# Patient Record
Sex: Male | Born: 1935 | Race: White | Hispanic: No | Marital: Married | Smoking: Former smoker
Health system: Southern US, Community
[De-identification: ages and names within clinical notes are randomized; demographics above are authoritative.]

## PROBLEM LIST (undated history)

## (undated) DIAGNOSIS — E785 Hyperlipidemia, unspecified: Secondary | ICD-10-CM

## (undated) DIAGNOSIS — Z87442 Personal history of urinary calculi: Secondary | ICD-10-CM

## (undated) DIAGNOSIS — I509 Heart failure, unspecified: Secondary | ICD-10-CM

## (undated) DIAGNOSIS — I2510 Atherosclerotic heart disease of native coronary artery without angina pectoris: Secondary | ICD-10-CM

## (undated) HISTORY — PX: HEMORRHOID SURGERY: SHX153

## (undated) HISTORY — DX: Hyperlipidemia, unspecified: E78.5

## (undated) HISTORY — PX: KIDNEY STONE SURGERY: SHX686

## (undated) HISTORY — PX: CARDIAC CATHETERIZATION: SHX172

## (undated) HISTORY — DX: Atherosclerotic heart disease of native coronary artery without angina pectoris: I25.10

---

## 2001-04-30 ENCOUNTER — Ambulatory Visit (HOSPITAL_COMMUNITY): Admission: RE | Admit: 2001-04-30 | Discharge: 2001-05-01 | Payer: Self-pay | Admitting: Cardiology

## 2001-04-30 ENCOUNTER — Encounter: Payer: Self-pay | Admitting: Cardiology

## 2002-04-14 ENCOUNTER — Emergency Department (HOSPITAL_COMMUNITY): Admission: EM | Admit: 2002-04-14 | Discharge: 2002-04-14 | Payer: Self-pay | Admitting: Emergency Medicine

## 2002-04-14 ENCOUNTER — Encounter: Payer: Self-pay | Admitting: Emergency Medicine

## 2002-04-14 ENCOUNTER — Encounter: Payer: Self-pay | Admitting: Orthopedic Surgery

## 2004-12-01 ENCOUNTER — Ambulatory Visit: Payer: Self-pay | Admitting: Family Medicine

## 2005-06-07 ENCOUNTER — Ambulatory Visit: Payer: Self-pay | Admitting: Cardiology

## 2005-06-12 ENCOUNTER — Ambulatory Visit: Payer: Self-pay

## 2005-11-12 ENCOUNTER — Ambulatory Visit: Payer: Self-pay | Admitting: Family Medicine

## 2006-06-10 ENCOUNTER — Ambulatory Visit: Payer: Self-pay | Admitting: Cardiology

## 2007-06-17 ENCOUNTER — Ambulatory Visit: Payer: Self-pay | Admitting: Cardiology

## 2008-06-17 ENCOUNTER — Ambulatory Visit: Payer: Self-pay | Admitting: Cardiology

## 2008-10-06 ENCOUNTER — Ambulatory Visit: Payer: Self-pay | Admitting: Cardiology

## 2009-02-22 DIAGNOSIS — I2510 Atherosclerotic heart disease of native coronary artery without angina pectoris: Secondary | ICD-10-CM | POA: Insufficient documentation

## 2009-02-22 DIAGNOSIS — E785 Hyperlipidemia, unspecified: Secondary | ICD-10-CM | POA: Insufficient documentation

## 2009-02-23 ENCOUNTER — Encounter: Payer: Self-pay | Admitting: Cardiology

## 2009-02-23 ENCOUNTER — Ambulatory Visit: Payer: Self-pay | Admitting: Cardiology

## 2009-02-23 DIAGNOSIS — I440 Atrioventricular block, first degree: Secondary | ICD-10-CM | POA: Insufficient documentation

## 2009-09-02 ENCOUNTER — Ambulatory Visit: Payer: Self-pay | Admitting: Cardiology

## 2010-03-16 ENCOUNTER — Ambulatory Visit: Payer: Self-pay | Admitting: Cardiology

## 2010-05-07 ENCOUNTER — Inpatient Hospital Stay (HOSPITAL_COMMUNITY): Admission: RE | Admit: 2010-05-07 | Discharge: 2010-05-13 | Payer: Self-pay | Admitting: Interventional Cardiology

## 2010-05-07 ENCOUNTER — Ambulatory Visit: Payer: Self-pay | Admitting: Internal Medicine

## 2010-05-08 ENCOUNTER — Ambulatory Visit: Payer: Self-pay | Admitting: Internal Medicine

## 2010-05-08 ENCOUNTER — Encounter: Payer: Self-pay | Admitting: Cardiology

## 2010-05-24 ENCOUNTER — Ambulatory Visit: Payer: Self-pay | Admitting: Cardiology

## 2010-05-29 ENCOUNTER — Ambulatory Visit: Payer: Self-pay | Admitting: Cardiology

## 2010-05-29 DIAGNOSIS — I2129 ST elevation (STEMI) myocardial infarction involving other sites: Secondary | ICD-10-CM | POA: Insufficient documentation

## 2010-05-30 LAB — CONVERTED CEMR LAB
BUN: 30 mg/dL — ABNORMAL HIGH (ref 6–23)
CO2: 29 meq/L (ref 19–32)
Calcium: 9.1 mg/dL (ref 8.4–10.5)
Chloride: 108 meq/L (ref 96–112)
Creatinine, Ser: 1.4 mg/dL (ref 0.4–1.5)
GFR calc non Af Amer: 51.03 mL/min (ref >60)
Glucose, Bld: 96 mg/dL (ref 70–99)
Potassium: 5 meq/L (ref 3.5–5.1)
Sodium: 139 meq/L (ref 135–145)

## 2010-06-07 ENCOUNTER — Ambulatory Visit: Payer: Self-pay | Admitting: Cardiology

## 2010-06-07 LAB — CONVERTED CEMR LAB
BUN: 27 mg/dL — ABNORMAL HIGH (ref 6–23)
CO2: 27 meq/L (ref 19–32)
Calcium: 9.2 mg/dL (ref 8.4–10.5)
Chloride: 99 meq/L (ref 96–112)
Creatinine, Ser: 1.4 mg/dL (ref 0.4–1.5)
GFR calc non Af Amer: 51.45 mL/min (ref >60)
Glucose, Bld: 93 mg/dL (ref 70–99)
Potassium: 5 meq/L (ref 3.5–5.1)
Sodium: 135 meq/L (ref 135–145)

## 2010-06-12 ENCOUNTER — Telehealth (INDEPENDENT_AMBULATORY_CARE_PROVIDER_SITE_OTHER): Payer: Self-pay | Admitting: *Deleted

## 2010-06-13 ENCOUNTER — Telehealth: Payer: Self-pay | Admitting: Cardiology

## 2010-06-13 LAB — CONVERTED CEMR LAB
BUN: 18 mg/dL (ref 6–23)
Chloride: 102 meq/L (ref 96–112)
Glucose, Bld: 132 mg/dL — ABNORMAL HIGH (ref 70–99)
Potassium: 3.6 meq/L (ref 3.5–5.1)

## 2010-06-23 ENCOUNTER — Telehealth: Payer: Self-pay | Admitting: Cardiology

## 2010-06-29 ENCOUNTER — Ambulatory Visit: Payer: Self-pay | Admitting: Cardiology

## 2010-07-03 LAB — CONVERTED CEMR LAB
BUN: 17 mg/dL (ref 6–23)
CO2: 29 meq/L (ref 19–32)
Calcium: 9.1 mg/dL (ref 8.4–10.5)
Creatinine, Ser: 1.2 mg/dL (ref 0.4–1.5)
Glucose, Bld: 86 mg/dL (ref 70–99)

## 2010-07-14 ENCOUNTER — Telehealth: Payer: Self-pay | Admitting: Cardiology

## 2010-08-03 ENCOUNTER — Ambulatory Visit: Payer: Self-pay | Admitting: Cardiology

## 2010-08-08 LAB — CONVERTED CEMR LAB
BUN: 18 mg/dL (ref 6–23)
CO2: 28 meq/L (ref 19–32)
Calcium: 9.3 mg/dL (ref 8.4–10.5)
Chloride: 106 meq/L (ref 96–112)
Creatinine, Ser: 1.3 mg/dL (ref 0.4–1.5)
GFR calc non Af Amer: 57.6 mL/min (ref >60)
Glucose, Bld: 81 mg/dL (ref 70–99)
Potassium: 3.7 meq/L (ref 3.5–5.1)
Sodium: 141 meq/L (ref 135–145)

## 2010-08-29 ENCOUNTER — Encounter: Payer: Self-pay | Admitting: Cardiology

## 2010-09-05 ENCOUNTER — Encounter: Payer: Self-pay | Admitting: Cardiology

## 2010-10-05 ENCOUNTER — Ambulatory Visit: Payer: Self-pay | Admitting: Cardiology

## 2010-12-05 NOTE — Progress Notes (Signed)
Summary: Medications  Phone Note Call from Patient Call back at [phone]   Caller: Granddaughter/Stephaine Summary of Call: Request call Initial call taken by: Judie Grieve,  June 13, 2010 3:14 PM  Follow-up for Phone Call        Left message to call back. Julieta Gutting, RN, BSN  June 13, 2010 3:36 PM  Lauren Have him stop his very low dose of metoprolol.  Thanks. TS  I spoke with Judeth Cornfield and made her aware that Dr Riley Kill would like the pt to stop Metoprolol ER and Crestor.  The pt was taking Crestor 10mg  at discharge and Dr Riley Kill would like the pt to start Simvastatin 20mg  daily (due to cost).  The pt's current medications should be Simvastatin 20mg  qhs, ASA 81mg  daily, Plavix 75mg  daily, Potassium daily and Furosemide 20mg  one-half tablet daily.  I spoke with Stou Scientist, research (physical sciences)) at [facility] and called in all new presciptions for the pt's medications.  Order for Crestor and Metoprolol ER discontinued.  Follow-up by: Julieta Gutting, RN, BSN,  June 13, 2010 4:14 PM    New/Updated Medications: POTASSIUM CHLORIDE CR 10 MEQ CR-CAPS (POTASSIUM CHLORIDE) Take one tablet by mouth daily SIMVASTATIN 20 MG TABS (SIMVASTATIN) Take one tablet by mouth daily at bedtime

## 2010-12-05 NOTE — Assessment & Plan Note (Signed)
Summary: f75m   Visit Type:  Follow-up Primary Provider:  Nyland  CC:  No complaints.  History of Present Illness: Patient is not as energetic as he wants to be.  He says he has gotten lazy.  If he gets out and does to much he gets worn down.  He tries not to be strenuous with what he does.  Labs reviewed from Dr. Rinaldo Cloud office in detail.  Denies current symptoms other than fatigue.  Problems Prior to Update: 1)  Acut Mi Oth Lat Wall Subsqt Epis Care  (ICD-410.52) 2)  Atrioventricular Block, Mobitz Type I  (ICD-426.13) 3)  Cad  (ICD-414.00) 4)  Hyperlipidemia  (ICD-272.4)  Current Medications (verified): 1)  Aspirin 81 Mg Tbec (Aspirin) .... Take One Tablet By Mouth Daily 2)  Plavix 75 Mg Tabs (Clopidogrel Bisulfate) .Marland Kitchen.. 1 Tab Once Daily 3)  Furosemide 20 Mg Tabs (Furosemide) .... Take One-Half Tablet Once Daily 4)  Potassium Chloride Cr 10 Meq Cr-Caps (Potassium Chloride) .... Take One Tablet By Mouth Daily 5)  Simvastatin 20 Mg Tabs (Simvastatin) .... Take One Tablet By Mouth Daily At Bedtime  Allergies (verified): No Known Drug Allergies  Past History:  Past Medical History: Last updated: 03-09-09 :  CAD (ICD-414.00)-Non obstructive HYPERLIPIDEMIA (ICD-272.4)  Past Surgical History: Last updated: 2009-03-09 PAST SURGERY:  Included a procedure to remove a kidney stone and a hemorrhoid procedure in the 1970s.  He had a past coronary artery angiography by Dr. Riley Kill.  Family History: Last updated: Mar 09, 2009 Mother died at age 30. Father died at age 37. He had an MI in his 72's. He had one brother with cancer. He had a sister with cancer. A brother who is 95 and had a pacemaker put in at 70.  Social History: Last updated: 2009/03/09 He is married. He has one son. He does not smoke Alcohol Use - no  Vital Signs:  Patient profile:   77 year old male Height:      66 inches Weight:      174.25 pounds BMI:     28.23 Pulse rate:   60 / minute Pulse rhythm:    irregular Resp:     18 per minute BP sitting:   125 / 69  (right arm) Cuff size:   large  Vitals Entered By: Vikki Ports (October 05, 2010 3:09 PM)  Physical Exam  General:  Well developed, well nourished, in no acute distress. Head:  normocephalic and atraumatic Eyes:  PERRLA/EOM intact; conjunctiva and lids normal. Neck:  Mild elevation in JVP. Lungs:  Clear bilaterally to auscultation and percussion. Heart:  PMI non displaced.  Normal S1 and S2.  Pos S4. Pulses:  pulses normal in all 4 extremities Extremities:  No clubbing or cyanosis.   Impression & Recommendations:  Problem # 1:  CAD (ICD-414.00) stable at present.  Would not change meds at this point.   His updated medication list for this problem includes:    Aspirin 81 Mg Tbec (Aspirin) .Marland Kitchen... Take one tablet by mouth daily    Plavix 75 Mg Tabs (Clopidogrel bisulfate) .Marland Kitchen... 1 tab once daily  Orders: EKG w/ Interpretation (93000)  Problem # 2:  HYPERLIPIDEMIA (ICD-272.4) tolerating.  Near target with LDL of 79.  Continue same. His updated medication list for this problem includes:    Simvastatin 20 Mg Tabs (Simvastatin) .Marland Kitchen... Take one tablet by mouth daily at bedtime  Patient Instructions: 1)  Your physician recommends that you schedule a follow-up appointment in: 3 months 2)  Your physician recommends that you continue on your current medications as directed. Please refer to the Current Medication list given to you today.     

## 2010-12-05 NOTE — Assessment & Plan Note (Signed)
Summary: EPH   Visit Type:  EPH Primary Provider:  Nyland  CC:  no cardiac complaints today.  History of Present Illness: Not having any chest pain.  Presented with an MI on July 4.  Feels a little sick to the stomach from time to time, and he has had a cough, and feels somewhat week.  It is mainly clearing his throat.  Does not want to go to the rehab program.  Cath films reviewed with patient and wife in detail.  No current pain.   Current Medications (verified): 1)  Aspirin Ec 325 Mg Tbec (Aspirin) .... Take One Tablet By Mouth Daily 2)  Plavix 75 Mg Tabs (Clopidogrel Bisulfate) .Marland Kitchen.. 1 Tab Once Daily 3)  Furosemide 20 Mg Tabs (Furosemide) .Marland Kitchen.. 1 Tab Once Daily 4)  Lisinopril 5 Mg Tabs (Lisinopril) .Marland Kitchen.. 1 Tab Once Daily 5)  Potassium Chloride Crys Cr 20 Meq Cr-Tabs (Potassium Chloride Crys Cr) .Marland Kitchen.. 1 Tab Once Daily 6)  Nitrostat 0.4 Mg Subl (Nitroglycerin) .Marland Kitchen.. 1 Tablet Under Tongue At Onset of Chest Pain; You May Repeat Every 5 Minutes For Up To 3 Doses. 7)  Crestor 10 Mg Tabs (Rosuvastatin Calcium) .Marland Kitchen.. 1 Tab At Bedtime 8)  Toprol Xl 25 Mg Xr24h-Tab (Metoprolol Succinate) .Marland Kitchen.. 1 Tab Once Daily  Allergies (verified): No Known Drug Allergies  Past History:  Past Medical History: Last updated: 02/22/2009 :  CAD (ICD-414.00)-Non obstructive HYPERLIPIDEMIA (ICD-272.4)  Vital Signs:  Patient profile:   77 year old male Height:      66 inches Weight:      169 pounds BMI:     27.38 Pulse rate:   46 / minute Pulse rhythm:   irregular BP sitting:   100 / 62  (left arm) Cuff size:   regular  Vitals Entered By: Danielle Rankin, CMA (May 29, 2010 10:21 AM)  Physical Exam  General:  Well developed, well nourished, in no acute distress. Head:  normocephalic and atraumatic Eyes:  PERRLA/EOM intact; conjunctiva and lids normal. Lungs:  Clear bilaterally to auscultation and percussion. Heart:  PMI non displaced.  Normal S1 and S2.  Pos S4.   Abdomen:  Bowel sounds positive; abdomen  soft and non-tender without masses, organomegaly, or hernias noted. No hepatosplenomegaly. Pulses:  pulses normal in all 4 extremities Extremities:  No clubbing or cyanosis. Neurologic:  Alert and oriented x 3.   Cardiac Cath  Procedure date:  05/08/2010  Findings:      IMPRESSION: 1. Acute anterolateral ST-segment elevation myocardial infarction     secondary to occluded ostial second diagonal and successful     percutaneous coronary intervention with bare-metal stent placed to     the ostium of the second diagonal. 2. Normal ventricular function with estimated ejection fraction of 50-     55%. 3. No abdominal aortic aneurysm. 4. Mild coronary artery disease in the other vessels.   RECOMMENDATIONS:  He should continue aspirin for life.  He will need Plavix for minimum of 30 days, but will likely benefit from taking it for a longer period of time if he tolerates it.  He will go to CCU. Continue medical therapy including statin.  Consider beta-blocker.  He only does have a prolonged PR interval.    EKG  Procedure date:  05/29/2010  Findings:      SB.  Wenkebach.  LAD.  IVCD.    Impression & Recommendations:  Problem # 1:  ACUT MI OTH LAT WALL SUBSQT EPIS CARE (ICD-410.52) Slowly getting better, but  weak at the present time.  Rate slow, and has cough, so will decrease beta blocker, and stop Lisinopril. The following medications were removed from the medication list:    Toprol Xl 25 Mg Xr24h-tab (Metoprolol succinate) .Marland Kitchen... 1 tab once daily His updated medication list for this problem includes:    Toprol Xl 25 Mg Xr24h-tab (Metoprolol succinate) .Marland Kitchen... Take one-half tablet by mouth daily    Aspirin 81 Mg Tbec (Aspirin) .Marland Kitchen... Take one tablet by mouth daily    Plavix 75 Mg Tabs (Clopidogrel bisulfate) .Marland Kitchen... 1 tab once daily  Problem # 2:  HYPERLIPIDEMIA (ICD-272.4) wants a generic medication.   Will switch to simva when he can.  His updated medication list for this problem  includes:    Crestor 10 Mg Tabs (Rosuvastatin calcium) .Marland Kitchen... 1 tab at bedtime  Orders: EKG w/ Interpretation (93000) TLB-BMP (Basic Metabolic Panel-BMET) (80048-METABOL)  Problem # 3:  ATRIOVENTRICULAR BLOCK, MOBITZ TYPE I (ICD-426.13) Will reduce Toprol to one half tab daily. The following medications were removed from the medication list:    Toprol Xl 25 Mg Xr24h-tab (Metoprolol succinate) .Marland Kitchen... 1 tab once daily His updated medication list for this problem includes:    Toprol Xl 25 Mg Xr24h-tab (Metoprolol succinate) .Marland Kitchen... Take one-half tablet by mouth daily    Aspirin 81 Mg Tbec (Aspirin) .Marland Kitchen... Take one tablet by mouth daily    Plavix 75 Mg Tabs (Clopidogrel bisulfate) .Marland Kitchen... 1 tab once daily  Orders: EKG w/ Interpretation (93000) TLB-BMP (Basic Metabolic Panel-BMET) (80048-METABOL)  Patient Instructions: 1)  Your physician recommends that you schedule a follow-up appointment in: 1 WEEK 2)  Your physician recommends that you have lab work today: BMP 3)  Your physician has recommended you make the following change in your medication: When you finish your current supply of Crestor we want to switch you to Simvastatin 20mg  once a day, STOP Lisinopril, DECREASE Aspirin to 81mg  once a day, DECREASE Metoprolol ER (Toprol XL) to 12.5mg  once a day, DECREASE Potassium to once a day

## 2010-12-05 NOTE — Progress Notes (Signed)
Summary: status of paperwork  Phone Note Call from Patient Call back at Home Phone [phone]   Caller: grand-dtr- tiffany Reason for Call: Talk to Nurse Summary of Call: paperwork was drop off at office during last office visit.  sickness report.  Initial call taken by: Lorne Skeens,  July 14, 2010 2:51 PM  Follow-up for Phone Call        Pt had questioned regarding his insurance. Transfered to billing so they could further assist him. Whitney Maeola Sarah RN  July 14, 2010 3:13 PM

## 2010-12-05 NOTE — Assessment & Plan Note (Signed)
Summary: f31m   Visit Type:  1 month follow up Primary Provider:  Nyland  CC:  Some Sob when walking.  History of Present Illness: THinks he is doing alot better.  No chest pain at the present time.  He has some shortness of breath.  Overall, he wants to do more, but does admit to some mild shortness of breath at baseline.    Current Medications (verified): 1)  Aspirin 81 Mg Tbec (Aspirin) .... Take One Tablet By Mouth Daily 2)  Plavix 75 Mg Tabs (Clopidogrel Bisulfate) .Marland Kitchen.. 1 Tab Once Daily 3)  Furosemide 20 Mg Tabs (Furosemide) .... Take One-Half Tablet Once Daily 4)  Potassium Chloride Cr 10 Meq Cr-Caps (Potassium Chloride) .... Take One Tablet By Mouth Daily 5)  Simvastatin 20 Mg Tabs (Simvastatin) .... Take One Tablet By Mouth Daily At Bedtime  Allergies (verified): No Known Drug Allergies  Vital Signs:  Patient profile:   77 year old male Height:      66 inches Weight:      173.25 pounds BMI:     28.06 Pulse rate:   60 / minute Pulse rhythm:   irregular Resp:     18 per minute BP sitting:   114 / 70  (right arm) Cuff size:   large  Vitals Entered By: Vikki Ports (August 03, 2010 3:48 PM)  Physical Exam  General:  Well developed, well nourished, in no acute distress. Head:  normocephalic and atraumatic Lungs:  Clear to ausculation and percussion. Heart:  Normal S1 and S2.  Pos S4.     Pulses:  pulses normal in all 4 extremities Extremities:  No clubbing or cyanosis.  No edema Neurologic:  Alert and oriented x 3.   EKG  Procedure date:  08/03/2010  Findings:      NSR with first degree AV block.  No real definite change in since last tracing.  PR is slightly less than on the prior tracing.   Impression & Recommendations:  Problem # 1:  CAD (ICD-414.00) He remains currently stable.  Not a candidate for beta blockers because of marked first degree av block.  He is not having angina at present.  Did have some hemodynamic issues with mild CHF after MI which  was a large diagonal.  Will continue current regimen, and liberalize activity mildly, although he was cautioned about over activity.  Wife is in attendance.  His updated medication list for this problem includes:    Aspirin 81 Mg Tbec (Aspirin) .Marland Kitchen... Take one tablet by mouth daily    Plavix 75 Mg Tabs (Clopidogrel bisulfate) .Marland Kitchen... 1 tab once daily  Orders: EKG w/ Interpretation (93000) TLB-BMP (Basic Metabolic Panel-BMET) (80048-METABOL)  Problem # 2:  ATRIOVENTRICULAR BLOCK, MOBITZ TYPE I (ICD-426.13) Beta blockers remain on hold.  No need for pacing at the present time.  His updated medication list for this problem includes:    Aspirin 81 Mg Tbec (Aspirin) .Marland Kitchen... Take one tablet by mouth daily    Plavix 75 Mg Tabs (Clopidogrel bisulfate) .Marland Kitchen... 1 tab once daily  Problem # 3:  HYPERLIPIDEMIA (ICD-272.4) Tolerating low dose statins without side effects.   His updated medication list for this problem includes:    Simvastatin 20 Mg Tabs (Simvastatin) .Marland Kitchen... Take one tablet by mouth daily at bedtime  Patient Instructions: 1)  Your physician recommends that you schedule a follow-up appointment in: 2 MONTHS 2)  Your physician recommends that you have lab work today: BMP 3)  Your physician recommends that you  continue on your current medications as directed. Please refer to the Current Medication list given to you today.

## 2010-12-05 NOTE — Progress Notes (Signed)
Summary: refill  Phone Note Refill Request Message from:  Patient on June 12, 2010 4:42 PM  Refills Requested: Medication #1:  PLAVIX 75 MG TABS 1 tab once daily  Medication #2:  CRESTOR 10 MG TABS 1 tab at bedtime.  Medication #3:  FUROSEMIDE 20 MG TABS 1 tab once daily Send to [facility] [phone]  Initial call taken by: Judie Grieve,  June 12, 2010 4:43 PM  Follow-up for Phone Call        Rx called to pharmacy # 30 plus 6 refills on each one. Vikki Ports  June 13, 2010 8:41 AM       Appended Document: refill I called and spoke with the pharmacist.  Order for Crestor discontinued.  New instructions were given to the pt's grand-daughter about all medications.

## 2010-12-05 NOTE — Assessment & Plan Note (Signed)
Summary: f1w   Visit Type:  Follow-up Primary Kiandria Clum:  Nyland  CC:  no complaints.  History of Present Illness: Mark Lane is a 77 yo gentle man with recent admission for acute STEMI treated with BMS in diagonal branch. He is here for a follow up appointment. His medciations were adjusted last visit due to bradycardia and also he was not taking his Crestor at that time.  H eis taking his Crestor now and his HR is 58 today. No CP, SOB, orthopnea or PND.  Problems Prior to Update: 1)  Acut Mi Oth Lat Wall Subsqt Epis Care  (ICD-410.52) 2)  Atrioventricular Block, Mobitz Type I  (ICD-426.13) 3)  Cad  (ICD-414.00) 4)  Hyperlipidemia  (ICD-272.4)  Medications Prior to Update: 1)  Toprol Xl 25 Mg Xr24h-Tab (Metoprolol Succinate) .... Take One-Half Tablet By Mouth Daily 2)  Aspirin 81 Mg Tbec (Aspirin) .... Take One Tablet By Mouth Daily 3)  Plavix 75 Mg Tabs (Clopidogrel Bisulfate) .Marland Kitchen.. 1 Tab Once Daily 4)  Furosemide 20 Mg Tabs (Furosemide) .Marland Kitchen.. 1 Tab Once Daily 5)  Potassium Chloride Crys Cr 20 Meq Cr-Tabs (Potassium Chloride Crys Cr) .... Take One-Half Tablet Daily 6)  Crestor 10 Mg Tabs (Rosuvastatin Calcium) .Marland Kitchen.. 1 Tab At Bedtime  Current Medications (verified): 1)  Toprol Xl 25 Mg Xr24h-Tab (Metoprolol Succinate) .... Take One-Half Tablet By Mouth Daily 2)  Aspirin 81 Mg Tbec (Aspirin) .... Take One Tablet By Mouth Daily 3)  Plavix 75 Mg Tabs (Clopidogrel Bisulfate) .Marland Kitchen.. 1 Tab Once Daily 4)  Furosemide 20 Mg Tabs (Furosemide) .... Take One-Half Tablet Once Daily 5)  Potassium Chloride Crys Cr 20 Meq Cr-Tabs (Potassium Chloride Crys Cr) .... Take One-Half Tablet By Mouth Every Other Day 6)  Crestor 10 Mg Tabs (Rosuvastatin Calcium) .Marland Kitchen.. 1 Tab At Bedtime  Allergies (verified): No Known Drug Allergies  Past History:  Past Medical History: Last updated: 2009-03-02 :  CAD (ICD-414.00)-Non obstructive HYPERLIPIDEMIA (ICD-272.4)  Family History: Last updated:  Mar 02, 2009 Mother died at age 38. Father died at age 75. He had an MI in his 40's. He had one brother with cancer. He had a sister with cancer. A brother who is 95 and had a pacemaker put in at 46.  Family History: Reviewed history from 02-Mar-2009 and no changes required. Mother died at age 24. Father died at age 46. He had an MI in his 21's. He had one brother with cancer. He had a sister with cancer. A brother who is 95 and had a pacemaker put in at 43.  Social History: Reviewed history from 03-02-09 and no changes required. He is married. He has one son. He does not smoke Alcohol Use - no  Review of Systems      See HPI  Vital Signs:  Patient profile:   77 year old male Height:      66 inches Weight:      168 pounds BMI:     27.21 Pulse rate:   58 / minute BP sitting:   122 / 67  (left arm) Cuff size:   regular  Vitals Entered By: Burnett Kanaris, CNA (June 07, 2010 4:29 PM)  Physical Exam  Additional Exam:  Gen: AOx3, in no acute distress Eyes: PERRL, EOMI ENT:MMM, No erythema noted in posterior pharynx Neck: No JVD, No LAP Chest: CTAB with  good respiratory effort CVS: regular rhythmic rate, NO M/R/G, S1 S2 normal Abdo: soft,ND, BS+x4, Non tender and No hepatosplenomegaly EXT: No odema noted  EKG  Procedure date:  06/07/2010  Findings:      Unchanged from previous EKG.  Impression & Recommendations:  Problem # 1:  CAD (ICD-414.00) Assessment Comment Only Continue current meds. he is walking and tolerating it pretty well. Says that he is close to his baseline before he had lateral MI.  Avoid driving. Avoid heavy physical labour. RTC in 3-4 weeks.  His updated medication list for this problem includes:    Toprol Xl 25 Mg Xr24h-tab (Metoprolol succinate) .Marland Kitchen... Take one-half tablet by mouth daily    Aspirin 81 Mg Tbec (Aspirin) .Marland Kitchen... Take one tablet by mouth daily    Plavix 75 Mg Tabs (Clopidogrel bisulfate) .Marland Kitchen... 1 tab once daily  Orders: TLB-BMP  (Basic Metabolic Panel-BMET) (80048-METABOL)  Problem # 2:  HYPERLIPIDEMIA (ICD-272.4) Assessment: Unchanged He requested to change his Crestoer to something cheaper. His updated medication list for this problem includes:    Simvastatin 40 Mg Tabs (Simvastatin) .Marland Kitchen... Take 1 tablet by mouth once a day at bed time.  Problem # 3:  ACUT MI OTH LAT WALL SUBSQT EPIS CARE (ICD-410.52) Assessment: Improved Pt had some volume overload.  Will decrease furosemide and also K per orders.    His updated medication list for this problem includes:    Toprol Xl 25 Mg Xr24h-tab (Metoprolol succinate) .Marland Kitchen... Take one-half tablet by mouth daily    Aspirin 81 Mg Tbec (Aspirin) .Marland Kitchen... Take one tablet by mouth daily    Plavix 75 Mg Tabs (Clopidogrel bisulfate) .Marland Kitchen... 1 tab once daily  Orders: TLB-BMP (Basic Metabolic Panel-BMET) (80048-METABOL)  Cardiac Cath:  IMPRESSION:   1. Acute anterolateral ST-segment elevation myocardial infarction       secondary to occluded ostial second diagonal and successful       percutaneous coronary intervention with bare-metal stent placed to       the ostium of the second diagonal.   2. Normal ventricular function with estimated ejection fraction of 50-       55%.   3. No abdominal aortic aneurysm.   4. Mild coronary artery disease in the other vessels.      RECOMMENDATIONS:  He should continue aspirin for life.  He will need   Plavix for minimum of 30 days, but will likely benefit from taking it   for a longer period of time if he tolerates it.  He will go to CCU.   Continue medical therapy including statin.  Consider beta-blocker.  He   only does have a prolonged PR interval.               Corky Crafts, MD         (05/08/2010) BUN: 27 (05/29/2010)   Creat: 1.4 (05/29/2010)   Glucose: 93 (05/29/2010) Na+: 135 (05/29/2010)   K+: 5.0 (05/29/2010)   Cl: 99 (05/29/2010)     Patient Instructions: 1)  Your physician recommends that you have lab work today:  BMP 2)  Your physician recommends that you schedule a follow-up appointment in: 1 MONTH 3)  Your physician has recommended you make the following change in your medication: DECREASE Furosemide to 20mg  one-half tablet daily, DECREASE  potassium to  every other day Prescriptions: SIMVASTATIN 40 MG TABS (SIMVASTATIN) Take 1 tablet by mouth once a day at bed time.  #30 x 11   Entered by:   Judie Grieve   Authorized by:   Ronaldo Miyamoto, MD, [facility]   Signed by:   Ronaldo Miyamoto, MD, [facility] on 06/12/2010  Method used:   Print then Give to Patient   RxID:   [id number]   Appended Document: f1w/change cholesterol med and schedule labs/lwb ECG shows rate faster, approx 60.  There is marked first degree AB block, and possibly Wenkebach.  Stable, and faster.   Appended Document: f1w Rx is incorrect and was not given to the pt. This was entered in error by Judie Grieve. The pt has been given instructions to start Simvastatin 20mg  daily.

## 2010-12-05 NOTE — Assessment & Plan Note (Signed)
Summary: f56m   Visit Type:  6 months follow up Primary Provider:  Nyland  CC:  Shoulders and neck and arms soreness.  History of Present Illness: Overall doing well.  No chest pain.  Feels great.  No syncope or presycnope.  Staying active. Shoulders bother him at time.  .  Current Medications (verified): 1)  Patient Does Not Take Any Medications  Allergies (verified): No Known Drug Allergies  Vital Signs:  Patient profile:   77 year old male Height:      66 inches Weight:      173.50 pounds BMI:     28.10 Pulse rate:   47 / minute Pulse rhythm:   irregular Resp:     18 per minute BP sitting:   136 / 68  (left arm) Cuff size:   large  Vitals Entered By: Vikki Ports (Mar 16, 2010 12:31 PM)  Physical Exam  General:  Well developed, well nourished, in no acute distress. Head:  normocephalic and atraumatic Eyes:  PERRLA/EOM intact; conjunctiva and lids normal. Lungs:  Clear bilaterally to auscultation and percussion. Heart:  PMI non displaced.  Normal S1 and S2.  No significant murmur.  Abdomen:  Bowel sounds positive; abdomen soft and non-tender without masses, organomegaly, or hernias noted. No hepatosplenomegaly. Extremities:  No clubbing or cyanosis. Neurologic:  Alert and oriented x 3.   EKG  Procedure date:  03/16/2010  Findings:      SB.  First degree AV block.  ?second degree.   Borderline IVCD.   Impression & Recommendations:  Problem # 1:  CAD (ICD-414.00)  stable on no medications at present.   Orders: EKG w/ Interpretation (93000)  Problem # 2:  HYPERLIPIDEMIA (ICD-272.4)  chosses no statins.   Orders: EKG w/ Interpretation (93000)  Problem # 3:  ATRIOVENTRICULAR BLOCK, MOBITZ TYPE I (ICD-426.13)  History.  No symptoms.  ECG reviewed with Dr. Johney Frame.  Based on lack of symptoms, good functional status, watching waiting.  Need for eventual pacing discussed with patient.   Orders: EKG w/ Interpretation (93000)  Patient Instructions: 1)   Your physician wants you to follow-up in: 6 MONTHS.  You will receive a reminder letter in the mail two months in advance. If you don't receive a letter, please call our office to schedule the follow-up appointment.

## 2010-12-05 NOTE — Assessment & Plan Note (Signed)
Summary: f79m   Visit Type:  1 month follow up Primary Provider:  Nyland   History of Present Illness: Patient would like to eat more.  He is not having chest pain.  Sometimes with walking will get short winded and some burning.  Current Medications (verified): 1)  Aspirin 81 Mg Tbec (Aspirin) .... Take One Tablet By Mouth Daily 2)  Plavix 75 Mg Tabs (Clopidogrel Bisulfate) .Marland Kitchen.. 1 Tab Once Daily 3)  Furosemide 20 Mg Tabs (Furosemide) .... Take One-Half Tablet Once Daily 4)  Potassium Chloride Cr 10 Meq Cr-Caps (Potassium Chloride) .... Take One Tablet By Mouth Daily 5)  Simvastatin 20 Mg Tabs (Simvastatin) .... Take One Tablet By Mouth Daily At Bedtime  Allergies (verified): No Known Drug Allergies  Vital Signs:  Patient profile:   77 year old male Height:      66 inches Weight:      167.75 pounds BMI:     27.17 Pulse rate:   57 / minute Pulse rhythm:   regular Resp:     18 per minute BP sitting:   108 / 60  (left arm) Cuff size:   large  Vitals Entered By: Vikki Ports (June 29, 2010 3:31 PM)  Physical Exam  General:  Well developed, well nourished, in no acute distress. Head:  normocephalic and atraumatic Eyes:  PERRLA/EOM intact; conjunctiva and lids normal. Chest Wall:  small tick bite on left shoulder.  Lungs:  No rales at present.   Heart:  PMI non displaced.  Normal S1 and S2.  Pos S4.   Abdomen:  Bowel sounds positive; abdomen soft and non-tender without masses, organomegaly, or hernias noted. No hepatosplenomegaly. Msk:  Back normal, normal gait. Muscle strength and tone normal. Pulses:  pulses normal in all 4 extremities Extremities:  No clubbing or cyanosis. Neurologic:  Alert and oriented x 3.   EKG  Procedure date:  06/29/2010  Findings:      SB  first degree av block.  anterior MI, oold.  ST and Tchanges, chronic  Impression & Recommendations:  Problem # 1:  ACUT MI OTH LAT WALL SUBSQT EPIS CARE (ICD-410.52) He remains a little weak, but  overall doing pretty well.  He is minimally short of breath, and overall activity has improved.  He wants to do alot more, and we will turn him lose to do a bit more at this point.  Denies chest pain.  Meds carefully reviewed.  We will need to keep regimen simple for him. His updated medication list for this problem includes:    Aspirin 81 Mg Tbec (Aspirin) .Marland Kitchen... Take one tablet by mouth daily    Plavix 75 Mg Tabs (Clopidogrel bisulfate) .Marland Kitchen... 1 tab once daily  His updated medication list for this problem includes:    Aspirin 81 Mg Tbec (Aspirin) .Marland Kitchen... Take one tablet by mouth daily    Plavix 75 Mg Tabs (Clopidogrel bisulfate) .Marland Kitchen... 1 tab once daily  Orders: EKG w/ Interpretation (93000) TLB-BMP (Basic Metabolic Panel-BMET) (80048-METABOL)  Problem # 2:  CAD (ICD-414.00) stable  His updated medication list for this problem includes:    Aspirin 81 Mg Tbec (Aspirin) .Marland Kitchen... Take one tablet by mouth daily    Plavix 75 Mg Tabs (Clopidogrel bisulfate) .Marland Kitchen... 1 tab once daily  Orders: EKG w/ Interpretation (93000) TLB-BMP (Basic Metabolic Panel-BMET) (80048-METABOL)  Problem # 3:  HYPERLIPIDEMIA (ICD-272.4)  tolerating medication at present. Will eventually need labs checked. His updated medication list for this problem includes:    Simvastatin  20 Mg Tabs (Simvastatin) .Marland Kitchen... Take one tablet by mouth daily at bedtime  His updated medication list for this problem includes:    Simvastatin 20 Mg Tabs (Simvastatin) .Marland Kitchen... Take one tablet by mouth daily at bedtime  Patient Instructions: 1)  Your physician recommends that you schedule a follow-up appointment in: 1 month 2)  Your physician recommends that you have lab work today.: BMP 3)  Your physician recommends that you continue on your current medications as directed. Please refer to the Current Medication list given to you today.

## 2010-12-05 NOTE — Progress Notes (Signed)
Summary: Refill  Phone Note Call from Patient   Caller: Stephanie-grand-daughter Call For: Lauren Summary of Call: The pt needs a Rx for potassium sent into his pharmacy.  I spoke with the pharmacy and they will fill potassium.  Initial call taken by: Julieta Gutting, RN, BSN,  June 23, 2010 4:04 PM    Prescriptions: POTASSIUM CHLORIDE CR 10 MEQ CR-CAPS (POTASSIUM CHLORIDE) Take one tablet by mouth daily  #30 x 6   Entered by:   Julieta Gutting, RN, BSN   Authorized by:   Ronaldo Miyamoto, MD, [facility]   Signed by:   Julieta Gutting, RN, BSN on 06/23/2010   Method used:   Electronically to        [facility]* (retail)       [address]       [place], [place]  [address]       Ph: [phone]       Fax:    RxID:   [id number]

## 2010-12-07 NOTE — Letter (Signed)
Summary: [facility] Office Visit   [facility] Office Visit   Imported By: Roderic Ovens 10/24/2010 16:23:49  _____________________________________________________________________  External Attachment:    Type:   Image     Comment:   External Document

## 2011-01-12 ENCOUNTER — Encounter: Payer: Self-pay | Admitting: Cardiology

## 2011-01-12 ENCOUNTER — Other Ambulatory Visit: Payer: Self-pay | Admitting: Cardiology

## 2011-01-12 ENCOUNTER — Ambulatory Visit (INDEPENDENT_AMBULATORY_CARE_PROVIDER_SITE_OTHER): Payer: Medicare Other | Admitting: Cardiology

## 2011-01-12 DIAGNOSIS — I441 Atrioventricular block, second degree: Secondary | ICD-10-CM

## 2011-01-12 DIAGNOSIS — I2510 Atherosclerotic heart disease of native coronary artery without angina pectoris: Secondary | ICD-10-CM

## 2011-01-12 DIAGNOSIS — E7800 Pure hypercholesterolemia, unspecified: Secondary | ICD-10-CM

## 2011-01-12 DIAGNOSIS — E785 Hyperlipidemia, unspecified: Secondary | ICD-10-CM

## 2011-01-12 LAB — BASIC METABOLIC PANEL
BUN: 19 mg/dL (ref 6–23)
CO2: 31 mEq/L (ref 19–32)
Chloride: 104 mEq/L (ref 96–112)
Creatinine, Ser: 1.3 mg/dL (ref 0.4–1.5)

## 2011-01-12 NOTE — Progress Notes (Signed)
HPI:  Patient is getting along reasonably well.  His wife thinks he does a bit too much.   He denies chest pain or syncope.  He likes to continue to work, and still does some driving.  Otherwise, no changes at present.    Current Outpatient Prescriptions on File Prior to Visit  Medication Sig Dispense Refill  . aspirin 81 MG tablet Take 81 mg by mouth daily.        . clopidogrel (PLAVIX) 75 MG tablet Take 75 mg by mouth daily.        . furosemide (LASIX) 20 MG tablet Take 1/2 tablet once daily      . potassium chloride (KLOR-CON) 10 MEQ CR tablet Take 10 mEq by mouth daily.        . simvastatin (ZOCOR) 20 MG tablet Take 20 mg by mouth at bedtime.          Past Medical History  Diagnosis Date  . Hyperlipidemia   . Coronary artery disease     Past Surgical History  Procedure Date  . Kidney stone surgery     remove a kidney stone  . Hemorrhoid surgery     removed a hemorrhoid in the 1970s    No family history on file.  History   Social History  . Marital Status: Married    Spouse Name: N/A    Number of Children: N/A  . Years of Education: N/A   Occupational History  . Not on file.   Social History Main Topics  . Smoking status: Never Smoker   . Smokeless tobacco: Never Used  . Alcohol Use: No  . Drug Use: No  . Sexually Active: Not on file   Other Topics Concern  . Not on file   Social History Narrative   He is married, has 1-son. He does not smoke, or use alcohol   As stated in the HPI.  All other ROS negative.  BP 136/70  Pulse 54  Resp 18  Ht 5\' 6"  (1.676 m)  Wt 175 lb (79.379 kg)  BMI 28.25 kg/m2  General: Well developed, well nourished, in no acute distress. Head:  Normocephalic and atraumatic. Lungs: Clear to auscultation and percussion. Abdomen:  No HS megaly.   Heart: Normal S1 and S2.  No murmur, rubs or gallops.  Positive S4. .  Pulses: Pulses normal in all 4 extremities. Extremities: No clubbing or cyanosis. No edema. Neurologic: Alert and  oriented x 3.  EKG:  SB.  IVCD.Marland Kitchen Inferior MI, old.

## 2011-01-12 NOTE — Assessment & Plan Note (Signed)
Managed by Dr. Evlyn Kanner at the present time.

## 2011-01-12 NOTE — Assessment & Plan Note (Signed)
Continues to remain stable.  No changes at present.

## 2011-01-12 NOTE — Assessment & Plan Note (Signed)
Sligtly longer, but not significant.  No syncope.

## 2011-01-21 LAB — CBC
HCT: 37.8 % — ABNORMAL LOW (ref 39.0–52.0)
HCT: 40 % (ref 39.0–52.0)
Hemoglobin: 11.8 g/dL — ABNORMAL LOW (ref 13.0–17.0)
Hemoglobin: 12.9 g/dL — ABNORMAL LOW (ref 13.0–17.0)
Hemoglobin: 13.3 g/dL (ref 13.0–17.0)
MCH: 30.2 pg (ref 26.0–34.0)
MCH: 30.3 pg (ref 26.0–34.0)
MCHC: 33.4 g/dL (ref 30.0–36.0)
MCHC: 33.4 g/dL (ref 30.0–36.0)
MCHC: 33.5 g/dL (ref 30.0–36.0)
MCHC: 33.9 g/dL (ref 30.0–36.0)
MCV: 90.5 fL (ref 78.0–100.0)
MCV: 90.6 fL (ref 78.0–100.0)
MCV: 90.7 fL (ref 78.0–100.0)
Platelets: 153 10*3/uL (ref 150–400)
RDW: 13.4 % (ref 11.5–15.5)
WBC: 11.6 10*3/uL — ABNORMAL HIGH (ref 4.0–10.5)
WBC: 9.9 10*3/uL (ref 4.0–10.5)

## 2011-01-21 LAB — BASIC METABOLIC PANEL
CO2: 25 mEq/L (ref 19–32)
CO2: 26 mEq/L (ref 19–32)
CO2: 28 mEq/L (ref 19–32)
CO2: 30 mEq/L (ref 19–32)
Calcium: 8.3 mg/dL — ABNORMAL LOW (ref 8.4–10.5)
Calcium: 8.5 mg/dL (ref 8.4–10.5)
Calcium: 8.7 mg/dL (ref 8.4–10.5)
Calcium: 8.9 mg/dL (ref 8.4–10.5)
Creatinine, Ser: 1.11 mg/dL (ref 0.4–1.5)
Creatinine, Ser: 1.27 mg/dL (ref 0.4–1.5)
GFR calc Af Amer: >60 mL/min (ref >60)
GFR calc non Af Amer: >60 mL/min (ref >60)
Glucose, Bld: 101 mg/dL — ABNORMAL HIGH (ref 70–99)
Glucose, Bld: 105 mg/dL — ABNORMAL HIGH (ref 70–99)
Glucose, Bld: 108 mg/dL — ABNORMAL HIGH (ref 70–99)
Glucose, Bld: 108 mg/dL — ABNORMAL HIGH (ref 70–99)
Glucose, Bld: 110 mg/dL — ABNORMAL HIGH (ref 70–99)
Potassium: 3.4 mEq/L — ABNORMAL LOW (ref 3.5–5.1)
Sodium: 133 mEq/L — ABNORMAL LOW (ref 135–145)
Sodium: 138 mEq/L (ref 135–145)
Sodium: 138 mEq/L (ref 135–145)
Sodium: 140 mEq/L (ref 135–145)

## 2011-01-21 LAB — DIFFERENTIAL
Lymphocytes Relative: 8 % — ABNORMAL LOW (ref 12–46)
Lymphs Abs: 0.8 10*3/uL (ref 0.7–4.0)
Monocytes Absolute: 0.2 10*3/uL (ref 0.1–1.0)
Monocytes Relative: 2 % — ABNORMAL LOW (ref 3–12)
Neutro Abs: 8.9 10*3/uL — ABNORMAL HIGH (ref 1.7–7.7)
Neutrophils Relative %: 90 % — ABNORMAL HIGH (ref 43–77)

## 2011-01-21 LAB — MRSA PCR SCREENING: MRSA by PCR: NEGATIVE

## 2011-01-21 LAB — POCT I-STAT 3, ART BLOOD GAS (G3+)
O2 Saturation: 96 %
TCO2: 25 mmol/L (ref 0–100)
pH, Arterial: 7.318 — ABNORMAL LOW (ref 7.350–7.450)

## 2011-01-21 LAB — HEMOGLOBIN A1C: Mean Plasma Glucose: 126 mg/dL — ABNORMAL HIGH (ref <117)

## 2011-01-21 LAB — CK TOTAL AND CKMB (NOT AT ARMC)
CK, MB: 187.9 ng/mL (ref 0.3–4.0)
CK, MB: 198.7 ng/mL (ref 0.3–4.0)
CK, MB: 84.4 ng/mL (ref 0.3–4.0)
Relative Index: 16.2 — ABNORMAL HIGH (ref 0.0–2.5)
Relative Index: 16.6 — ABNORMAL HIGH (ref 0.0–2.5)
Relative Index: 18.6 — ABNORMAL HIGH (ref 0.0–2.5)
Total CK: 1012 U/L — ABNORMAL HIGH (ref 7–232)

## 2011-01-21 LAB — LIPID PANEL
Cholesterol: 177 mg/dL (ref 0–200)
LDL Cholesterol: 115 mg/dL — ABNORMAL HIGH (ref 0–99)
LDL Cholesterol: 122 mg/dL — ABNORMAL HIGH (ref 0–99)
Total CHOL/HDL Ratio: 4.3 RATIO
VLDL: 16 mg/dL (ref 0–40)

## 2011-01-21 LAB — POCT I-STAT, CHEM 8
BUN: 11 mg/dL (ref 6–23)
Calcium, Ion: 1.2 mmol/L (ref 1.12–1.32)
Creatinine, Ser: 1 mg/dL (ref 0.4–1.5)
Hemoglobin: 13.6 g/dL (ref 13.0–17.0)
Sodium: 141 mEq/L (ref 135–145)
TCO2: 23 mmol/L (ref 0–100)

## 2011-01-21 LAB — COMPREHENSIVE METABOLIC PANEL
Albumin: 3.5 g/dL (ref 3.5–5.2)
BUN: 12 mg/dL (ref 6–23)
Calcium: 8.3 mg/dL — ABNORMAL LOW (ref 8.4–10.5)
Glucose, Bld: 138 mg/dL — ABNORMAL HIGH (ref 70–99)
Potassium: 4.9 mEq/L (ref 3.5–5.1)
Total Protein: 6.3 g/dL (ref 6.0–8.3)

## 2011-01-21 LAB — MAGNESIUM: Magnesium: 2 mg/dL (ref 1.5–2.5)

## 2011-01-21 LAB — PROTIME-INR
INR: 1.52 — ABNORMAL HIGH (ref 0.00–1.49)
Prothrombin Time: 18.2 seconds — ABNORMAL HIGH (ref 11.6–15.2)

## 2011-01-21 LAB — TROPONIN I
Troponin I: 12.5 ng/mL (ref 0.00–0.06)
Troponin I: 2.25 ng/mL (ref 0.00–0.06)

## 2011-02-01 NOTE — Assessment & Plan Note (Signed)
Summary: ROV   Visit Type:  3 mo follow up Primary Provider:  Nyland  CC:  indigestion and pt has no other complaints..  History of Present Illness: OFFICE NOTE completed in EPIC, please see scanned in EPIC document.   Current Medications (verified): 1)  Aspirin 81 Mg Tbec (Aspirin) .... Take One Tablet By Mouth Daily 2)  Plavix 75 Mg Tabs (Clopidogrel Bisulfate) .Marland Kitchen.. 1 Tab Once Daily 3)  Furosemide 20 Mg Tabs (Furosemide) .... Take One-Half Tablet Once Daily 4)  Potassium Chloride Cr 10 Meq Cr-Caps (Potassium Chloride) .... Take One Tablet By Mouth Daily 5)  Simvastatin 20 Mg Tabs (Simvastatin) .... Take One Tablet By Mouth Daily At Bedtime  Allergies (verified): No Known Drug Allergies  Past History:  Past Medical History: Last updated: 02/22/2009 :  CAD (ICD-414.00)-Non obstructive HYPERLIPIDEMIA (ICD-272.4)  Vital Signs:  Patient profile:   77 year old male Height:      66 inches Weight:      175.50 pounds BMI:     28.43 Pulse rate:   54 / minute Resp:     18 per minute BP sitting:   136 / 70  (left arm) Cuff size:   large  Vitals Entered By: Celestia Khat, CMA (January 12, 2011 1:51 PM)   Other Orders: EKG w/ Interpretation (93000) TLB-BMP (Basic Metabolic Panel-BMET) (80048-METABOL)  Patient Instructions: 1)  Your physician recommends that you have lab work today: BMP 2)  Your physician recommends that you continue on your current medications as directed. Please refer to the Current Medication list given to you today. 3)  Your physician wants you to follow-up in:  4 MONTHS.  You will receive a reminder letter in the mail two months in advance. If you don't receive a letter, please call our office to schedule the follow-up appointment.  Appended Document: ROV Note done in EPIC format.  Please see EPIC.  TS

## 2011-02-01 NOTE — Progress Notes (Signed)
Summary: [facility] Office Note  [facility] Office Note   Imported By: Marylou Mccoy 01/15/2011 09:45:50  _____________________________________________________________________  External Attachment:    Type:   Image     Comment:   External Document

## 2011-02-20 ENCOUNTER — Other Ambulatory Visit: Payer: Self-pay | Admitting: Cardiology

## 2011-03-20 NOTE — Assessment & Plan Note (Signed)
[facility]                            CARDIOLOGY OFFICE NOTE   Mark Lane, Mark Lane                       MRN:          [id number]  DATE:06/17/2007                            DOB:          Mar 04, 1922    PRIMARY CARDIOLOGIST:  Arturo Morton. Riley Kill, MD, [facility]   PRIMARY CARE PHYSICIAN:  Tera Mater. Evlyn Kanner, M.D.   PATIENT PROFILE:  An 77 year old Caucasian male with history of non-  obstructive coronary artery disease who presents for yearly followup.   PROBLEM LIST:  1. Non-obstructive coronary artery disease.      a.     June 2002, cardiac catheterization revealing a 70-75%       proximal diagonal lesion, otherwise, non-obstructive disease and       an ejection fraction of 71%.      b.     June 12, 2001, stress Myoview. Exercise time 4 minutes,       maximal heart rate 144 beats per minute. No evidence of ischemia       or infarct.  2. Hyperlipidemia.      a.     LDL 151, HDL 37, triglycerides 86, total cholesterol 205.       These were from April 25, 2005.   HISTORY OF PRESENT ILLNESS:  An 77 year old Caucasian male with the  above problems. He presents for yearly followup. He has remained very  active in [place] and this is an especially busy time of year.  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 He denies any PND or  orthopnea, dizziness, syncope, edema or early satiety. He was recently  prescribed simvastatin for elevated lipids, but has not yet filled that  prescription. He intermittently takes aspirin therapy when he remembers.   HOME MEDICATIONS:  1. Aspirin 81 mg, when remembers.  2. Vitamin C, when remembers.   PHYSICAL EXAMINATION:  Blood pressure 139/60, heart rate 55,  respirations 16. Weight is 178 pounds, down 5 pounds since last visit.  Pleasant, white male in no acute distress. Awake, alert and oriented x3.  NECK: No bruits or JVD.  LUNGS:   Respirations regular and unlabored, clear to auscultation.  CARDIAC: Regular S1, S2. No S3, S4 or murmurs.  ABDOMEN: Round, soft and nontender. Nondistended. Bowel sounds present  x4.  EXTREMITIES: Warm, dry and pink. No clubbing, cyanosis or edema.  Dorsalis pedis, posterior tibial pulses 2+ and equal bilaterally.   ACCESSORY CLINICAL FINDINGS:  EKG shows sinus bradycardia with a first-  degree AV block and poor R-wave progression.   ASSESSMENT/PLAN:  1. Coronary artery disease, non-obstructive and medically managed per      previous catheterization. The patient is encouraged to take his      aspirin and also to initiate statin therapy as prescribed by Dr.      Evlyn Kanner.  2. Hyperlipidemia. Plan to initiate simvastatin as prescribed.  3. Disposition. The patient can followup in one year or sooner if      necessary.      [name], ANP  Electronically Signed      Everardo Beals Juanda Chance, MD, [facility]  Electronically Signed   CB/MedQ  DD: 06/17/2007  DT: 06/18/2007  Job #: [id number]

## 2011-03-20 NOTE — Assessment & Plan Note (Signed)
[facility]                            CARDIOLOGY OFFICE NOTE   CLESTER, CHLEBOWSKI                       MRN:          [id number]  DATE:06/17/2008                            DOB:          1922/04/02    Mr. Mark Lane is in for followup visit.  To briefly summarize, he really is  doing quite well.  He has no specific complaints.  He denies shortness  of breath or significant progressive symptoms of any type.  The patient  last underwent cardiac catheterization in 2002, at that time, he had a  75% diagonal, 30% AV circumflex.   CURRENT MEDICATIONS:  Enteric-coated aspirin 81 mg daily.   On physical, he is alert and oriented in no distress.  Blood pressure is  134/74, pulse 53.  Lung fields are clear.  Cardiac rhythm is regular.  Extremities reveal no edema.   The electrocardiogram demonstrates sinus bradycardia with a rate of 53.  There is first-degree AV block with PR interval of 336 milliseconds.  This is combined with marked left axis deviation and delay in R wave  progression.   The patient is stable, and at the present time, given his age, there  does not appear to be a specific need for intervention.  He has not had  any syncope or presyncope.  I plan to discuss with EP, his overall  situation, we may have him come and see our electrophysiologist,  although at the present time, I would be doubtful that they would  recommend pacing therapy given the patient's age.     Arturo Morton. Riley Kill, MD, [facility]  Electronically Signed    TDS/MedQ  DD: 06/18/2008  DT: 06/18/2008  Job #: [id number]   cc:   Jeannett Senior A. Evlyn Kanner, M.D.

## 2011-03-20 NOTE — Assessment & Plan Note (Signed)
[facility]                            CARDIOLOGY OFFICE NOTE   KA, FLAMMER                       MRN:          [id number]  DATE:10/06/2008                            DOB:          1922-01-25    [name] is in for followup.  From a clinical standpoint, he has been  stable.  He denies any ongoing chest pain.  He has not had syncope or  presyncope.  He feels well.  He is staying active.   PHYSICAL EXAMINATION:  VITAL SIGNS:  Today, the blood pressure by me was  120/60 and pulse 64.  LUNGS:  Lung fields were clear.  CARDIAC:  Rhythm was regular.   The EKG reveals first-degree AV block.  There is an interventricular  conduction delay.  This suggest inferior and anterior infarct for which  he has had previous catheterization, and he only has 75% diagonal  stenosis.  Importantly, the PR interval is about 302 milliseconds.  At  the last office visit 3 months earlier, was 336 milliseconds.   I have discussed the case with Dr. Ladona Ridgel, and he recommended no  treatment in the absence of symptoms.  We will continue to follow him on  an every 37-month basis.     Arturo Morton. Riley Kill, MD, [facility]  Electronically Signed    TDS/MedQ  DD: 10/06/2008  DT: 10/07/2008  Job #: [id number]

## 2011-03-23 NOTE — Consult Note (Signed)
[place]  Patient:    Mark Lane, Mark Lane Visit Number: [id number] MRN: [id number]          Service Type: EMS Location: [facility] Attending Physician:  Sandi Raveling Dictated by:   Katy Fitch Naaman Plummer., M.D. Proc. Date: 04/14/02 Admit Date:  04/14/2002 Discharge Date: 04/14/2002   CC:         Dr. Colon Flattery, [facility], [address], [place], [place] [address]                          Consultation Report  REFERRING PHYSICIAN:  Dr. Colon Flattery, [place], [place].  CHIEF COMPLAINT:  Infected right index finger.  HISTORY OF PRESENT ILLNESS:  The patient is a 77 year old basically healthy man who sustained a laceration to the radial aspect of his right index finger proximal phalangeal segment one week ago.  He sought a medical consultation approximately 24 hours after his laceration. He was noted to have an up-to-date tetanus immunization with a booster in 1999.  His wound was cleaned and Steri-Strips applied approximately 24 hours after the injury and began to show signs of inflammation and drainage several days following the delayed closure.  He returned to see Dr. Dewaine Conger and was placed on Keflex 500 mg p.o. q.i.d. for several days and then was advised to decrease the medication to 500 mg p.o. b.i.d.  He returned for follow-up evaluation today and was noted by Dr. Dewaine Conger to have signs of cellulitis and purulent blister formation on the ulnar aspect of the index finger in the web between the index and long fingers.  Dr. Dewaine Conger became concerned that a serious cellulitis was developing and sought an urgent hand surgery consult.  We were contacted while in the operating room and advised that the patient should be transferred by his family to the [facility] emergency room where baseline laboratory studies including a CBC, UA, and CMET would be obtained.  We prepared the patient for possible admission and/or incision and  drainage of his hand.  PAST MEDICAL HISTORY:  His past medical history was reviewed in detail. He is basically healthy.  ALLERGIES:  He has no known drug allergies.  CURRENT MEDICATIONS:  Pravachol prescribed by Dr. Ardyth Harps, vitamin C, and aspirin, low dose.  His current medications included Keflex.  PAST SURGERY:  Included a procedure to remove a kidney stone and a hemorrhoid procedure in the 1970s.  He had a past coronary artery angiography by Dr. Riley Kill prior to his initiation of cholesterol modifying medication.  REVIEW OF SYSTEMS:  His review of systems was detailed and otherwise noncontributory.  PHYSICAL EXAMINATION:  GENERAL:  Revealed an alert, well-appearing 76 year old man.  VITAL SIGNS:  Temperature 97 oral, blood pressure 147/78 left arm sitting, heart rate 50 and regular, respirations 20, pulse oximetry 97% on room air.  HEENT:  Revealed glasses.  PERRLA.  Otherwise normal.  NECK:  Nontender.  CHEST:  Clear.  HEART:  S1, S2.  No murmur or gallop noted.  ABDOMEN:  Grossly normal.  NEUROLOGIC:  Grossly normal.  ORTHOPEDIC:  Confined primarily to the right hand revealed a healing laceration on the radial aspect of his right index finger proximal phalangeal segment that had signs of regional cellulitis, a fibrinous exudate, and signs of a low-grade lymphangitis proximal to the wound.  On the ulnar aspect of his proximal phalanx he had multiple purulent pustules and a small abrasion at the web space consistent  with a probable Band-Aid skin sheer injury that allowed organisms to penetrate deep to his epidermis.  He had no sign of ascending lymphangitis of the hand or forearm and did not show signs of lymphadenopathy.  He had full flexion of his finger, no sign of septic flexor tenosynovitis.  LABORATORY DATA:  X-rays from Dr. Garnette Gunner office were reviewed and noted to be normal.  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  PLAN:  We advised him to have IV antibiotic therapy in the form of vancomycin 1 g administered over 90 minutes in the emergency room followed by oral p.o. Levaquin, presuming that he may be harboring a resistant Staph organism.  His wound characteristics were not typical for Strep or gram negative organisms.  We have advised him to return home this evening and follow his vital signs, and return to our office for follow-up in 24 hours to recheck his wound.  We will initiate salt water soaks.  His wound will be dressed with Xeroflo, sterile gauze, and Coban.  He will be encouraged to keep his hand dry until assessment in the office. Dictated by:   Katy Fitch Naaman Plummer., M.D. Attending Physician:  Sandi Raveling DD:  04/14/02 TD:  04/16/02 Job: [id number]

## 2011-03-23 NOTE — Cardiovascular Report (Signed)
[place]. [facility]  Patient:    Mark Lane, Mark Lane                     MRN: [id number] Proc. Date: 04/30/01 Attending:  Arturo Morton. Riley Kill, M.D. [facility] CC:         Colon Flattery, D.O.  Cardiac Catheterization Laboratory   Cardiac Catheterization  INDICATIONS:  [name] is a delightful 77 year old who has recently had some symptoms characterized by increasing fatigability, decreasing exercise tolerance, mild chest tightness, and some shortness of breath.  The current study was done to assess coronary anatomy.  PROCEDURES PERFORMED: 1. Left heart catheterization. 2. Selective coronary arteriography. 3. Selective left ventriculography.  DESCRIPTION OF PROCEDURE:  The procedure was performed from the right femoral artery using 6-French catheters.  The RFA was entered through an anterior puncture without difficulty.  The patient tolerated the procedure well and there were no complications.  He was taken to the holding area in satisfactory clinical condition.  HEMODYNAMIC DATA: 1. The central aortic pressure was 136/67. 2. The left ventricular pressure was 132/17. 3. No aortic left ventricular gradient.  ANGIOGRAPHIC DATA: 1. On plain fluoroscopy there was evidence of coronary calcification,    particularly in the LAD diagonal distribution. 2. The left ventriculogram demonstrates normal systolic function.  Ejection    fraction is 71%.  No segmental abnormalities or contraction were    identified. 3. The left main coronary artery was large and free of critical disease. 4. The left anterior descending artery artery was moderately calcified.    There was a diagonal branch of modest size that bifurcated distally that    had about 70% to 75% narrowing at its proximal ostial area.  There was    mild luminal irregularity in the LAD at this point in time as well because    of the calcification.  No high-grade or critical areas of narrowing,    however, were  noted otherwise.  The distal LAD wrapped the apex and    provided the apical tip. 5. There was a small ramus intermedius. 6. There was a circumflex dominant system.  There was a large marginal branch    that was free of disease.  There was an eccentric focal plaque in the    mid circumflex that had about a 30% area of narrowing.  This was prior    to a posterolateral and posterior descending branch. 7. The right coronary artery was nondominant and was without significant    focal narrowing.  CONCLUSIONS: 1. Normal left ventricular function. 2. Calcified left anterior descending system with moderate stenosis of the    diagonal branch, as noted above. 3. Moderate coronary calcification. 4. No other high-grade areas of focal stenosis.  DISPOSITION:  We will recommend medical therapy and arterial blood gas is being obtained.  I will see the patient back in followup and we will encourage him to remain active. DD:  04/30/01 TD:  04/30/01 Job: [id number]

## 2011-03-23 NOTE — Assessment & Plan Note (Signed)
[facility]                              CARDIOLOGY OFFICE NOTE   Mark Lane, Mark Lane                       MRN:          [id number]  DATE:06/10/2006                            DOB:          1922-02-07    Mark Lane is in for a followup visit.  From a clinical standpoint, he is  stable.  He has not been having any ongoing chest pain.  He also has had no  syncope or presyncope.   His medications include Vytorin, which he takes intermittently, vitamin C  and enteric-coated aspirin 81 mg daily.   Last Holter monitor was done in August of last year.  He had moderate  bradycardia with some PACs noted.   On his examination today, the blood pressure is 124/70.  Pulse is 52.  Lung  fields are clear.  Cardiac rhythm is regular without a significant murmur.   The electrocardiogram demonstrates normal sinus rhythm with first-degree  atrioventricular block.  There may be some Wenckebach periodicity.  He has  fairly marked sinus arrhythmia.   Overall, Mark Lane continues to do extremely well.  He has not been having  any active symptoms.  His brother is 70 and had a pacemaker put in at 19.  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  He  is currently asymptomatic.  I will see him back in followup in about 6  months to a year.  Should he have any problems in the interim, he is to call  us.                              Arturo Morton. Riley Kill, MD, [facility]    TDS/MedQ  DD:  06/10/2006  DT:  06/11/2006  Job #:  [id number]

## 2011-05-18 ENCOUNTER — Encounter: Payer: Self-pay | Admitting: Cardiology

## 2011-05-18 ENCOUNTER — Ambulatory Visit (INDEPENDENT_AMBULATORY_CARE_PROVIDER_SITE_OTHER): Payer: Medicare Other | Admitting: Cardiology

## 2011-05-18 DIAGNOSIS — E785 Hyperlipidemia, unspecified: Secondary | ICD-10-CM

## 2011-05-18 DIAGNOSIS — E7800 Pure hypercholesterolemia, unspecified: Secondary | ICD-10-CM

## 2011-05-18 DIAGNOSIS — I2510 Atherosclerotic heart disease of native coronary artery without angina pectoris: Secondary | ICD-10-CM

## 2011-05-18 DIAGNOSIS — I441 Atrioventricular block, second degree: Secondary | ICD-10-CM

## 2011-05-18 LAB — HEPATIC FUNCTION PANEL
ALT: 18 U/L (ref 0–53)
AST: 29 U/L (ref 0–37)
Albumin: 4.4 g/dL (ref 3.5–5.2)
Alkaline Phosphatase: 53 U/L (ref 39–117)
Bilirubin, Direct: 0.1 mg/dL (ref 0.0–0.3)
Total Bilirubin: 0.9 mg/dL (ref 0.3–1.2)
Total Protein: 7.1 g/dL (ref 6.0–8.3)

## 2011-05-18 LAB — BASIC METABOLIC PANEL
BUN: 17 mg/dL (ref 6–23)
Calcium: 9.5 mg/dL (ref 8.4–10.5)
Creatinine, Ser: 1.2 mg/dL (ref 0.4–1.5)
GFR: 60.82 mL/min (ref >60.00)
Glucose, Bld: 86 mg/dL (ref 70–99)
Sodium: 141 mEq/L (ref 135–145)

## 2011-05-18 LAB — LIPID PANEL: HDL: 47.2 mg/dL (ref >39.00)

## 2011-05-18 NOTE — Progress Notes (Signed)
HPI:  He is fine.  His wife thinks he works to hard, also in the heat.  He denies it.  Overall he is doing well.  Not much SOB..    Current Outpatient Prescriptions  Medication Sig Dispense Refill  . aspirin 81 MG tablet Take 81 mg by mouth daily.        . clopidogrel (PLAVIX) 75 MG tablet Take 75 mg by mouth daily.        . furosemide (LASIX) 20 MG tablet Take 1/2 tablet once daily      . potassium chloride (K-DUR) 10 MEQ tablet TAKE 1 TABLET DAILY  30 tablet  3  . ZOCOR 20 MG tablet TAKE (1) TABLET DAILY AT BEDTIME.  30 each  3    No Known Allergies  Past Medical History  Diagnosis Date  . Hyperlipidemia   . Coronary artery disease     Past Surgical History  Procedure Date  . Kidney stone surgery     remove a kidney stone  . Hemorrhoid surgery     removed a hemorrhoid in the 1970s    No family history on file.  History   Social History  . Marital Status: Married    Spouse Name: N/A    Number of Children: N/A  . Years of Education: N/A   Occupational History  . Not on file.   Social History Main Topics  . Smoking status: Never Smoker   . Smokeless tobacco: Never Used  . Alcohol Use: No  . Drug Use: No  . Sexually Active: Not on file   Other Topics Concern  . Not on file   Social History Narrative   He is married, has 1-son. He does not smoke, or use alcohol    ROS: Please see the HPI.  All other systems reviewed and negative.  PHYSICAL EXAM:  BP 100/64  Pulse 50  Resp 18  Ht 5\' 6"  (1.676 m)  Wt 169 lb 1.9 oz (76.712 kg)  BMI 27.30 kg/m2  General: Well developed, well nourished, in no acute distress. Head:  Normocephalic and atraumatic. Neck: no JVD Lungs: Clear to auscultation and percussion. Heart: Normal S1 and S2.  No murmur, rubs or gallops.  Abdomen:  Normal bowel sounds; soft; non tender; no organomegaly Pulses: Pulses normal in all 4 extremities. Extremities: No clubbing or cyanosis. No edema. Neurologic: Alert and oriented x  3.  EKG:  SR with PACs.  SB.  Prolonged PR consistent with first degree AV block.    ASSESSMENT AND PLAN:

## 2011-05-18 NOTE — Patient Instructions (Signed)
Your physician recommends that you return for lab work today bmet, lipid, and liver  Your physician recommends that you schedule a follow-up appointment in: 4 months with Dr. Riley Kill

## 2011-05-19 NOTE — Assessment & Plan Note (Signed)
Would be worthwhile to check lipid and liver to ensure we are doing a good job on this.

## 2011-05-19 NOTE — Assessment & Plan Note (Signed)
Marked SB, with first degree.  PR is .37.  No symptoms.  Will continue to monitor as totally asymptomatic, continue to review with EP.

## 2011-05-19 NOTE — Assessment & Plan Note (Signed)
No current symptoms.  Doing well overall.  Continue minimal meds.  We discussed alterations, but for present time would continue.

## 2011-08-28 ENCOUNTER — Telehealth: Payer: Self-pay | Admitting: Cardiology

## 2011-08-28 NOTE — Telephone Encounter (Signed)
Wife called and stated pt is having indigestion/heartburn.  What can he take for this?  Been taking Tums but wanted to make sure this is ok for him to continue.  It is very frequent.

## 2011-08-28 NOTE — Telephone Encounter (Addendum)
Pt said has burning sensation mid abdomen 2-3 times a week/ tried TUMs with some relief and wants to know what he can take with his current meds. Denies, nausea, cp, left arm pain. Spoke with DOD Dr Graciela Husbands, kidney function normal and may use Maalox/ mylanta. Pt to call office if indigestion continues. Pt verbalized understanding. Alfonso Ramus RN

## 2011-09-12 ENCOUNTER — Encounter: Payer: Self-pay | Admitting: Cardiology

## 2011-09-12 ENCOUNTER — Ambulatory Visit (INDEPENDENT_AMBULATORY_CARE_PROVIDER_SITE_OTHER): Payer: Medicare Other | Admitting: Cardiology

## 2011-09-12 DIAGNOSIS — E785 Hyperlipidemia, unspecified: Secondary | ICD-10-CM

## 2011-09-12 DIAGNOSIS — I2510 Atherosclerotic heart disease of native coronary artery without angina pectoris: Secondary | ICD-10-CM

## 2011-09-12 DIAGNOSIS — I441 Atrioventricular block, second degree: Secondary | ICD-10-CM

## 2011-09-12 LAB — BASIC METABOLIC PANEL
Chloride: 102 mEq/L (ref 96–112)
Creatinine, Ser: 1.4 mg/dL (ref 0.4–1.5)
Potassium: 4 mEq/L (ref 3.5–5.1)
Sodium: 140 mEq/L (ref 135–145)

## 2011-09-12 NOTE — Progress Notes (Signed)
HPI:  [name] is doing well at the present time.  He is not having major problems.  He is tolerating his medications.  His wife is concerned that he is on his ladders, riding his tractor, and engaging in otherwise risky behaviors.  He denies syncope or presyncope. He denies any chest pain. He really feels quite good.  Current Outpatient Prescriptions  Medication Sig Dispense Refill  . aspirin 81 MG tablet Take 81 mg by mouth daily.        . cholecalciferol (VITAMIN D) 1000 UNITS tablet Take 1,000 Units by mouth daily. 2 tablets       . clopidogrel (PLAVIX) 75 MG tablet Take 75 mg by mouth daily.        . furosemide (LASIX) 20 MG tablet Take 1/2 tablet once daily      . potassium chloride (K-DUR) 10 MEQ tablet TAKE 1 TABLET DAILY  30 tablet  3  . ZOCOR 20 MG tablet TAKE (1) TABLET DAILY AT BEDTIME.  30 each  3    No Known Allergies  Past Medical History  Diagnosis Date  . Hyperlipidemia   . Coronary artery disease     Past Surgical History  Procedure Date  . Kidney stone surgery     remove a kidney stone  . Hemorrhoid surgery     removed a hemorrhoid in the 1970s    No family history on file.  History   Social History  . Marital Status: Married    Spouse Name: N/A    Number of Children: N/A  . Years of Education: N/A   Occupational History  . Not on file.   Social History Main Topics  . Smoking status: Former Games developer  . Smokeless tobacco: Never Used  . Alcohol Use: No  . Drug Use: No  . Sexually Active: Not on file   Other Topics Concern  . Not on file   Social History Narrative   He is married, has 1-son. He does not smoke, or use alcohol    ROS: Please see the HPI.  All other systems reviewed and negative.  PHYSICAL EXAM:  BP 112/62  Pulse 60  Ht 5\' 6"  (1.676 m)  Wt 166 lb 12.8 oz (75.66 kg)  BMI 26.92 kg/m2  General: Well developed, well nourished, in no acute distress. Head:  Normocephalic and atraumatic. Neck: no JVD Lungs: Clear to  auscultation and percussion. Heart: Normal S1 and S2.  No murmur, rubs or gallops.  Abdomen:  Normal bowel sounds; soft; non tender; no organomegaly Pulses: Pulses normal in all 4 extremities. Extremities: No clubbing or cyanosis. No edema. Neurologic: Alert and oriented x 3.  EKG:  NSR.  PR interval about 390 msec  (370 before).  Anterolateral and inferior MI, old.  Leftward axis.   ASSESSMENT AND PLAN:

## 2011-09-12 NOTE — Assessment & Plan Note (Signed)
This was reviewed with Dr. Johney Frame.  He felt that it should be watched, but in the absence of syncope or presyncope that device should be avoided.  The patient understands that he may ultimately require pacing.

## 2011-09-12 NOTE — Assessment & Plan Note (Signed)
Near target not long ago.

## 2011-09-12 NOTE — Patient Instructions (Addendum)
Your physician recommends that you schedule a follow-up appointment in: 4 months  Your physician recommends that you have bmp lab work today  Your physician has recommended you make the following change in your medication: STOP Plavix

## 2011-09-12 NOTE — Assessment & Plan Note (Addendum)
Really doing quite well.  Will check BMET because of need for diuretics.  Also, now out more than a year-----will stop plavix especially with BMS, and risk of bleeding.  He is aware.

## 2012-01-10 ENCOUNTER — Ambulatory Visit: Payer: Medicare Other | Admitting: Cardiology

## 2012-01-10 ENCOUNTER — Encounter: Payer: Self-pay | Admitting: Cardiology

## 2012-01-10 ENCOUNTER — Ambulatory Visit (INDEPENDENT_AMBULATORY_CARE_PROVIDER_SITE_OTHER): Payer: Medicare Other | Admitting: Cardiology

## 2012-01-10 DIAGNOSIS — E785 Hyperlipidemia, unspecified: Secondary | ICD-10-CM

## 2012-01-10 DIAGNOSIS — I2510 Atherosclerotic heart disease of native coronary artery without angina pectoris: Secondary | ICD-10-CM

## 2012-01-10 NOTE — Assessment & Plan Note (Signed)
No new symptoms.  Still on minimal meds.

## 2012-01-10 NOTE — Assessment & Plan Note (Signed)
Does not want to take statins.

## 2012-01-10 NOTE — Patient Instructions (Signed)
Your physician recommends that you have lab work today: BMP  Your physician recommends that you schedule a follow-up appointment in: 3 MONTHS   Your physician recommends that you continue on your current medications as directed. Please refer to the Current Medication list given to you today.   

## 2012-01-10 NOTE — Progress Notes (Signed)
   HPI:  He is doing very well.  He denies any major symptoms.  We discussed several things related to his cardiac condition.  Not really short of breath.  No syncope or presyncope.    Current Outpatient Prescriptions  Medication Sig Dispense Refill  . aspirin 81 MG tablet Take 81 mg by mouth daily.        . cholecalciferol (VITAMIN D) 1000 UNITS tablet Take 1,000 Units by mouth daily. 2 tablets       . furosemide (LASIX) 20 MG tablet Take 1/2 tablet once daily      . potassium chloride (K-DUR) 10 MEQ tablet TAKE 1 TABLET DAILY  30 tablet  3    No Known Allergies  Past Medical History  Diagnosis Date  . Hyperlipidemia   . Coronary artery disease     Past Surgical History  Procedure Date  . Kidney stone surgery     remove a kidney stone  . Hemorrhoid surgery     removed a hemorrhoid in the 1970s    No family history on file.  History   Social History  . Marital Status: Married    Spouse Name: N/A    Number of Children: N/A  . Years of Education: N/A   Occupational History  . Not on file.   Social History Main Topics  . Smoking status: Former Games developer  . Smokeless tobacco: Never Used  . Alcohol Use: No  . Drug Use: No  . Sexually Active: Not on file   Other Topics Concern  . Not on file   Social History Narrative   He is married, has 1-son. He does not smoke, or use alcohol    ROS: Please see the HPI.  All other systems reviewed and negative.  PHYSICAL EXAM:  BP 120/72  Pulse 54  Ht 5\' 6"  (1.676 m)  Wt 173 lb 6.4 oz (78.654 kg)  BMI 27.99 kg/m2  General: Well developed, well nourished, in no acute distress. Head:  Normocephalic and atraumatic. Neck: no JVD Lungs: Clear to auscultation and percussion. Heart: Normal S1 and S2.  No murmur, rubs or gallops.  Pulses: Pulses normal in all 4 extremities. Extremities: No clubbing or cyanosis. No edema. Neurologic: Alert and oriented x 3.  EKG:  SB.  First degree AV block, unchanged from prior tracing.   Left axis with borderline IVCD.  Delay in R wave progression secondary to 1.  Tracing not significantly changed from past tracing.    ASSESSMENT AND PLAN:

## 2012-01-11 LAB — BASIC METABOLIC PANEL
CO2: 31 mEq/L (ref 19–32)
Chloride: 103 mEq/L (ref 96–112)
GFR: 55.37 mL/min — ABNORMAL LOW (ref >60.00)
Glucose, Bld: 99 mg/dL (ref 70–99)
Potassium: 4.3 mEq/L (ref 3.5–5.1)
Sodium: 138 mEq/L (ref 135–145)

## 2012-01-29 ENCOUNTER — Other Ambulatory Visit: Payer: Self-pay | Admitting: *Deleted

## 2012-01-29 MED ORDER — NITROGLYCERIN 0.4 MG SL SUBL: 0.4000 mg | SUBLINGUAL_TABLET | SUBLINGUAL | Status: AC | PRN

## 2012-02-25 ENCOUNTER — Telehealth: Payer: Self-pay | Admitting: Cardiology

## 2012-02-25 NOTE — Telephone Encounter (Signed)
New Problem:      Patient's wife call with questions regarding her husbands medication.  Please call back.

## 2012-02-25 NOTE — Telephone Encounter (Signed)
I spoke with the pt's wife and the pt has a cough and sore throat. The pt did have a low grade temp last night. She wanted to know if the pt is okay to take Claritin D.  I made her aware that this would be okay with his other medications but can cause a increase in BP and pulse.  I recommended that the pt take Claritin and use Robitussin DM for cough. I made her aware that the pt should see his PCP if he continues to run a fever.

## 2012-03-12 ENCOUNTER — Encounter: Payer: Self-pay | Admitting: Cardiology

## 2012-04-14 ENCOUNTER — Ambulatory Visit (INDEPENDENT_AMBULATORY_CARE_PROVIDER_SITE_OTHER): Payer: Medicare Other | Admitting: Cardiology

## 2012-04-14 ENCOUNTER — Encounter: Payer: Self-pay | Admitting: Cardiology

## 2012-04-14 VITALS — BP 120/62 | HR 53 | Ht 67.0 in | Wt 171.8 lb

## 2012-04-14 DIAGNOSIS — E785 Hyperlipidemia, unspecified: Secondary | ICD-10-CM

## 2012-04-14 DIAGNOSIS — I2510 Atherosclerotic heart disease of native coronary artery without angina pectoris: Secondary | ICD-10-CM

## 2012-04-14 DIAGNOSIS — I441 Atrioventricular block, second degree: Secondary | ICD-10-CM

## 2012-04-14 NOTE — Progress Notes (Signed)
   HPI:  The patient is doing very well. He does notice that he sleeps a lot more. He sometimes has a little bit of problem passing his water. He denies any chest pain he denies any shortness of breath.  Current Outpatient Prescriptions  Medication Sig Dispense Refill  . aspirin 81 MG tablet Take 81 mg by mouth daily.        . cholecalciferol (VITAMIN D) 1000 UNITS tablet Take 1,000 Units by mouth daily. 2 tablets       . furosemide (LASIX) 20 MG tablet Take 1/2 tablet once daily      . nitroGLYCERIN (NITROSTAT) 0.4 MG SL tablet Place 1 tablet (0.4 mg total) under the tongue every 5 (five) minutes as needed for chest pain.  25 tablet  3  . potassium chloride (K-DUR) 10 MEQ tablet TAKE 1 TABLET DAILY  30 tablet  3    No Known Allergies  Past Medical History  Diagnosis Date  . Hyperlipidemia   . Coronary artery disease     Past Surgical History  Procedure Date  . Kidney stone surgery     remove a kidney stone  . Hemorrhoid surgery     removed a hemorrhoid in the 1970s    No family history on file.  History   Social History  . Marital Status: Married    Spouse Name: N/A    Number of Children: N/A  . Years of Education: N/A   Occupational History  . Not on file.   Social History Main Topics  . Smoking status: Former Games developer  . Smokeless tobacco: Never Used  . Alcohol Use: No  . Drug Use: No  . Sexually Active: Not on file   Other Topics Concern  . Not on file   Social History Narrative   He is married, has 1-son. He does not smoke, or use alcohol    ROS: Please see the HPI.  All other systems reviewed and negative.  PHYSICAL EXAM:  BP 120/62  Pulse 53  Ht 5\' 7"  (1.702 m)  Wt 171 lb 12.8 oz (77.928 kg)  BMI 26.91 kg/m2  General: Well developed, well nourished, in no acute distress. Head:  Normocephalic and atraumatic. Neck: no JVD Lungs: Clear to auscultation and percussion. Heart: Normal S1 and S2.  No murmur, rubs or gallops.  Pulses: Pulses normal  in all 4 extremities. Extremities: No clubbing or cyanosis. No edema. Neurologic: Alert and oriented x 3.  EKG:  SB.  First degree av block. Left axis deviation.  IVCD.   Probable old anterior MI.  Old inferior MI.  ASSESSMENT AND PLAN:

## 2012-04-14 NOTE — Assessment & Plan Note (Signed)
No new symptoms.

## 2012-04-14 NOTE — Patient Instructions (Signed)
Your physician recommends that you schedule a follow-up appointment in 3 MONTHS. 

## 2012-04-14 NOTE — Assessment & Plan Note (Signed)
Not much of a change.  Continue to monitor.

## 2012-04-14 NOTE — Assessment & Plan Note (Signed)
No longer on a statin  

## 2012-07-15 ENCOUNTER — Encounter: Payer: Self-pay | Admitting: Cardiology

## 2012-07-15 ENCOUNTER — Ambulatory Visit (INDEPENDENT_AMBULATORY_CARE_PROVIDER_SITE_OTHER): Payer: Medicare Other | Admitting: Cardiology

## 2012-07-15 VITALS — BP 117/66 | HR 50 | Ht 68.0 in | Wt 171.0 lb

## 2012-07-15 DIAGNOSIS — I441 Atrioventricular block, second degree: Secondary | ICD-10-CM

## 2012-07-15 DIAGNOSIS — I25810 Atherosclerosis of coronary artery bypass graft(s) without angina pectoris: Secondary | ICD-10-CM

## 2012-07-15 DIAGNOSIS — E785 Hyperlipidemia, unspecified: Secondary | ICD-10-CM

## 2012-07-15 DIAGNOSIS — Z79899 Other long term (current) drug therapy: Secondary | ICD-10-CM

## 2012-07-15 DIAGNOSIS — I2510 Atherosclerotic heart disease of native coronary artery without angina pectoris: Secondary | ICD-10-CM

## 2012-07-15 LAB — BASIC METABOLIC PANEL
CO2: 28 mEq/L (ref 19–32)
Chloride: 105 mEq/L (ref 96–112)
Creatinine, Ser: 1.1 mg/dL (ref 0.4–1.5)
Glucose, Bld: 101 mg/dL — ABNORMAL HIGH (ref 70–99)

## 2012-07-15 NOTE — Progress Notes (Signed)
   HPI:  Doing well.  No major complaints.  Feels good.  No new symptoms.    Current Outpatient Prescriptions  Medication Sig Dispense Refill  . aspirin 81 MG tablet Take 81 mg by mouth daily.        . cholecalciferol (VITAMIN D) 1000 UNITS tablet Take 1,000 Units by mouth daily. 2 tablets       . furosemide (LASIX) 20 MG tablet Take 1/2 tablet once daily      . nitroGLYCERIN (NITROSTAT) 0.4 MG SL tablet Place 1 tablet (0.4 mg total) under the tongue every 5 (five) minutes as needed for chest pain.  25 tablet  3  . potassium chloride (K-DUR) 10 MEQ tablet TAKE 1 TABLET DAILY  30 tablet  3    No Known Allergies  Past Medical History  Diagnosis Date  . Hyperlipidemia   . Coronary artery disease     Past Surgical History  Procedure Date  . Kidney stone surgery     remove a kidney stone  . Hemorrhoid surgery     removed a hemorrhoid in the 1970s    No family history on file.  History   Social History  . Marital Status: Married    Spouse Name: N/A    Number of Children: N/A  . Years of Education: N/A   Occupational History  . Not on file.   Social History Main Topics  . Smoking status: Former Games developer  . Smokeless tobacco: Never Used  . Alcohol Use: No  . Drug Use: No  . Sexually Active: Not on file   Other Topics Concern  . Not on file   Social History Narrative   He is married, has 1-son. He does not smoke, or use alcohol    ROS: Please see the HPI.  All other systems reviewed and negative.  PHYSICAL EXAM:  BP 117/66  Pulse 50  Ht 5\' 8"  (1.727 m)  Wt 171 lb (77.565 kg)  BMI 26.00 kg/m2  General: Well developed, well nourished, in no acute distress. Head:  Normocephalic and atraumatic. Neck: no JVD Lungs: Clear to auscultation and percussion. Heart: Normal S1 and S2.  No murmur, rubs or gallops.  Abdomen:  Normal bowel sounds; soft; non tender; no organomegaly Pulses: Pulses normal in all 4 extremities. Extremities: No clubbing or cyanosis. No  edema. Neurologic: Alert and oriented x 3.  EKG:  SB  First degree av block, unchanged from last tracing.  IVCD.  Left axis.  Delay in R wave progression, ASSESSMENT AND PLAN:

## 2012-07-15 NOTE — Patient Instructions (Signed)
Your physician recommends that you schedule a follow-up appointment in:   3 MONTHS WITH DR Riley Kill Your physician recommends that you continue on your current medications as directed. Please refer to the Current Medication list given to you today. Your physician recommends that you return for lab work in: TODAY BMET  DX  CAD AND   V58.69

## 2012-07-20 NOTE — Assessment & Plan Note (Signed)
The patient's chose not to take statins. He will be 86 on his next birthday.

## 2012-07-20 NOTE — Assessment & Plan Note (Signed)
He's not having a chest pain, and is nearly back to his prior status.

## 2012-07-20 NOTE — Assessment & Plan Note (Signed)
Continue him on his present time. No changes will be made. He remains off of beta blockers.

## 2012-10-21 ENCOUNTER — Encounter: Payer: Self-pay | Admitting: Cardiology

## 2012-10-21 ENCOUNTER — Ambulatory Visit (INDEPENDENT_AMBULATORY_CARE_PROVIDER_SITE_OTHER): Payer: Medicare Other | Admitting: Cardiology

## 2012-10-21 VITALS — BP 120/64 | HR 49 | Ht 67.0 in | Wt 171.0 lb

## 2012-10-21 DIAGNOSIS — I441 Atrioventricular block, second degree: Secondary | ICD-10-CM

## 2012-10-21 DIAGNOSIS — E785 Hyperlipidemia, unspecified: Secondary | ICD-10-CM

## 2012-10-21 DIAGNOSIS — I2510 Atherosclerotic heart disease of native coronary artery without angina pectoris: Secondary | ICD-10-CM

## 2012-10-21 NOTE — Assessment & Plan Note (Signed)
Tolerating low dose statin.  Would not want to increase.

## 2012-10-21 NOTE — Patient Instructions (Addendum)
Your physician recommends that you schedule a follow-up appointment in: FEBRUARY 2014  Your physician recommends that you continue on your current medications as directed. Please refer to the Current Medication list given to you today.

## 2012-10-21 NOTE — Assessment & Plan Note (Signed)
No recurrent angina 

## 2012-10-21 NOTE — Progress Notes (Signed)
   HPI:  Patient is in for followup. From a cardiac standpoint he has been stable. He's had no syncope or presyncope despite his electrocardiogram. He does sleep a lot more than he did 3-4 years ago and his wife notices this. The patient is not having any specific problems however.  Current Outpatient Prescriptions  Medication Sig Dispense Refill  . aspirin 81 MG tablet Take 81 mg by mouth daily.        . cholecalciferol (VITAMIN D) 1000 UNITS tablet Take 1,000 Units by mouth daily. 2 tablets       . furosemide (LASIX) 20 MG tablet Take 1/2 tablet once daily      . nitroGLYCERIN (NITROSTAT) 0.4 MG SL tablet Place 1 tablet (0.4 mg total) under the tongue every 5 (five) minutes as needed for chest pain.  25 tablet  3  . potassium chloride (K-DUR) 10 MEQ tablet TAKE 1 TABLET DAILY  30 tablet  3  . simvastatin (ZOCOR) 10 MG tablet Take 10 mg by mouth at bedtime.        No Known Allergies  Past Medical History  Diagnosis Date  . Hyperlipidemia   . Coronary artery disease     Past Surgical History  Procedure Date  . Kidney stone surgery     remove a kidney stone  . Hemorrhoid surgery     removed a hemorrhoid in the 1970s    No family history on file.  History   Social History  . Marital Status: Married    Spouse Name: N/A    Number of Children: N/A  . Years of Education: N/A   Occupational History  . Not on file.   Social History Main Topics  . Smoking status: Former Games developer  . Smokeless tobacco: Never Used  . Alcohol Use: No  . Drug Use: No  . Sexually Active: Not on file   Other Topics Concern  . Not on file   Social History Narrative   He is married, has 1-son. He does not smoke, or use alcohol    ROS: Please see the HPI.  All other systems reviewed and negative.  PHYSICAL EXAM:  BP 120/64  Pulse 49  Ht 5\' 7"  (1.702 m)  Wt 171 lb (77.565 kg)  BMI 26.78 kg/m2  SpO2 97%  General: Well developed, well nourished, in no acute distress. Head:  Normocephalic  and atraumatic. Neck: no JVD Lungs: Clear to auscultation and percussion. Heart: Normal S1 and S2.  No murmur, rubs or gallops.  Pulses: Pulses normal in all 4 extremities. Extremities: No clubbing or cyanosis. No edema. Neurologic: Alert and oriented x 3.  EKG:   Marked SB and first degree av block.  LBBB.    ASSESSMENT AND PLAN:

## 2012-10-21 NOTE — Assessment & Plan Note (Signed)
Patient has marked degree av block.  No symptoms.  We will continue close follow up at this point.  EP has recommended holding off on pacing.

## 2012-12-18 ENCOUNTER — Ambulatory Visit: Payer: Medicare Other | Admitting: Cardiology

## 2012-12-29 ENCOUNTER — Ambulatory Visit (INDEPENDENT_AMBULATORY_CARE_PROVIDER_SITE_OTHER): Payer: Medicare Other | Admitting: Cardiology

## 2012-12-29 ENCOUNTER — Encounter: Payer: Self-pay | Admitting: Cardiology

## 2012-12-29 VITALS — BP 120/60 | HR 61 | Ht 67.0 in | Wt 172.0 lb

## 2012-12-29 DIAGNOSIS — E785 Hyperlipidemia, unspecified: Secondary | ICD-10-CM

## 2012-12-29 DIAGNOSIS — I2589 Other forms of chronic ischemic heart disease: Secondary | ICD-10-CM

## 2012-12-29 DIAGNOSIS — I441 Atrioventricular block, second degree: Secondary | ICD-10-CM

## 2012-12-29 DIAGNOSIS — I2510 Atherosclerotic heart disease of native coronary artery without angina pectoris: Secondary | ICD-10-CM

## 2012-12-29 DIAGNOSIS — I255 Ischemic cardiomyopathy: Secondary | ICD-10-CM

## 2012-12-29 LAB — BASIC METABOLIC PANEL
BUN: 17 mg/dL (ref 6–23)
Creatinine, Ser: 1.2 mg/dL (ref 0.4–1.5)
GFR: 58.89 mL/min — ABNORMAL LOW (ref >60.00)
Glucose, Bld: 92 mg/dL (ref 70–99)
Potassium: 3.9 mEq/L (ref 3.5–5.1)

## 2012-12-29 NOTE — Patient Instructions (Addendum)
Your physician recommends that you have lab work today: [facility]  Your physician recommends that you schedule a follow-up appointment in: 4 MONTHS with Dr Shirlee Latch (previous pt of Dr Riley Kill)  Your physician recommends that you continue on your current medications as directed. Please refer to the Current Medication list given to you today.

## 2012-12-30 DIAGNOSIS — I255 Ischemic cardiomyopathy: Secondary | ICD-10-CM | POA: Insufficient documentation

## 2012-12-30 NOTE — Assessment & Plan Note (Signed)
His last EF was 35%.  He does not have excessive dyspnea at this time---so we will continue on same regimen.  Will check bmet.

## 2012-12-30 NOTE — Assessment & Plan Note (Signed)
No current symptoms of ischemia at present.  Therefore, treat medically.

## 2012-12-30 NOTE — Progress Notes (Signed)
   HPI:  This gentleman is getting along quite well.  He does admit to sleeping a lot more these days.  Denies ongoing chest pain or syncope.  No new symptoms.  We are watching his rhythm and the prolonged PR interval has been stable.    Current Outpatient Prescriptions  Medication Sig Dispense Refill  . aspirin 81 MG tablet Take 81 mg by mouth daily.        . cholecalciferol (VITAMIN D) 1000 UNITS tablet Take 1,000 Units by mouth daily. 2 tablets       . furosemide (LASIX) 20 MG tablet Take 1/2 tablet once daily      . nitroGLYCERIN (NITROSTAT) 0.4 MG SL tablet Place 1 tablet (0.4 mg total) under the tongue every 5 (five) minutes as needed for chest pain.  25 tablet  3  . potassium chloride (K-DUR) 10 MEQ tablet TAKE 1 TABLET DAILY  30 tablet  3  . simvastatin (ZOCOR) 10 MG tablet Take 10 mg by mouth at bedtime.       No current facility-administered medications for this visit.    No Known Allergies  Past Medical History  Diagnosis Date  . Hyperlipidemia   . Coronary artery disease     Past Surgical History  Procedure Laterality Date  . Kidney stone surgery      remove a kidney stone  . Hemorrhoid surgery      removed a hemorrhoid in the 1970s    No family history on file.  History   Social History  . Marital Status: Married    Spouse Name: N/A    Number of Children: N/A  . Years of Education: N/A   Occupational History  . Not on file.   Social History Main Topics  . Smoking status: Former Games developer  . Smokeless tobacco: Never Used  . Alcohol Use: No  . Drug Use: No  . Sexually Active: Not on file   Other Topics Concern  . Not on file   Social History Narrative   He is married, has 1-son. He does not smoke, or use alcohol    ROS: Please see the HPI.  All other systems reviewed and negative.  PHYSICAL EXAM:  BP 120/60  Pulse 61  Ht 5\' 7"  (1.702 m)  Wt 172 lb (78.019 kg)  BMI 26.93 kg/m2  SpO2 97%  General: Well developed, well nourished, in no acute  distress. Head:  Normocephalic and atraumatic. Neck: no JVD Lungs: Clear to auscultation and percussion. Heart: Normal S1 and S2.  S4 gallop.    Abdomen:  Normal bowel sounds; soft; non tender; no organomegaly Pulses: Pulses normal in all 4 extremities. Extremities: No clubbing or cyanosis. No edema. Neurologic: Alert and oriented x 3.  EKG:  NSR with first degree av block  (376 msec, similar).  Left axis deviation.  LBBB.    ASSESSMENT AND PLAN:

## 2012-12-30 NOTE — Assessment & Plan Note (Signed)
Tolerates low dose statin and willing to take.  Will need lipid and liver eventually.

## 2012-12-30 NOTE — Assessment & Plan Note (Signed)
This has largely not changed.  We will continue to monitor this with regular followup. He is aware that he may ultimately require permanent pacing.

## 2013-04-28 ENCOUNTER — Encounter: Payer: Self-pay | Admitting: Cardiology

## 2013-04-28 ENCOUNTER — Ambulatory Visit (INDEPENDENT_AMBULATORY_CARE_PROVIDER_SITE_OTHER): Payer: Medicare Other | Admitting: Cardiology

## 2013-04-28 VITALS — BP 132/60 | HR 65 | Ht 67.0 in | Wt 169.0 lb

## 2013-04-28 DIAGNOSIS — I2589 Other forms of chronic ischemic heart disease: Secondary | ICD-10-CM

## 2013-04-28 DIAGNOSIS — I441 Atrioventricular block, second degree: Secondary | ICD-10-CM

## 2013-04-28 DIAGNOSIS — E785 Hyperlipidemia, unspecified: Secondary | ICD-10-CM

## 2013-04-28 DIAGNOSIS — I255 Ischemic cardiomyopathy: Secondary | ICD-10-CM

## 2013-04-28 DIAGNOSIS — I509 Heart failure, unspecified: Secondary | ICD-10-CM

## 2013-04-28 DIAGNOSIS — I2510 Atherosclerotic heart disease of native coronary artery without angina pectoris: Secondary | ICD-10-CM

## 2013-04-28 LAB — BASIC METABOLIC PANEL
CO2: 28 mEq/L (ref 19–32)
Calcium: 9.3 mg/dL (ref 8.4–10.5)
Creatinine, Ser: 1.4 mg/dL (ref 0.4–1.5)
Glucose, Bld: 167 mg/dL — ABNORMAL HIGH (ref 70–99)

## 2013-04-28 MED ORDER — SIMVASTATIN 10 MG PO TABS: 10.0000 mg | ORAL_TABLET | Freq: Every day | ORAL | Status: DC

## 2013-04-28 MED ORDER — FUROSEMIDE 20 MG PO TABS: ORAL_TABLET | ORAL | Status: DC

## 2013-04-28 NOTE — Patient Instructions (Addendum)
Your physician recommends that you return for lab work today--BMET/BNP.   Your physician has requested that you have an echocardiogram. Echocardiography is a painless test that uses sound waves to create images of your heart. It provides your doctor with information about the size and shape of your heart and how well your heart's chambers and valves are working. This procedure takes approximately one hour. There are no restrictions for this procedure. July 2014  Your physician wants you to follow-up in: 3 months with Dr Shirlee Latch.  You will receive a reminder letter in the mail two months in advance. If you don't receive a letter, please call our office to schedule the follow-up appointment.

## 2013-04-28 NOTE — Progress Notes (Signed)
Patient ID: Mark Lane, male   DOB: [birth year], 79 y.o.   MRN: [id number] PCP: Dr. Evlyn Kanner  79 y.o. with history of CAD and ischemic cardiomyopathy presents for cardiology followup.  He has been seen by Dr. Riley Kill in the past and is seen by me for the first time today.  He had an anterolateral STEMI in 7/11 with ostial occlusion of a large 2nd diagonal that was treated with BMS.  EF immediately post-MI was 35%.  It has not been re-assessed since.  He has also been noted to have Mobitz Type I 2nd degree AV block.    Mr Tudisco has been doing well symptomatically.  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  No chest pain or NTG use.  No exertional dyspnea, lightheadedness, or syncope.  No orthopnea or PND.  He occasionally has mild ankle edema.    Labs (6/13): LDL 68, HDL 40 Labs (2/14): K 3.9, creatinine 1.2  PMH: 1. Hyperlipidemia 2. Nephrolithiasis 3. CAD: Anterolateral STEMI in 7/11.  LHC with ostial total occlusion of a large D2, dominant LCx, small RCA.  Patient had BMS to D2.  4. Mobitz type I 2nd degree AV block. 5. Ischemic cardiomyopathy: Echo (7/11) with EF 35% and peri-apical akinesis, grade II diastolic dysfunction, normal RV size and systolic function.   SH: Married, lives in [place], prior tobacco.  Has a farm, grows peaches.   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 aspirin 81 MG tablet Take 81 mg by mouth daily.        . cholecalciferol (VITAMIN D) 1000 UNITS tablet Take 1,000 Units by mouth daily. 2 tablets       . furosemide (LASIX) 20 MG tablet Take 1/2 tablet once daily  45 tablet  3  . potassium chloride (K-DUR) 10 MEQ tablet TAKE 1 TABLET DAILY  30 tablet  3  . simvastatin (ZOCOR) 10 MG tablet Take 1 tablet (10 mg total) by mouth at bedtime.  90 tablet  3   No current facility-administered medications for this visit.   BP 132/60  Pulse 65  Ht 5\' 7"  (1.702 m)   Wt 169 lb (76.658 kg)  BMI 26.46 kg/m2  SpO2 96% General: NAD Neck: No JVD, no thyromegaly or thyroid nodule.  Lungs: Clear to auscultation bilaterally with normal respiratory effort. CV: Nondisplaced PMI.  Heart regular S1/S2, no S3/S4, no murmur.  Trace ankle edema.  No carotid bruit.  Normal pedal pulses.  Abdomen: Soft, nontender, no hepatosplenomegaly, no distention.  Skin: Intact without lesions or rashes.  Neurologic: Alert and oriented x 3.  Psych: Normal affect. Extremities: No clubbing or cyanosis.   Assessment/Plan: 1. CAD: No ischemic symptoms.  Continue ASA 81 and statin.  2. Ischemic cardiomyopathy: EF 35% post-MI in 7/11.  He is currently euvolemic with NYHA class I-II symptoms.  He is not on a beta blocker because of Mobitz type I 2nd degree AVB.  He is not currently on an ACEI.   - I will get an echo to reassess EF.  If EF remains low, would consider addition of lisinopril.  - He is on a low dose of Lasix which he can continue.  Check BMET/BNP today.  3. Hyperlipidemia: I will try to get copy of recent lipids from PCP.  4. Mobitz type I 2nd degree AV block: No symptomatic bradycardia.  Will continue to follow.  No  beta blocker.   Marca Ancona 04/28/2013

## 2013-06-03 ENCOUNTER — Ambulatory Visit (HOSPITAL_COMMUNITY): Payer: Medicare Other | Attending: Cardiovascular Disease

## 2013-06-03 DIAGNOSIS — I509 Heart failure, unspecified: Secondary | ICD-10-CM | POA: Insufficient documentation

## 2013-06-03 DIAGNOSIS — I252 Old myocardial infarction: Secondary | ICD-10-CM | POA: Insufficient documentation

## 2013-06-03 DIAGNOSIS — I255 Ischemic cardiomyopathy: Secondary | ICD-10-CM

## 2013-06-03 DIAGNOSIS — Z87891 Personal history of nicotine dependence: Secondary | ICD-10-CM | POA: Insufficient documentation

## 2013-06-03 DIAGNOSIS — I2510 Atherosclerotic heart disease of native coronary artery without angina pectoris: Secondary | ICD-10-CM | POA: Insufficient documentation

## 2013-06-03 DIAGNOSIS — I441 Atrioventricular block, second degree: Secondary | ICD-10-CM | POA: Insufficient documentation

## 2013-06-03 DIAGNOSIS — E785 Hyperlipidemia, unspecified: Secondary | ICD-10-CM | POA: Insufficient documentation

## 2013-06-03 DIAGNOSIS — I2589 Other forms of chronic ischemic heart disease: Secondary | ICD-10-CM | POA: Insufficient documentation

## 2013-06-03 NOTE — Progress Notes (Signed)
Echocardiogram performed.  

## 2013-06-08 ENCOUNTER — Other Ambulatory Visit: Payer: Self-pay | Admitting: *Deleted

## 2013-06-08 DIAGNOSIS — I509 Heart failure, unspecified: Secondary | ICD-10-CM

## 2013-06-08 DIAGNOSIS — I2589 Other forms of chronic ischemic heart disease: Secondary | ICD-10-CM

## 2013-06-08 MED ORDER — POTASSIUM CHLORIDE ER 10 MEQ PO TBCR: EXTENDED_RELEASE_TABLET | ORAL | Status: DC

## 2013-06-08 MED ORDER — LISINOPRIL 5 MG PO TABS: 5.0000 mg | ORAL_TABLET | Freq: Every day | ORAL | Status: DC

## 2013-06-18 ENCOUNTER — Other Ambulatory Visit (INDEPENDENT_AMBULATORY_CARE_PROVIDER_SITE_OTHER): Payer: Medicare Other

## 2013-06-18 DIAGNOSIS — I2589 Other forms of chronic ischemic heart disease: Secondary | ICD-10-CM

## 2013-06-18 DIAGNOSIS — I509 Heart failure, unspecified: Secondary | ICD-10-CM

## 2013-06-19 LAB — BASIC METABOLIC PANEL
BUN: 20 mg/dL (ref 6–23)
Calcium: 9.3 mg/dL (ref 8.4–10.5)
GFR: 50.66 mL/min — ABNORMAL LOW (ref >60.00)
Glucose, Bld: 94 mg/dL (ref 70–99)
Sodium: 139 mEq/L (ref 135–145)

## 2013-09-30 ENCOUNTER — Encounter: Payer: Self-pay | Admitting: *Deleted

## 2013-10-05 ENCOUNTER — Ambulatory Visit (INDEPENDENT_AMBULATORY_CARE_PROVIDER_SITE_OTHER): Payer: Medicare Other | Admitting: Cardiology

## 2013-10-05 ENCOUNTER — Encounter: Payer: Self-pay | Admitting: Cardiology

## 2013-10-05 VITALS — BP 110/60 | HR 60 | Ht 67.0 in | Wt 168.0 lb

## 2013-10-05 DIAGNOSIS — I509 Heart failure, unspecified: Secondary | ICD-10-CM

## 2013-10-05 DIAGNOSIS — I2589 Other forms of chronic ischemic heart disease: Secondary | ICD-10-CM

## 2013-10-05 DIAGNOSIS — I2510 Atherosclerotic heart disease of native coronary artery without angina pectoris: Secondary | ICD-10-CM

## 2013-10-05 DIAGNOSIS — E785 Hyperlipidemia, unspecified: Secondary | ICD-10-CM

## 2013-10-05 DIAGNOSIS — I255 Ischemic cardiomyopathy: Secondary | ICD-10-CM

## 2013-10-05 DIAGNOSIS — I441 Atrioventricular block, second degree: Secondary | ICD-10-CM

## 2013-10-05 MED ORDER — LISINOPRIL 10 MG PO TABS: 10.0000 mg | ORAL_TABLET | Freq: Every day | ORAL | Status: DC

## 2013-10-05 NOTE — Patient Instructions (Addendum)
Increase lisinopril to 10mg  daily.   Your physician recommends that you return for lab work in: 2 weeks--BMET.   Your physician wants you to follow-up in: 6 months with Dr Shirlee Latch. (June 2015).  You will receive a reminder letter in the mail two months in advance. If you don't receive a letter, please call our office to schedule the follow-up appointment.

## 2013-10-06 NOTE — Progress Notes (Signed)
Patient ID: Staley Lunz, male   DOB: 09/26/1922, 79 y.o.   MRN: [id number] PCP: Dr. Evlyn Kanner  79 yo with history of CAD and ischemic cardiomyopathy presents for cardiology followup.  He had an anterolateral STEMI in 7/11 with ostial occlusion of a large 2nd diagonal that was treated with BMS.  EF immediately post-MI was 35%.  He has also been noted to have Mobitz Type I 2nd degree AV block at times.  Last echo in 7/14 showed EF 30-35% with septal/apical akinesis and anterior hypokinesis.    Mr Dombek has been doing well symptomatically.  He is active though not as much as in the past.  His wife has been ill and he has been taking care of her.  No chest pain or NTG use.  No exertional dyspnea, lightheadedness, or syncope.  No orthopnea or PND.    Labs (6/13): LDL 68, HDL 40 Labs (2/14): K 3.9, creatinine 1.2 Labs (8/14): K 4.1, creatinine 1.4  ECG: NSR at 51, left axis deviation, IVCD, 1st degree AV block with PR 334 msec.   PMH: 1. Hyperlipidemia 2. Nephrolithiasis 3. CAD: Anterolateral STEMI in 7/11.  LHC with ostial total occlusion of a large D2, dominant LCx, small RCA.  Patient had BMS to D2.  4. Mobitz type I 2nd degree AV block. 5. Ischemic cardiomyopathy: Echo (7/11) with EF 35% and peri-apical akinesis, grade II diastolic dysfunction, normal RV size and systolic function.  Echo (7/14) with EF 30-35%, septal/apical akinesis, anterior hypokinesis, mild MR/AI.   SH: Married, lives in [place], prior tobacco.  Has a farm, grows peaches.   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 aspirin 81 MG tablet Take 81 mg by mouth daily.        . cholecalciferol (VITAMIN D) 1000 UNITS tablet Take 1,000 Units by mouth daily. 2 tablets       . furosemide (LASIX) 20 MG tablet Take 1/2 tablet once daily  45 tablet  3  . potassium chloride (K-DUR) 10 MEQ tablet TAKE 1 TABLET DAILY  30 tablet  3  . simvastatin  (ZOCOR) 10 MG tablet Take 1 tablet (10 mg total) by mouth at bedtime.  90 tablet  3  . lisinopril (PRINIVIL,ZESTRIL) 10 MG tablet Take 1 tablet (10 mg total) by mouth daily.  30 tablet  4   No current facility-administered medications for this visit.   BP 110/60  Pulse 60  Ht 5\' 7"  (1.702 m)  Wt 168 lb (76.204 kg)  BMI 26.31 kg/m2 General: NAD Neck: No JVD, no thyromegaly or thyroid nodule.  Lungs: Clear to auscultation bilaterally with normal respiratory effort. CV: Nondisplaced PMI.  Heart regular S1/S2, no S3/S4, no murmur.  Trace ankle edema.  No carotid bruit.  Normal pedal pulses.  Abdomen: Soft, nontender, no hepatosplenomegaly, no distention.  Skin: Intact without lesions or rashes.  Neurologic: Alert and oriented x 3.  Psych: Normal affect. Extremities: No clubbing or cyanosis.   Assessment/Plan: 1. CAD: No ischemic symptoms.  Continue ASA 81 and statin.  2. Ischemic cardiomyopathy: EF 30-35% on recent echo, no significant change from post-MI echo in 2011.  He is currently euvolemic with NYHA class I-II symptoms.  He is not on a beta blocker because of Mobitz type I 2nd degree AVB (today has profound 1st degree AV block).   - Continue current Lasix dosing.  - Increase lisinopril to 10 mg daily.  -  No ICD given age.  - BMET in 2 wks.  3. Hyperlipidemia: I will try to get copy of recent lipids from PCP.  4. Mobitz type I 2nd degree AV block: Today has a profound 1st degree AV block.  No symptomatic bradycardia.  Will continue to follow.  No beta blocker.   Marca Ancona 10/06/2013

## 2013-10-20 ENCOUNTER — Other Ambulatory Visit (INDEPENDENT_AMBULATORY_CARE_PROVIDER_SITE_OTHER): Payer: Medicare Other

## 2013-10-20 DIAGNOSIS — I509 Heart failure, unspecified: Secondary | ICD-10-CM

## 2013-10-20 DIAGNOSIS — I2510 Atherosclerotic heart disease of native coronary artery without angina pectoris: Secondary | ICD-10-CM

## 2013-10-20 LAB — BASIC METABOLIC PANEL
Calcium: 9 mg/dL (ref 8.4–10.5)
Creatinine, Ser: 1.3 mg/dL (ref 0.4–1.5)

## 2013-11-19 ENCOUNTER — Other Ambulatory Visit: Payer: Self-pay | Admitting: Cardiology

## 2014-02-14 ENCOUNTER — Encounter (HOSPITAL_COMMUNITY): Payer: Self-pay | Admitting: Emergency Medicine

## 2014-02-14 ENCOUNTER — Emergency Department (HOSPITAL_COMMUNITY): Payer: Medicare Other

## 2014-02-14 ENCOUNTER — Emergency Department (HOSPITAL_COMMUNITY)
Admission: EM | Admit: 2014-02-14 | Discharge: 2014-02-14 | Disposition: A | Discharge status: Home / Self Care | Payer: Medicare Other | Attending: Emergency Medicine | Admitting: Emergency Medicine

## 2014-02-14 DIAGNOSIS — E785 Hyperlipidemia, unspecified: Secondary | ICD-10-CM | POA: Insufficient documentation

## 2014-02-14 DIAGNOSIS — Z7982 Long term (current) use of aspirin: Secondary | ICD-10-CM | POA: Insufficient documentation

## 2014-02-14 DIAGNOSIS — I2510 Atherosclerotic heart disease of native coronary artery without angina pectoris: Secondary | ICD-10-CM | POA: Insufficient documentation

## 2014-02-14 DIAGNOSIS — Z87891 Personal history of nicotine dependence: Secondary | ICD-10-CM | POA: Insufficient documentation

## 2014-02-14 DIAGNOSIS — Z79899 Other long term (current) drug therapy: Secondary | ICD-10-CM | POA: Insufficient documentation

## 2014-02-14 DIAGNOSIS — R002 Palpitations: Secondary | ICD-10-CM | POA: Insufficient documentation

## 2014-02-14 LAB — PRO B NATRIURETIC PEPTIDE: Pro B Natriuretic peptide (BNP): 499.6 pg/mL — ABNORMAL HIGH (ref 0–450)

## 2014-02-14 LAB — URINALYSIS, ROUTINE W REFLEX MICROSCOPIC
BILIRUBIN URINE: NEGATIVE
GLUCOSE, UA: NEGATIVE mg/dL
Hgb urine dipstick: NEGATIVE
Ketones, ur: NEGATIVE mg/dL
Leukocytes, UA: NEGATIVE
NITRITE: NEGATIVE
PH: 5.5 (ref 5.0–8.0)
Protein, ur: NEGATIVE mg/dL
SPECIFIC GRAVITY, URINE: 1.014 (ref 1.005–1.030)
Urobilinogen, UA: 0.2 mg/dL (ref 0.0–1.0)

## 2014-02-14 LAB — BASIC METABOLIC PANEL
BUN: 26 mg/dL — AB (ref 6–23)
CHLORIDE: 99 meq/L (ref 96–112)
CO2: 20 meq/L (ref 19–32)
Calcium: 9.7 mg/dL (ref 8.4–10.5)
Creatinine, Ser: 1.92 mg/dL — ABNORMAL HIGH (ref 0.50–1.35)
GFR calc Af Amer: 34 mL/min — ABNORMAL LOW (ref >90)
GFR, EST NON AFRICAN AMERICAN: 29 mL/min — AB (ref >90)
GLUCOSE: 113 mg/dL — AB (ref 70–99)
POTASSIUM: 4.9 meq/L (ref 3.7–5.3)
SODIUM: 136 meq/L — AB (ref 137–147)

## 2014-02-14 LAB — CBC
HEMATOCRIT: 40.1 % (ref 39.0–52.0)
HEMOGLOBIN: 13.9 g/dL (ref 13.0–17.0)
MCH: 31 pg (ref 26.0–34.0)
MCHC: 34.7 g/dL (ref 30.0–36.0)
MCV: 89.3 fL (ref 78.0–100.0)
Platelets: 218 10*3/uL (ref 150–400)
RBC: 4.49 MIL/uL (ref 4.22–5.81)
RDW: 13.1 % (ref 11.5–15.5)
WBC: 7.6 10*3/uL (ref 4.0–10.5)

## 2014-02-14 LAB — D-DIMER, QUANTITATIVE: D-Dimer, Quant: 0.5 ug/mL-FEU — ABNORMAL HIGH (ref 0.00–0.48)

## 2014-02-14 LAB — TROPONIN I: Troponin I: <0.3 ng/mL (ref <0.30)

## 2014-02-14 MED ORDER — ASPIRIN 325 MG PO TABS
325.0000 mg | ORAL_TABLET | ORAL | Status: AC
Start: 2014-02-14 — End: 2014-02-14
  Administered 2014-02-14: 325 mg via ORAL
  Filled 2014-02-14: qty 1

## 2014-02-14 MED ORDER — NITROGLYCERIN 0.4 MG SL SUBL: 0.4000 mg | SUBLINGUAL_TABLET | SUBLINGUAL | Status: DC | PRN

## 2014-02-14 NOTE — Discharge Instructions (Signed)
Palpitations   A palpitation is the feeling that your heartbeat is irregular or is faster than normal. It may feel like your heart is fluttering or skipping a beat. Palpitations are usually not a serious problem. However, in some cases, you may need further medical evaluation.  CAUSES   Palpitations can be caused by:   Smoking.   Caffeine or other stimulants, such as diet pills or energy drinks.   Alcohol.   Stress and anxiety.   Strenuous physical activity.   Fatigue.   Certain medicines.   Heart disease, especially if you have a history of arrhythmias. This includes atrial fibrillation, atrial flutter, or supraventricular tachycardia.   An improperly working pacemaker or defibrillator.  DIAGNOSIS   To find the cause of your palpitations, your caregiver will take your history and perform a physical exam. Tests may also be done, including:   Electrocardiography (ECG). This test records the heart's electrical activity.   Cardiac monitoring. This allows your caregiver to monitor your heart rate and rhythm in real time.   Holter monitor. This is a portable device that records your heartbeat and can help diagnose heart arrhythmias. It allows your caregiver to track your heart activity for several days, if needed.   Stress tests by exercise or by giving medicine that makes the heart beat faster.  TREATMENT   Treatment of palpitations depends on the cause of your symptoms and can vary greatly. Most cases of palpitations do not require any treatment other than time, relaxation, and monitoring your symptoms. Other causes, such as atrial fibrillation, atrial flutter, or supraventricular tachycardia, usually require further treatment.  HOME CARE INSTRUCTIONS    Avoid:   Caffeinated coffee, tea, soft drinks, diet pills, and energy drinks.   Chocolate.   Alcohol.   Stop smoking if you smoke.   Reduce your stress and anxiety. Things that can help you relax include:   A method that measures bodily functions so  you can learn to control them (biofeedback).   Yoga.   Meditation.   Physical activity such as swimming, jogging, or walking.   Get plenty of rest and sleep.  SEEK MEDICAL CARE IF:    You continue to have a fast or irregular heartbeat beyond 24 hours.   Your palpitations occur more often.  SEEK IMMEDIATE MEDICAL CARE IF:   You develop chest pain or shortness of breath.   You have a severe headache.   You feel dizzy, or you faint.  MAKE SURE YOU:   Understand these instructions.   Will watch your condition.   Will get help right away if you are not doing well or get worse.  Document Released: 10/19/2000 Document Revised: 02/16/2013 Document Reviewed: 12/21/2011  [employer] Patient Information 2014 [employer].

## 2014-02-14 NOTE — ED Notes (Signed)
Pt was felt like he had palpitations and diaphoresis on Friday and has been feeling "weak and washed out since"  He spoke with his MD who advised him to come in today.  No CP with this.

## 2014-02-14 NOTE — ED Notes (Signed)
Patient returned from XRay

## 2014-02-14 NOTE — ED Provider Notes (Signed)
CSN: [id number]     Arrival date & time 02/14/14  1511 History   First MD Initiated Contact with Patient 02/14/14 1530     Chief Complaint  Patient presents with  . Palpitations   HPI Pt had an episode of tachycardia him on Friday. Patient was feeling weak and diaphoretic. Family checked his pulse and it was up in the 100s. This is very unusual for him as he usually is bradycardic at baseline. The symptoms resolved but ever since he has been feeling somewhat weak and washed out. Patient states he feels more short of breath than usual. Even just walking across the room will make him sit down to rest. He is not having any chest pain. He has not noticed any fevers or coughing. He does not feel short of breath at rest.  He has had some trouble with pain in the anterior part of his right knee and is scheduled to have an ultrasound of his knee but is not having any trouble with leg swelling or calf pain. Patient does have history of coronary artery disease.   He has not experienced any pain similar to his prior myocardial infarction. Patient called his doctor today and told him to come to the emergency room for evaluation Past Medical History  Diagnosis Date  . Hyperlipidemia   . Coronary artery disease    Past Surgical History  Procedure Laterality Date  . Kidney stone surgery      remove a kidney stone  . Hemorrhoid surgery      removed a hemorrhoid in the 1970s  . Cardiac catheterization     Family History  Problem Relation Age of Onset  . Heart attack Father     in his 48's  . Cancer Brother     1/2  . Cancer Sister   . Heart Problems Brother 91    pacemaker 2/2   History  Substance Use Topics  . Smoking status: Former Research scientist (life sciences)  . Smokeless tobacco: Never Used  . Alcohol Use: No    Review of Systems  All other systems reviewed and are negative.     Allergies  Review of patient's allergies indicates no known allergies.  Home Medications   Current Outpatient Rx  Name   Route  Sig  Dispense  Refill  . aspirin 81 MG tablet   Oral   Take 81 mg by mouth daily.           . cholecalciferol (VITAMIN D) 1000 UNITS tablet   Oral   Take 1,000 Units by mouth daily. 2 tablets         . furosemide (LASIX) 20 MG tablet      Take 1/2 tablet once daily   45 tablet   3   . lisinopril (PRINIVIL,ZESTRIL) 10 MG tablet   Oral   Take 1 tablet (10 mg total) by mouth daily.   30 tablet   4   . potassium chloride (K-DUR,KLOR-CON) 10 MEQ tablet      TAKE 1 TABLET DAILY   30 tablet   5   . simvastatin (ZOCOR) 10 MG tablet   Oral   Take 1 tablet (10 mg total) by mouth at bedtime.   90 tablet   3   . sulfamethoxazole-trimethoprim (BACTRIM DS) 800-160 MG per tablet   Oral   Take 1 tablet by mouth 2 (two) times daily. For 10 days.          BP 98/53  Pulse 69  Temp(Src)  97.9 F (36.6 C) (Oral)  Resp 14  Ht 5\' 7"  (1.702 m)  Wt 160 lb (72.576 kg)  BMI 25.05 kg/m2  SpO2 99% Physical Exam  Nursing note and vitals reviewed. Constitutional: He appears well-developed and well-nourished. No distress.  HENT:  Head: Normocephalic and atraumatic.  Right Ear: External ear normal.  Left Ear: External ear normal.  Eyes: Conjunctivae are normal. Right eye exhibits no discharge. Left eye exhibits no discharge. No scleral icterus.  Neck: Neck supple. No tracheal deviation present.  Cardiovascular: Normal rate, regular rhythm and intact distal pulses.   Pulmonary/Chest: Effort normal and breath sounds normal. No stridor. No respiratory distress. He has no wheezes. He has no rales.  Abdominal: Soft. Bowel sounds are normal. He exhibits no distension. There is no tenderness. There is no rebound and no guarding.  Musculoskeletal: He exhibits no edema and no tenderness.  Questionable baker cyst posterior right knee, no ttp calf  Neurological: He is alert. He has normal strength. No cranial nerve deficit (no facial droop, extraocular movements intact, no slurred  speech) or sensory deficit. He exhibits normal muscle tone. He displays no seizure activity. Coordination normal.  Skin: Skin is warm and dry. No rash noted.  Psychiatric: He has a normal mood and affect.    ED Course  Procedures (including critical care time) Labs Review Labs Reviewed  BASIC METABOLIC PANEL - Abnormal; Notable for the following:    Sodium 136 (*)    Glucose, Bld 113 (*)    BUN 26 (*)    Creatinine, Ser 1.92 (*)    GFR calc non Af Amer 29 (*)    GFR calc Af Amer 34 (*)    All other components within normal limits  PRO B NATRIURETIC PEPTIDE - Abnormal; Notable for the following:    Pro B Natriuretic peptide (BNP) 499.6 (*)    All other components within normal limits  D-DIMER, QUANTITATIVE - Abnormal; Notable for the following:    D-Dimer, Quant 0.50 (*)    All other components within normal limits  TROPONIN I  CBC  URINALYSIS, ROUTINE W REFLEX MICROSCOPIC   Imaging Review Dg Chest 2 View  02/14/2014   CLINICAL DATA:  Shortness of breath with palpitations. History of coronary artery disease.  EXAM: CHEST  2 VIEW  COMPARISON:  DG CHEST 2 VIEW dated 05/11/2010  FINDINGS: The heart size and mediastinal contours are stable. Hiatal hernia is again noted. The lungs are clear. There is no pleural effusion or pneumothorax. No acute osseous findings are evident.  IMPRESSION: No significant residual pleural fluid. Otherwise stable chest. No acute cardiopulmonary process.   Electronically Signed   By: Camie Patience M.D.   On: 02/14/2014 17:30     EKG Interpretation   Date/Time:  Sunday February 14 2014 15:21:11 EDT Ventricular Rate:  66 PR Interval:  376 QRS Duration: 116 QT Interval:  386 QTC Calculation: 404 R Axis:   -58 Text Interpretation:  Sinus rhythm with sinus arrhythmia with 1st degree  A-V block Left axis deviation Inferior infarct , age undetermined  Anteroseptal infarct , age undetermined Abnormal ECG No significant change  since last tracing Confirmed by  Ceceilia Cephus  MD-J, Domanic Matusek ([id number]) on 02/14/2014  3:29:45 PM      MDM   Final diagnoses:  Palpitations    Pt asymptomatic in the ED.  No tachycardia.  BP is low but appears stable.   Labs are reassuring.  Age adjusted d dimer is normal.  Stable for discharge.  Follow up with PCP.  Consider outpatient cardiac monitoring if he has any recurrent palpitations.   Kathalene Frames, MD 02/14/14 [phone]

## 2014-02-14 NOTE — ED Notes (Signed)
Dr. Knapp at the bedside.  

## 2014-02-14 NOTE — ED Notes (Signed)
Patient taken to Xray  

## 2014-02-14 NOTE — ED Notes (Addendum)
Pt complains of dizziness, diaphoresis, and "warmth inside chest." Patient denies nausea, vomiting, diarrhea, or Level of consciousness changes.

## 2014-06-19 ENCOUNTER — Other Ambulatory Visit: Payer: Self-pay | Admitting: Cardiology

## 2014-07-14 ENCOUNTER — Encounter: Payer: Self-pay | Admitting: Cardiology

## 2014-07-14 ENCOUNTER — Ambulatory Visit: Payer: Medicare Other | Admitting: Cardiology

## 2014-07-14 ENCOUNTER — Ambulatory Visit (INDEPENDENT_AMBULATORY_CARE_PROVIDER_SITE_OTHER): Payer: Medicare Other | Admitting: Cardiology

## 2014-07-14 VITALS — BP 112/56 | HR 76 | Ht 67.0 in | Wt 164.0 lb

## 2014-07-14 DIAGNOSIS — I2510 Atherosclerotic heart disease of native coronary artery without angina pectoris: Secondary | ICD-10-CM

## 2014-07-14 DIAGNOSIS — I441 Atrioventricular block, second degree: Secondary | ICD-10-CM

## 2014-07-14 DIAGNOSIS — I2589 Other forms of chronic ischemic heart disease: Secondary | ICD-10-CM

## 2014-07-14 DIAGNOSIS — E785 Hyperlipidemia, unspecified: Secondary | ICD-10-CM

## 2014-07-14 DIAGNOSIS — I255 Ischemic cardiomyopathy: Secondary | ICD-10-CM

## 2014-07-14 LAB — BASIC METABOLIC PANEL
BUN: 21 mg/dL (ref 6–23)
CHLORIDE: 104 meq/L (ref 96–112)
CO2: 25 mEq/L (ref 19–32)
Calcium: 9.5 mg/dL (ref 8.4–10.5)
Creatinine, Ser: 1.4 mg/dL (ref 0.4–1.5)
GFR: 51.39 mL/min — AB (ref >60.00)
GLUCOSE: 86 mg/dL (ref 70–99)
POTASSIUM: 3.9 meq/L (ref 3.5–5.1)
SODIUM: 139 meq/L (ref 135–145)

## 2014-07-14 LAB — LIPID PANEL
Cholesterol: 202 mg/dL — ABNORMAL HIGH (ref 0–200)
HDL: 42.7 mg/dL (ref >39.00)
LDL Cholesterol: 133 mg/dL — ABNORMAL HIGH (ref 0–99)
NonHDL: 159.3
Total CHOL/HDL Ratio: 5
Triglycerides: 134 mg/dL (ref 0.0–149.0)
VLDL: 26.8 mg/dL (ref 0.0–40.0)

## 2014-07-14 NOTE — Patient Instructions (Signed)
Your physician recommends that you have  lab work today--Lipid profile/BMET.  Your physician wants you to follow-up in: 6 months with Dr Aundra Dubin. (March 2016). You will receive a reminder letter in the mail two months in advance. If you don't receive a letter, please call our office to schedule the follow-up appointment.

## 2014-07-15 NOTE — Progress Notes (Signed)
Patient ID: Mark Lane, male   DOB: 1922/08/23, 80 y.o.   MRN: [id number] PCP: Dr. Forde Dandy  80 yo with history of CAD and ischemic cardiomyopathy presents for cardiology followup.  He had an anterolateral STEMI in 7/11 with ostial occlusion of a large 2nd diagonal that was treated with BMS.  EF immediately post-MI was 35%.  He has also been noted to have Mobitz Type I 2nd degree AV block at times.  Last echo in 7/14 showed EF 30-35% with septal/apical akinesis and anterior hypokinesis.    Mark Lane has been doing well symptomatically.  He is active though not as much as in the past.  His wife has been ill and he has been taking care of her.  No chest pain or NTG use.  Exertional dyspnea only with heavy activity.  No lightheadedness or syncope.  No orthopnea or PND.  ECG continues to show a profound 1st degree AV block. Of note, labs in 4/15 from the ER showed creatinine 1.92.  At that time, he was thought to be dehydrated. Weight is down 4 lbs.    Labs (6/13): LDL 68, HDL 40 Labs (2/14): K 3.9, creatinine 1.2 Labs (8/14): K 4.1, creatinine 1.4 Labs (12/14): creatinine 1.3 Labs (4/15): K 4.9, creatinine 1.92, BNP 494.6  ECG: NSR at 65, 1st degree AV block with PR 400 msec, old ASMI, old inferior MI, lateral TWIs.   PMH: 1. Hyperlipidemia 2. Nephrolithiasis 3. CAD: Anterolateral STEMI in 7/11.  LHC with ostial total occlusion of a large D2, dominant LCx, small RCA.  Patient had BMS to D2.  4. Profound 1st degree AV block.  Mobitz type I 2nd degree AV block. 5. Ischemic cardiomyopathy: Echo (7/11) with EF 35% and peri-apical akinesis, grade II diastolic dysfunction, normal RV size and systolic function.  Echo (7/14) with EF 30-35%, septal/apical akinesis, anterior hypokinesis, mild Mark/AI.  6. CKD  SH: Married, lives in [place], prior tobacco.  Has a farm, grows peaches.   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 aspirin 81 MG tablet Take 81 mg by mouth daily.        . cholecalciferol (VITAMIN D) 1000 UNITS tablet Take 1,000 Units by mouth daily. 2 tablets      . furosemide (LASIX) 20 MG tablet TAKE (1/2) TABLET DAILY.  15 tablet  1  . lisinopril (PRINIVIL,ZESTRIL) 10 MG tablet Take 1 tablet (10 mg total) by mouth daily.  30 tablet  1  . potassium chloride (K-DUR,KLOR-CON) 10 MEQ tablet TAKE 1 TABLET DAILY  30 tablet  5  . simvastatin (ZOCOR) 10 MG tablet Take 1 tablet (10 mg total) by mouth at bedtime.  90 tablet  3  . [DISCONTINUED] potassium chloride (K-DUR) 10 MEQ tablet TAKE 1 TABLET DAILY  30 tablet  3   No current facility-administered medications for this visit.   BP 112/56  Pulse 76  Ht 5\' 7"  (1.702 m)  Wt 164 lb (74.39 kg)  BMI 25.68 kg/m2 General: NAD Neck: No JVD, no thyromegaly or thyroid nodule.  Lungs: Clear to auscultation bilaterally with normal respiratory effort. CV: Nondisplaced PMI.  Heart regular S1/S2, no S3/S4, no murmur.  Trace ankle edema.  No carotid bruit.  Normal pedal pulses.  Abdomen: Soft, nontender, no hepatosplenomegaly, no distention.  Skin: Intact without lesions or rashes.  Neurologic: Alert and oriented x 3.  Psych: Normal affect. Extremities: No clubbing or  cyanosis.   Assessment/Plan: 1. CAD: No ischemic symptoms.  Continue ASA 81 and statin.  2. Ischemic cardiomyopathy: EF 30-35% on recent echo, no significant change from post-MI echo in 2011.  He is currently euvolemic with NYHA class I-II symptoms.  He is not on a beta blocker because of history of Mobitz type I 2nd degree AVB and current profound 1st degree AV block.   - Continue current Lasix dosing.  - Continue lisinopril but will need a BMET given elevated creatinine back in 4/15.  - No ICD given 80.  3. Hyperlipidemia:Check lipids today.  4. Mobitz type I 2nd degree AV block: Today has a profound 1st degree AV block.  No symptomatic bradycardia.  Will continue to follow.  No  beta blocker.   [name] 07/15/2014

## 2014-07-19 ENCOUNTER — Other Ambulatory Visit: Payer: Self-pay | Admitting: *Deleted

## 2014-07-19 DIAGNOSIS — E785 Hyperlipidemia, unspecified: Secondary | ICD-10-CM

## 2014-07-19 DIAGNOSIS — I509 Heart failure, unspecified: Secondary | ICD-10-CM

## 2014-07-19 MED ORDER — SIMVASTATIN 20 MG PO TABS: 20.0000 mg | ORAL_TABLET | Freq: Every day | ORAL | Status: DC

## 2014-07-20 ENCOUNTER — Encounter: Payer: Self-pay | Admitting: Cardiology

## 2014-09-15 ENCOUNTER — Other Ambulatory Visit: Payer: Medicare Other

## 2014-10-02 ENCOUNTER — Other Ambulatory Visit: Payer: Self-pay | Admitting: Cardiology

## 2015-01-21 ENCOUNTER — Encounter: Payer: Self-pay | Admitting: Cardiology

## 2015-01-21 ENCOUNTER — Ambulatory Visit (INDEPENDENT_AMBULATORY_CARE_PROVIDER_SITE_OTHER): Payer: Medicare Other | Admitting: Cardiology

## 2015-01-21 VITALS — BP 110/60 | HR 59 | Ht 67.0 in | Wt 166.0 lb

## 2015-01-21 DIAGNOSIS — I2510 Atherosclerotic heart disease of native coronary artery without angina pectoris: Secondary | ICD-10-CM

## 2015-01-21 DIAGNOSIS — I440 Atrioventricular block, first degree: Secondary | ICD-10-CM

## 2015-01-21 DIAGNOSIS — E785 Hyperlipidemia, unspecified: Secondary | ICD-10-CM

## 2015-01-21 DIAGNOSIS — I255 Ischemic cardiomyopathy: Secondary | ICD-10-CM

## 2015-01-21 NOTE — Patient Instructions (Signed)
Your physician recommends that you have  lab work today--lipid profile/liver profile/BMET.  Your physician wants you to follow-up in: 6 months with Dr Aundra Dubin. (September 2016). You will receive a reminder letter in the mail two months in advance. If you don't receive a letter, please call our office to schedule the follow-up appointment.

## 2015-01-22 LAB — LIPID PANEL
Cholesterol: 144 mg/dL (ref 0–200)
HDL: 40 mg/dL (ref >=40)
LDL Cholesterol: 76 mg/dL (ref 0–99)
Total CHOL/HDL Ratio: 3.6 Ratio
Triglycerides: 142 mg/dL (ref <150)
VLDL: 28 mg/dL (ref 0–40)

## 2015-01-22 LAB — HEPATIC FUNCTION PANEL
ALBUMIN: 4.2 g/dL (ref 3.5–5.2)
ALT: 11 U/L (ref 0–53)
AST: 27 U/L (ref 0–37)
Alkaline Phosphatase: 63 U/L (ref 39–117)
BILIRUBIN DIRECT: 0.1 mg/dL (ref 0.0–0.3)
Indirect Bilirubin: 0.2 mg/dL (ref 0.2–1.2)
TOTAL PROTEIN: 7.5 g/dL (ref 6.0–8.3)
Total Bilirubin: 0.3 mg/dL (ref 0.2–1.2)

## 2015-01-22 LAB — BASIC METABOLIC PANEL
BUN: 19 mg/dL (ref 6–23)
CALCIUM: 9.9 mg/dL (ref 8.4–10.5)
CHLORIDE: 105 meq/L (ref 96–112)
CO2: 26 meq/L (ref 19–32)
Creat: 1.4 mg/dL — ABNORMAL HIGH (ref 0.50–1.35)
Glucose, Bld: 111 mg/dL — ABNORMAL HIGH (ref 70–99)
Potassium: 5.6 mEq/L — ABNORMAL HIGH (ref 3.5–5.3)
Sodium: 139 mEq/L (ref 135–145)

## 2015-01-23 NOTE — Progress Notes (Signed)
Patient ID: Mark Lane, male   DOB: 05/06/1922, 81 y.o.   MRN: [id number] PCP: Dr. Forde Dandy  81 yo with history of CAD and ischemic cardiomyopathy presents for cardiology followup.  He had an anterolateral STEMI in 7/11 with ostial occlusion of a large 2nd diagonal that was treated with BMS.  EF immediately post-MI was 35%.  He has also been noted to have Mobitz Type I 2nd degree AV block at times.  Last echo in 7/14 showed EF 30-35% with septal/apical akinesis and anterior hypokinesis.    Mark Lane has been doing well symptomatically.  He is active though not as much as in the past.  His wife has been ill and he has been taking care of her.  No chest pain or NTG use.  Exertional dyspnea only with heavy activity.  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  No lightheadedness or syncope.  No orthopnea or PND.  ECG continues to show a profound 1st degree AV block.  He is sleepy during the day at times, denies snoring.   Labs (6/13): LDL 68, HDL 40 Labs (2/14): K 3.9, creatinine 1.2 Labs (8/14): K 4.1, creatinine 1.4 Labs (12/14): creatinine 1.3 Labs (4/15): K 4.9, creatinine 1.92, BNP 494.6 Labs (9/15): K 3.9, creatinine 1.4, LDL 133, HDL 43  ECG: NSR, 1st degree AV block with PR 408 msec, old anterior MI, IVCD  PMH: 1. Hyperlipidemia 2. Nephrolithiasis 3. CAD: Anterolateral STEMI in 7/11.  LHC with ostial total occlusion of a large D2, dominant LCx, small RCA.  Patient had BMS to D2.  4. Profound 1st degree AV block.  Mobitz type I 2nd degree AV block. 5. Ischemic cardiomyopathy: Echo (7/11) with EF 35% and peri-apical akinesis, grade II diastolic dysfunction, normal RV size and systolic function.  Echo (7/14) with EF 30-35%, septal/apical akinesis, anterior hypokinesis, mild Mark/AI.  6. CKD  SH: Married, lives in [place], prior tobacco.  Has a farm, grows peaches.   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 aspirin 81 MG tablet Take 81 mg by mouth daily.      . cholecalciferol (VITAMIN D) 1000 UNITS tablet Take 1,000 Units by mouth daily. 2 tablets    . furosemide (LASIX) 20 MG tablet TAKE (1/2) TABLET DAILY. 15 tablet 4  . lisinopril (PRINIVIL,ZESTRIL) 10 MG tablet TAKE 1 TABLET DAILY 30 tablet 4  . potassium chloride (K-DUR) 10 MEQ tablet TAKE 1 TABLET DAILY 30 tablet 4  . simvastatin (ZOCOR) 20 MG tablet Take 1 tablet (20 mg total) by mouth at bedtime. 30 tablet 3   No current facility-administered medications for this visit.   BP 110/60 mmHg  Pulse 59  Ht 5\' 7"  (1.702 m)  Wt 166 lb (75.297 kg)  BMI 25.99 kg/m2 General: NAD Neck: No JVD, no thyromegaly or thyroid nodule.  Lungs: Clear to auscultation bilaterally with normal respiratory effort. CV: Nondisplaced PMI.  Heart regular S1/S2, no S3/S4, no murmur.  Trace ankle edema.  No carotid bruit.  Normal pedal pulses.  Abdomen: Soft, nontender, no hepatosplenomegaly, no distention.  Skin: Intact without lesions or rashes.  Neurologic: Alert and oriented x 3.  Psych: Normal affect. Extremities: No clubbing or cyanosis.   Assessment/Plan: 1. CAD: No ischemic symptoms.  Continue ASA 81 and statin.  2. Ischemic cardiomyopathy: EF 30-35% on recent echo, no significant change from post-MI echo in 2011.  He is  currently euvolemic with NYHA class I-II symptoms.  He is not on a beta blocker because of history of Mobitz type I 2nd degree AVB and current profound 1st degree AV block.   - Continue current Lasix dosing.  - Continue lisinopril, check BMET today.  - No ICD given age.  3. Hyperlipidemia:Check lipids today. Zocor was increased in the fall given elevated LDL.  4. Mobitz type I 2nd degree AV block: Today has a profound 1st degree AV block (same as last appointment).  No symptomatic bradycardia.  Will continue to follow.  No beta blocker.  5. Daytime sleepiness: I asked his wife to pay attention to his sleep to see if  he snores loudly or gasps/stops breathing.   [name] 01/23/2015

## 2015-01-24 ENCOUNTER — Telehealth: Payer: Self-pay | Admitting: Cardiology

## 2015-01-24 DIAGNOSIS — E875 Hyperkalemia: Secondary | ICD-10-CM

## 2015-01-24 NOTE — Telephone Encounter (Signed)
Spoke with patient about recent lab results 

## 2015-01-24 NOTE — Telephone Encounter (Deleted)
New Msg       Pt   bbg

## 2015-01-24 NOTE — Telephone Encounter (Signed)
New Msg ° ° ° ° ° ° °Pt returning call. ° ° °Please call back. °

## 2015-02-02 ENCOUNTER — Other Ambulatory Visit (INDEPENDENT_AMBULATORY_CARE_PROVIDER_SITE_OTHER): Payer: Medicare Other | Admitting: *Deleted

## 2015-02-02 DIAGNOSIS — E875 Hyperkalemia: Secondary | ICD-10-CM

## 2015-02-02 LAB — BASIC METABOLIC PANEL
BUN: 21 mg/dL (ref 6–23)
CO2: 34 mEq/L — ABNORMAL HIGH (ref 19–32)
CREATININE: 1.22 mg/dL (ref 0.40–1.50)
Calcium: 9.5 mg/dL (ref 8.4–10.5)
Chloride: 104 mEq/L (ref 96–112)
GFR: 59.17 mL/min — ABNORMAL LOW (ref >60.00)
Glucose, Bld: 84 mg/dL (ref 70–99)
Potassium: 3.9 mEq/L (ref 3.5–5.1)
SODIUM: 137 meq/L (ref 135–145)

## 2015-02-03 ENCOUNTER — Other Ambulatory Visit: Payer: Medicare Other

## 2015-04-26 ENCOUNTER — Other Ambulatory Visit: Payer: Self-pay | Admitting: Cardiology

## 2015-06-21 ENCOUNTER — Other Ambulatory Visit: Payer: Self-pay | Admitting: Cardiology

## 2015-07-07 ENCOUNTER — Telehealth: Payer: Self-pay | Admitting: Cardiology

## 2015-07-07 DIAGNOSIS — R000 Tachycardia, unspecified: Secondary | ICD-10-CM

## 2015-07-07 NOTE — Telephone Encounter (Signed)
Pt advised,verbalized understanding. 

## 2015-07-07 NOTE — Telephone Encounter (Signed)
New message    Pt son states pt: Heart rate usually runs 58-60 and today heart rate got up to 114  Heart rate running 74 now No chest pain,headache or SOB

## 2015-07-07 NOTE — Telephone Encounter (Signed)
Can have him wear 24 hour holter to make sure no atrial fibrillation.

## 2015-07-07 NOTE — Telephone Encounter (Signed)
Pt states earlier today he was walking up and down outside basement steps carrying boxes.  Pt states when he finished he went inside and sat down. Pt  states he felt his heart thumping when he sat down, checked his heart rate and it was 114. Pt did not check his BP.  Pt denies any other symptoms including chest pain, SOB, lightheadedness. Pt states his heart rate gradually slowed down and is now 74. Pt denies any other episodes of feeling his heart thumping or beating fast.  Pt advised to stay well hydrated.  Pt advised I will forward to Dr Aundra Dubin for review

## 2015-07-08 ENCOUNTER — Ambulatory Visit (INDEPENDENT_AMBULATORY_CARE_PROVIDER_SITE_OTHER): Payer: Medicare Other

## 2015-07-08 DIAGNOSIS — R000 Tachycardia, unspecified: Secondary | ICD-10-CM

## 2015-07-19 ENCOUNTER — Telehealth: Payer: Self-pay | Admitting: Cardiology

## 2015-07-19 NOTE — Telephone Encounter (Signed)
Spoke with patient about recent monitor results.

## 2015-07-19 NOTE — Telephone Encounter (Signed)
New message ° ° ° ° °Returning Anne's call °

## 2015-08-19 ENCOUNTER — Encounter: Payer: Self-pay | Admitting: Cardiology

## 2015-08-19 ENCOUNTER — Ambulatory Visit (INDEPENDENT_AMBULATORY_CARE_PROVIDER_SITE_OTHER): Payer: Medicare Other | Admitting: Cardiology

## 2015-08-19 VITALS — BP 110/66 | HR 68 | Ht 67.0 in | Wt 163.4 lb

## 2015-08-19 DIAGNOSIS — E785 Hyperlipidemia, unspecified: Secondary | ICD-10-CM

## 2015-08-19 DIAGNOSIS — I2510 Atherosclerotic heart disease of native coronary artery without angina pectoris: Secondary | ICD-10-CM

## 2015-08-19 DIAGNOSIS — I255 Ischemic cardiomyopathy: Secondary | ICD-10-CM | POA: Diagnosis not present

## 2015-08-19 LAB — BASIC METABOLIC PANEL
BUN: 16 mg/dL (ref 7–25)
CALCIUM: 9.3 mg/dL (ref 8.6–10.3)
CO2: 28 mmol/L (ref 20–31)
Chloride: 104 mmol/L (ref 98–110)
Creat: 1.15 mg/dL — ABNORMAL HIGH (ref 0.70–1.11)
Glucose, Bld: 75 mg/dL (ref 65–99)
Potassium: 3.8 mmol/L (ref 3.5–5.3)
Sodium: 140 mmol/L (ref 135–146)

## 2015-08-19 NOTE — Patient Instructions (Signed)
Medication Instructions:  Your physician recommends that you continue on your current medications as directed. Please refer to the Current Medication list given to you today.   Labwork: TODAY - basic metabolic panel   Testing/Procedures: None Ordered   Follow-Up: Your physician wants you to follow-up in: 6 months with Dr. Acie Fredrickson.  You will receive a reminder letter in the mail two months in advance. If you don't receive a letter, please call our office to schedule the follow-up appointment.

## 2015-08-21 NOTE — Progress Notes (Signed)
Patient ID: Mark Lane, male   DOB: 09-30-1922, 81 y.o.   MRN: [id number] PCP: Dr. Forde Dandy  81 yo with history of CAD and ischemic cardiomyopathy presents for cardiology followup.  He had an anterolateral STEMI in 7/11 with ostial occlusion of a large 2nd diagonal that was treated with BMS.  EF immediately post-MI was 35%.  He has also been noted to have Mobitz Type I 2nd degree AV block at times.  Last echo in 7/14 showed EF 30-35% with septal/apical akinesis and anterior hypokinesis.    Mr Lance has been doing well symptomatically.  He is active though not as much as in the past.  His wife has been ill and he has been taking care of her.  No chest pain or NTG use.  Exertional dyspnea only with heavy activity.  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  No lightheadedness or syncope.  No orthopnea or PND.  ECG continues to show a profound 1st degree AV block with IVCD.  He is sleepy during the day at times, denies snoring. He fatigues more easily than in the past.  Labs (6/13): LDL 68, HDL 40 Labs (2/14): K 3.9, creatinine 1.2 Labs (8/14): K 4.1, creatinine 1.4 Labs (12/14): creatinine 1.3 Labs (4/15): K 4.9, creatinine 1.92, BNP 494.6 Labs (9/15): K 3.9, creatinine 1.4, LDL 133, HDL 43 Labs (3/16): K 3.9, creatinine 1.22, LDL 76  ECG: NSR, 1st degree AV block (profound), IVCD  PMH: 1. Hyperlipidemia 2. Nephrolithiasis 3. CAD: Anterolateral STEMI in 7/11.  LHC with ostial total occlusion of a large D2, dominant LCx, small RCA.  Patient had BMS to D2.  4. Profound 1st degree AV block.  Mobitz type I 2nd degree AV block. 5. Ischemic cardiomyopathy: Echo (7/11) with EF 35% and peri-apical akinesis, grade II diastolic dysfunction, normal RV size and systolic function.  Echo (7/14) with EF 30-35%, septal/apical akinesis, anterior hypokinesis, mild MR/AI.  6. CKD  SH: Married, lives in [place], prior tobacco.  Has a farm, grows peaches.   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 aspirin 81 MG tablet Take 81 mg by mouth daily.      . cholecalciferol (VITAMIN D) 1000 UNITS tablet Take 1,000 Units by mouth daily. 2 tablets    . furosemide (LASIX) 20 MG tablet TAKE (1/2) TABLET DAILY. 15 tablet 2  . lisinopril (PRINIVIL,ZESTRIL) 10 MG tablet TAKE 1 TABLET DAILY 30 tablet 2  . simvastatin (ZOCOR) 20 MG tablet TAKE ONE TABLET AT BEDTIME 30 tablet 9   No current facility-administered medications for this visit.   BP 110/66 mmHg  Pulse 68  Ht 5\' 7"  (1.702 m)  Wt 163 lb 6.4 oz (74.118 kg)  BMI 25.59 kg/m2  SpO2 97% General: NAD Neck: No JVD, no thyromegaly or thyroid nodule.  Lungs: Clear to auscultation bilaterally with normal respiratory effort. CV: Nondisplaced PMI.  Heart regular S1/S2, no S3/S4, no murmur.  Trace ankle edema.  No carotid bruit.  Normal pedal pulses.  Abdomen: Soft, nontender, no hepatosplenomegaly, no distention.  Skin: Intact without lesions or rashes.  Neurologic: Alert and oriented x 3.  Psych: Normal affect. Extremities: No clubbing or cyanosis.   Assessment/Plan: 1. CAD: No ischemic symptoms.  Continue ASA 81 and statin.  2. Ischemic cardiomyopathy: EF 30-35% on recent echo, no significant change from post-MI echo in 2011.  He is currently euvolemic with NYHA  class I-II symptoms.  He is not on a beta blocker because of history of Mobitz type I 2nd degree AVB and current profound 1st degree AV block.   - Continue current Lasix dosing.  - Continue lisinopril, check BMET today.  - No ICD given 81 age.  3. Hyperlipidemia:  On Zocor, good lipids in 3/16.   4. Mobitz type I 2nd degree AV block: Today has a profound 1st degree AV block (same as last appointment).  No symptomatic bradycardia.  Will continue to follow.  No beta blocker.  5. Daytime sleepiness: I asked his wife to pay attention to his sleep to see if he snores loudly or gasps/stops breathing.    Followup in 6 months.   [name] 10/79/2016

## 2015-10-05 ENCOUNTER — Ambulatory Visit: Payer: Medicare Other | Admitting: Cardiology

## 2015-11-25 ENCOUNTER — Other Ambulatory Visit: Payer: Self-pay | Admitting: Cardiology

## 2016-01-11 DIAGNOSIS — I129 Hypertensive chronic kidney disease with stage 1 through stage 4 chronic kidney disease, or unspecified chronic kidney disease: Secondary | ICD-10-CM | POA: Diagnosis not present

## 2016-01-11 DIAGNOSIS — I255 Ischemic cardiomyopathy: Secondary | ICD-10-CM | POA: Diagnosis not present

## 2016-01-11 DIAGNOSIS — N200 Calculus of kidney: Secondary | ICD-10-CM | POA: Diagnosis not present

## 2016-01-11 DIAGNOSIS — N183 Chronic kidney disease, stage 3 (moderate): Secondary | ICD-10-CM | POA: Diagnosis not present

## 2016-01-11 DIAGNOSIS — Z1389 Encounter for screening for other disorder: Secondary | ICD-10-CM | POA: Diagnosis not present

## 2016-01-11 DIAGNOSIS — K5900 Constipation, unspecified: Secondary | ICD-10-CM | POA: Diagnosis not present

## 2016-01-11 DIAGNOSIS — E784 Other hyperlipidemia: Secondary | ICD-10-CM | POA: Diagnosis not present

## 2016-01-11 DIAGNOSIS — I2510 Atherosclerotic heart disease of native coronary artery without angina pectoris: Secondary | ICD-10-CM | POA: Diagnosis not present

## 2016-04-04 DIAGNOSIS — Z961 Presence of intraocular lens: Secondary | ICD-10-CM | POA: Diagnosis not present

## 2016-04-04 DIAGNOSIS — H52203 Unspecified astigmatism, bilateral: Secondary | ICD-10-CM | POA: Diagnosis not present

## 2016-04-04 DIAGNOSIS — H353131 Nonexudative age-related macular degeneration, bilateral, early dry stage: Secondary | ICD-10-CM | POA: Diagnosis not present

## 2016-04-04 DIAGNOSIS — H43813 Vitreous degeneration, bilateral: Secondary | ICD-10-CM | POA: Diagnosis not present

## 2016-05-19 ENCOUNTER — Other Ambulatory Visit: Payer: Self-pay | Admitting: Cardiology

## 2016-07-18 ENCOUNTER — Encounter: Payer: Self-pay | Admitting: Cardiology

## 2016-07-23 DIAGNOSIS — I129 Hypertensive chronic kidney disease with stage 1 through stage 4 chronic kidney disease, or unspecified chronic kidney disease: Secondary | ICD-10-CM | POA: Diagnosis not present

## 2016-07-23 DIAGNOSIS — N183 Chronic kidney disease, stage 3 (moderate): Secondary | ICD-10-CM | POA: Diagnosis not present

## 2016-07-23 DIAGNOSIS — E784 Other hyperlipidemia: Secondary | ICD-10-CM | POA: Diagnosis not present

## 2016-07-23 DIAGNOSIS — Z6824 Body mass index (BMI) 24.0-24.9, adult: Secondary | ICD-10-CM | POA: Diagnosis not present

## 2016-07-23 DIAGNOSIS — I2510 Atherosclerotic heart disease of native coronary artery without angina pectoris: Secondary | ICD-10-CM | POA: Diagnosis not present

## 2016-07-23 DIAGNOSIS — R7301 Impaired fasting glucose: Secondary | ICD-10-CM | POA: Diagnosis not present

## 2016-07-23 DIAGNOSIS — I255 Ischemic cardiomyopathy: Secondary | ICD-10-CM | POA: Diagnosis not present

## 2016-07-23 DIAGNOSIS — Z23 Encounter for immunization: Secondary | ICD-10-CM | POA: Diagnosis not present

## 2016-07-23 DIAGNOSIS — N200 Calculus of kidney: Secondary | ICD-10-CM | POA: Diagnosis not present

## 2016-08-01 ENCOUNTER — Ambulatory Visit (INDEPENDENT_AMBULATORY_CARE_PROVIDER_SITE_OTHER): Payer: PPO | Admitting: Cardiology

## 2016-08-01 ENCOUNTER — Encounter: Payer: Self-pay | Admitting: Cardiology

## 2016-08-01 VITALS — BP 122/58 | HR 49 | Ht 67.0 in | Wt 159.0 lb

## 2016-08-01 DIAGNOSIS — I440 Atrioventricular block, first degree: Secondary | ICD-10-CM

## 2016-08-01 DIAGNOSIS — I2510 Atherosclerotic heart disease of native coronary artery without angina pectoris: Secondary | ICD-10-CM | POA: Diagnosis not present

## 2016-08-01 DIAGNOSIS — I255 Ischemic cardiomyopathy: Secondary | ICD-10-CM

## 2016-08-01 NOTE — Patient Instructions (Signed)
Medication Instructions:  Your physician recommends that you continue on your current medications as directed. Please refer to the Current Medication list given to you today.   Labwork: None   Testing/Procedures: None   Follow-Up: Your physician wants you to follow-up in: 6 months with Dr Aundra Dubin in the [facility]. (March 2018) [phone] You will receive a reminder letter in the mail two months in advance. If you don't receive a letter, please call our office to schedule the follow-up appointment.        If you need a refill on your cardiac medications before your next appointment, please call your pharmacy.

## 2016-08-02 NOTE — Progress Notes (Signed)
Patient ID: Mark Lane, male   DOB: [birth year], 82 y.o.   MRN: [id number] PCP: Dr. Forde Dandy  82 yo with history of CAD and ischemic cardiomyopathy presents for cardiology followup.  He had an anterolateral STEMI in 7/11 with ostial occlusion of a large 2nd diagonal that was treated with BMS.  EF immediately post-MI was 35%.  He has also been noted to have Mobitz Type I 2nd degree AV block at times.  Last echo in 7/14 showed EF 30-35% with septal/apical akinesis and anterior hypokinesis.    Mark Lane has been doing well symptomatically.  He is active though not as much as in the past.  His wife has been ill and he has been taking care of her.  No chest pain or NTG use.  Exertional dyspnea only with heavy activity/fast walking.  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  No lightheadedness or syncope.  No orthopnea or PND.  ECG continues to show a profound 1st degree AV block with LBBB.  He is sleepy during the day at times, denies snoring. He fatigues more easily than in the past.  Weight is down 4 lbs.   Labs (6/13): LDL 68, HDL 40 Labs (2/14): K 3.9, creatinine 1.2 Labs (8/14): K 4.1, creatinine 1.4 Labs (12/14): creatinine 1.3 Labs (4/15): K 4.9, creatinine 1.92, BNP 494.6 Labs (9/15): K 3.9, creatinine 1.4, LDL 133, HDL 43 Labs (3/16): K 3.9, creatinine 1.22, LDL 76 Labs (10/16): K 3.8, creatinine 1.15  ECG: NSR at 49, 1st degree AV block (profound), LBBB 152 msec.  PMH: 1. Hyperlipidemia 2. Nephrolithiasis 3. CAD: Anterolateral STEMI in 7/11.  LHC with ostial total occlusion of a large D2, dominant LCx, small RCA.  Patient had BMS to D2.  4. Profound 1st degree AV block.  Mobitz type I 2nd degree AV block. 5. Ischemic cardiomyopathy: Echo (7/11) with EF 35% and peri-apical akinesis, grade II diastolic dysfunction, normal RV size and systolic function.  Echo (7/14) with EF 30-35%, septal/apical akinesis, anterior hypokinesis, mild Mark/AI.  6. CKD  SH: Married, lives in [place], prior  tobacco.  Has a farm, grows peaches.   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 aspirin 81 MG tablet Take 81 mg by mouth daily.      . cholecalciferol (VITAMIN D) 1000 UNITS tablet Take 1,000 Units by mouth daily. 2 tablets    . furosemide (LASIX) 20 MG tablet TAKE (1/2) TABLET DAILY. 15 tablet 4  . lisinopril (PRINIVIL,ZESTRIL) 10 MG tablet TAKE 1 TABLET DAILY 30 tablet 4  . simvastatin (ZOCOR) 20 MG tablet TAKE ONE TABLET AT BEDTIME 90 tablet 0   No current facility-administered medications for this visit.    BP (!) 122/58   Pulse (!) 49   Ht 5\' 7"  (1.702 m)   Wt 159 lb (72.1 kg)   BMI 24.90 kg/m  General: NAD Neck: No JVD, no thyromegaly or thyroid nodule.  Lungs: Clear to auscultation bilaterally with normal respiratory effort. CV: Nondisplaced PMI.  Heart regular S1/S2, no S3/S4, no murmur.  1+ ankle edema.  No carotid bruit.  Normal pedal pulses.  Abdomen: Soft, nontender, no hepatosplenomegaly, no distention.  Skin: Intact without lesions or rashes.  Neurologic: Alert and oriented x 3.  Psych: Normal affect. Extremities: No clubbing or cyanosis.   Assessment/Plan: 1. CAD: No ischemic symptoms.  Continue ASA 81 and statin.  2. Chronic systolic  CHF: Ischemic cardiomyopathy. EF 30-35% on last echo, no significant change from post-MI echo in 2011.  He is currently euvolemic with NYHA class I-II symptoms.  He is not on a beta blocker because of history of Mobitz type I 2nd degree AVB and current profound 1st degree AV block and bradycardia.   - Continue current Lasix dosing.  - Continue lisinopril, will get recent BMET from Dr Baldwin Crown office.  - No ICD given age.  3. Hyperlipidemia:  On Zocor, will get recent lipids from Dr. Baldwin Crown office.   4. Mobitz type I 2nd degree AV block: Today has a profound 1st degree AV block (same as last appointment) with mild bradycardia.  Also has  LBBB.  Will continue to follow given no symptoms.  No beta blocker.  Will need to watch for worsening of conduction disease.  5. Daytime sleepiness: He wants to hold off on sleep study.  Followup in 6 months at [facility] with me.   [name] 08/02/2016

## 2016-08-10 ENCOUNTER — Other Ambulatory Visit: Payer: Self-pay | Admitting: Cardiology

## 2016-10-17 DIAGNOSIS — M47814 Spondylosis without myelopathy or radiculopathy, thoracic region: Secondary | ICD-10-CM | POA: Diagnosis not present

## 2016-10-17 DIAGNOSIS — M9902 Segmental and somatic dysfunction of thoracic region: Secondary | ICD-10-CM | POA: Diagnosis not present

## 2016-10-17 DIAGNOSIS — M4004 Postural kyphosis, thoracic region: Secondary | ICD-10-CM | POA: Diagnosis not present

## 2016-10-17 DIAGNOSIS — M9903 Segmental and somatic dysfunction of lumbar region: Secondary | ICD-10-CM | POA: Diagnosis not present

## 2016-10-17 DIAGNOSIS — M47816 Spondylosis without myelopathy or radiculopathy, lumbar region: Secondary | ICD-10-CM | POA: Diagnosis not present

## 2016-10-18 DIAGNOSIS — M9903 Segmental and somatic dysfunction of lumbar region: Secondary | ICD-10-CM | POA: Diagnosis not present

## 2016-10-18 DIAGNOSIS — M4004 Postural kyphosis, thoracic region: Secondary | ICD-10-CM | POA: Diagnosis not present

## 2016-10-18 DIAGNOSIS — M47814 Spondylosis without myelopathy or radiculopathy, thoracic region: Secondary | ICD-10-CM | POA: Diagnosis not present

## 2016-10-18 DIAGNOSIS — M47816 Spondylosis without myelopathy or radiculopathy, lumbar region: Secondary | ICD-10-CM | POA: Diagnosis not present

## 2016-10-18 DIAGNOSIS — M9902 Segmental and somatic dysfunction of thoracic region: Secondary | ICD-10-CM | POA: Diagnosis not present

## 2016-11-06 DIAGNOSIS — R05 Cough: Secondary | ICD-10-CM | POA: Diagnosis not present

## 2016-11-06 DIAGNOSIS — J069 Acute upper respiratory infection, unspecified: Secondary | ICD-10-CM | POA: Diagnosis not present

## 2016-11-06 DIAGNOSIS — Z6825 Body mass index (BMI) 25.0-25.9, adult: Secondary | ICD-10-CM | POA: Diagnosis not present

## 2017-02-01 ENCOUNTER — Ambulatory Visit (HOSPITAL_COMMUNITY)
Admission: RE | Admit: 2017-02-01 | Discharge: 2017-02-01 | Disposition: A | Discharge status: Home / Self Care | Payer: PPO | Source: Ambulatory Visit | Attending: Cardiology | Admitting: Cardiology

## 2017-02-01 ENCOUNTER — Encounter (HOSPITAL_COMMUNITY): Payer: Self-pay

## 2017-02-01 VITALS — BP 126/60 | HR 52 | Wt 163.2 lb

## 2017-02-01 DIAGNOSIS — E785 Hyperlipidemia, unspecified: Secondary | ICD-10-CM

## 2017-02-01 DIAGNOSIS — I252 Old myocardial infarction: Secondary | ICD-10-CM | POA: Diagnosis not present

## 2017-02-01 DIAGNOSIS — I255 Ischemic cardiomyopathy: Secondary | ICD-10-CM

## 2017-02-01 DIAGNOSIS — G4710 Hypersomnia, unspecified: Secondary | ICD-10-CM | POA: Insufficient documentation

## 2017-02-01 DIAGNOSIS — Z79899 Other long term (current) drug therapy: Secondary | ICD-10-CM | POA: Diagnosis not present

## 2017-02-01 DIAGNOSIS — Z8249 Family history of ischemic heart disease and other diseases of the circulatory system: Secondary | ICD-10-CM | POA: Insufficient documentation

## 2017-02-01 DIAGNOSIS — N189 Chronic kidney disease, unspecified: Secondary | ICD-10-CM | POA: Diagnosis not present

## 2017-02-01 DIAGNOSIS — I5022 Chronic systolic (congestive) heart failure: Secondary | ICD-10-CM | POA: Diagnosis not present

## 2017-02-01 DIAGNOSIS — Z87442 Personal history of urinary calculi: Secondary | ICD-10-CM | POA: Diagnosis not present

## 2017-02-01 DIAGNOSIS — Z7982 Long term (current) use of aspirin: Secondary | ICD-10-CM | POA: Insufficient documentation

## 2017-02-01 DIAGNOSIS — I440 Atrioventricular block, first degree: Secondary | ICD-10-CM | POA: Insufficient documentation

## 2017-02-01 DIAGNOSIS — I2510 Atherosclerotic heart disease of native coronary artery without angina pectoris: Secondary | ICD-10-CM | POA: Diagnosis not present

## 2017-02-01 LAB — BASIC METABOLIC PANEL
Anion gap: 6 (ref 5–15)
BUN: 16 mg/dL (ref 6–20)
CALCIUM: 9.2 mg/dL (ref 8.9–10.3)
CO2: 26 mmol/L (ref 22–32)
CREATININE: 1.22 mg/dL (ref 0.61–1.24)
Chloride: 108 mmol/L (ref 101–111)
GFR, EST AFRICAN AMERICAN: 57 mL/min — AB (ref >60)
GFR, EST NON AFRICAN AMERICAN: 49 mL/min — AB (ref >60)
Glucose, Bld: 87 mg/dL (ref 65–99)
Potassium: 3.9 mmol/L (ref 3.5–5.1)
SODIUM: 140 mmol/L (ref 135–145)

## 2017-02-01 LAB — LIPID PANEL
CHOL/HDL RATIO: 3.5 ratio
Cholesterol: 168 mg/dL (ref 0–200)
HDL: 48 mg/dL (ref >40)
LDL CALC: 106 mg/dL — AB (ref 0–99)
Triglycerides: 71 mg/dL (ref <150)
VLDL: 14 mg/dL (ref 0–40)

## 2017-02-01 MED ORDER — FUROSEMIDE 20 MG PO TABS: 20.0000 mg | ORAL_TABLET | Freq: Every day | ORAL | 6 refills | Status: DC

## 2017-02-01 NOTE — Patient Instructions (Signed)
Routine lab work today. Will notify you of abnormal results, otherwise no news is good news!  INCREASE Lasix to 20 mg (1 whole tablet) once daily.  Follow up 6 months with Dr. Aundra Dubin.  Do the following things EVERYDAY: 1) Weigh yourself in the morning before breakfast. Write it down and keep it in a log. 2) Take your medicines as prescribed 3) Eat low salt foods-Limit salt (sodium) to 2000 mg per day.  4) Stay as active as you can everyday 5) Limit all fluids for the day to less than 2 liters

## 2017-02-02 NOTE — Progress Notes (Signed)
Patient ID: Mark Lane, male   DOB: 1922-06-28, 83 y.o.   MRN: [id number] PCP: Dr. Forde Dandy Cardiology: Dr. Aundra Dubin  83 yo with history of CAD and ischemic cardiomyopathy presents for cardiology followup.  He had an anterolateral STEMI in 7/11 with ostial occlusion of a large 2nd diagonal that was treated with BMS.  EF immediately post-MI was 35%.  He has also been noted to have Mobitz Type I 2nd degree AV block at times.  Last echo in 7/14 showed EF 30-35% with septal/apical akinesis and anterior hypokinesis.    Mr Mark Lane has been stable symptomatically.  He is active though not as much as in the past.  His wife has been ill and he has been taking care of her.  No chest pain or NTG use.  Exertional dyspnea only with heavy activity/fast walking, notes walking uphill to his mailbox.  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  No lightheadedness or syncope.  No orthopnea or PND.  He is sleepy during the day at times, denies snoring. He fatigues more easily than in the past.  Weight is up 4 lbs.   Labs (6/13): LDL 68, HDL 40 Labs (2/14): K 3.9, creatinine 1.2 Labs (8/14): K 4.1, creatinine 1.4 Labs (12/14): creatinine 1.3 Labs (4/15): K 4.9, creatinine 1.92, BNP 494.6 Labs (9/15): K 3.9, creatinine 1.4, LDL 133, HDL 43 Labs (3/16): K 3.9, creatinine 1.22, LDL 76 Labs (10/16): K 3.8, creatinine 1.15  PMH: 1. Hyperlipidemia 2. Nephrolithiasis 3. CAD: Anterolateral STEMI in 7/11.  LHC with ostial total occlusion of a large D2, dominant LCx, small RCA.  Patient had BMS to D2.  4. Profound 1st degree AV block.  Mobitz type I 2nd degree AV block. 5. Ischemic cardiomyopathy: Echo (7/11) with EF 35% and peri-apical akinesis, grade II diastolic dysfunction, normal RV size and systolic function.  Echo (7/14) with EF 30-35%, septal/apical akinesis, anterior hypokinesis, mild MR/AI.  6. CKD  SH: Married, lives in [place], prior tobacco.  Has a farm, grows peaches.   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 aspirin 81 MG tablet Take 81 mg by mouth daily.      . cholecalciferol (VITAMIN D) 1000 UNITS tablet Take 1,000 Units by mouth daily. 2 tablets    . furosemide (LASIX) 20 MG tablet Take 1 tablet (20 mg total) by mouth daily. 30 tablet 6  . lisinopril (PRINIVIL,ZESTRIL) 10 MG tablet Take 1 tablet (10 mg total) by mouth daily. 30 tablet 11  . simvastatin (ZOCOR) 20 MG tablet TAKE ONE TABLET AT BEDTIME 90 tablet 0   No current facility-administered medications for this encounter.    BP 126/60   Pulse (!) 52   Wt 163 lb 4 oz (74 kg)   SpO2 98%   BMI 25.57 kg/m  General: NAD Neck: JVP 8 cm, no thyromegaly or thyroid nodule.  Lungs: Clear to auscultation bilaterally with normal respiratory effort. CV: Nondisplaced PMI.  Heart regular S1/S2, no S3/S4, no murmur.  1+ ankle edema.  No carotid bruit.  Normal pedal pulses.  Abdomen: Soft, nontender, no hepatosplenomegaly, no distention.  Skin: Intact without lesions or rashes.  Neurologic: Alert and oriented x 3.  Psych: Normal affect. Extremities: No clubbing or cyanosis.   Assessment/Plan: 1. CAD: No ischemic symptoms.  Continue ASA 81 and statin.  2. Chronic systolic CHF: Ischemic cardiomyopathy. EF 30-35% on last echo, no  significant change from post-MI echo in 2011.  He is not on a beta blocker because of history of Mobitz type I 2nd degree AVB and current profound 1st degree AV block and bradycardia.  He appears mildly volume overloaded on exam, NYHA class II symptoms.  - Increase Lasix to 20 mg daily. BMET today. - Continue lisinopril.  - No ICD given age.  3. Hyperlipidemia:  On Zocor, check lipids today.   4. Mobitz type I 2nd degree AV block: Today has a profound 1st degree AV block (same as last appointment) with mild bradycardia.  Also has LBBB.  Will continue to follow given no symptoms.  No beta blocker.  Will need to watch for  worsening of conduction disease.  5. Daytime sleepiness: He wants to hold off on sleep study.  Followup in 6 months    [name] 02/02/2017

## 2017-02-06 DIAGNOSIS — R002 Palpitations: Secondary | ICD-10-CM | POA: Diagnosis not present

## 2017-02-06 DIAGNOSIS — R7301 Impaired fasting glucose: Secondary | ICD-10-CM | POA: Diagnosis not present

## 2017-02-06 DIAGNOSIS — I2510 Atherosclerotic heart disease of native coronary artery without angina pectoris: Secondary | ICD-10-CM | POA: Diagnosis not present

## 2017-02-06 DIAGNOSIS — I129 Hypertensive chronic kidney disease with stage 1 through stage 4 chronic kidney disease, or unspecified chronic kidney disease: Secondary | ICD-10-CM | POA: Diagnosis not present

## 2017-02-06 DIAGNOSIS — Z1389 Encounter for screening for other disorder: Secondary | ICD-10-CM | POA: Diagnosis not present

## 2017-02-06 DIAGNOSIS — E784 Other hyperlipidemia: Secondary | ICD-10-CM | POA: Diagnosis not present

## 2017-02-06 DIAGNOSIS — Z6825 Body mass index (BMI) 25.0-25.9, adult: Secondary | ICD-10-CM | POA: Diagnosis not present

## 2017-02-06 DIAGNOSIS — I255 Ischemic cardiomyopathy: Secondary | ICD-10-CM | POA: Diagnosis not present

## 2017-02-06 DIAGNOSIS — N200 Calculus of kidney: Secondary | ICD-10-CM | POA: Diagnosis not present

## 2017-02-12 ENCOUNTER — Encounter (HOSPITAL_COMMUNITY): Payer: Self-pay | Admitting: *Deleted

## 2017-02-20 ENCOUNTER — Telehealth (HOSPITAL_COMMUNITY): Payer: Self-pay | Admitting: *Deleted

## 2017-02-20 DIAGNOSIS — E7849 Other hyperlipidemia: Secondary | ICD-10-CM

## 2017-02-20 MED ORDER — SIMVASTATIN 20 MG PO TABS: 40.0000 mg | ORAL_TABLET | Freq: Every day | ORAL | 3 refills | Status: DC

## 2017-02-20 NOTE — Telephone Encounter (Signed)
Patient called back and he understands to increase his simvastatin to 40 mg Daily.  Medication sent to pharmacy.  He is also agreeable to come back for labs in 3 months.  Appointment scheduled and lab orders placed.     Basic metabolic panel  Order: [id number]  Status:  Final result Visible to patient:  No (Not Released) Next appt:  None Dx:  Cardiomyopathy, ischemic  Notes recorded by Scarlette Calico, RN on 02/13/2017 at 2:59 PM EDT Per Dr Aundra Dubin, error was made as pt is not on Pravastatin, he wants pt increase Simvastatin to 40 mg daily, recheck lipids in 3 months, will await a call back from pt. ------  Notes recorded by Harvie Junior, CMA on 02/12/2017 at 3:30 PM EDT No answer letter mailed. ------  Notes recorded by Harvie Junior, CMA on 02/07/2017 at 4:01 PM EDT No answer line busy will try patient again tomorrow to review lab results. ------  Notes recorded by Harvie Junior, CMA on 02/05/2017 at 4:24 PM EDT Needs to increase his simvastatin to 40 mg daily with lipids/LFTs in 2 months. Per [name] disregard the increase to 80mg  ------  Notes recorded by Harvie Junior, [place] on 02/05/2017 at 4:19 PM EDT No answer no VM will try pt again tomorrow. ------  Notes recorded by Larey Dresser, MD on 02/01/2017 at 11:30 PM EDT LDL a bit higher than goal, would increase pravastatin to 80 mg daily.

## 2017-03-11 DIAGNOSIS — H43813 Vitreous degeneration, bilateral: Secondary | ICD-10-CM | POA: Diagnosis not present

## 2017-03-11 DIAGNOSIS — H52203 Unspecified astigmatism, bilateral: Secondary | ICD-10-CM | POA: Diagnosis not present

## 2017-03-11 DIAGNOSIS — Z961 Presence of intraocular lens: Secondary | ICD-10-CM | POA: Diagnosis not present

## 2017-03-11 DIAGNOSIS — H353132 Nonexudative age-related macular degeneration, bilateral, intermediate dry stage: Secondary | ICD-10-CM | POA: Diagnosis not present

## 2017-05-22 ENCOUNTER — Ambulatory Visit (HOSPITAL_COMMUNITY)
Admission: RE | Admit: 2017-05-22 | Discharge: 2017-05-22 | Disposition: A | Discharge status: Home / Self Care | Payer: PPO | Source: Ambulatory Visit | Attending: Cardiology | Admitting: Cardiology

## 2017-05-22 DIAGNOSIS — E784 Other hyperlipidemia: Secondary | ICD-10-CM | POA: Diagnosis not present

## 2017-05-22 DIAGNOSIS — E7849 Other hyperlipidemia: Secondary | ICD-10-CM

## 2017-05-22 LAB — HEPATIC FUNCTION PANEL
ALBUMIN: 3.7 g/dL (ref 3.5–5.0)
ALK PHOS: 56 U/L (ref 38–126)
ALT: 12 U/L — ABNORMAL LOW (ref 17–63)
AST: 23 U/L (ref 15–41)
Bilirubin, Direct: <0.1 mg/dL — ABNORMAL LOW (ref 0.1–0.5)
Total Bilirubin: 0.8 mg/dL (ref 0.3–1.2)
Total Protein: 6.9 g/dL (ref 6.5–8.1)

## 2017-05-22 LAB — LIPID PANEL
CHOLESTEROL: 106 mg/dL (ref 0–200)
HDL: 43 mg/dL (ref >40)
LDL Cholesterol: 43 mg/dL (ref 0–99)
Total CHOL/HDL Ratio: 2.5 RATIO
Triglycerides: 98 mg/dL (ref <150)
VLDL: 20 mg/dL (ref 0–40)

## 2017-08-02 ENCOUNTER — Encounter (HOSPITAL_COMMUNITY): Payer: PPO | Admitting: Cardiology

## 2017-08-05 ENCOUNTER — Encounter (HOSPITAL_COMMUNITY): Payer: Self-pay | Admitting: Cardiology

## 2017-08-05 ENCOUNTER — Ambulatory Visit (HOSPITAL_COMMUNITY)
Admission: RE | Admit: 2017-08-05 | Discharge: 2017-08-05 | Disposition: A | Discharge status: Home / Self Care | Payer: PPO | Source: Ambulatory Visit | Attending: Cardiology | Admitting: Cardiology

## 2017-08-05 VITALS — BP 122/58 | HR 62 | Wt 157.2 lb

## 2017-08-05 DIAGNOSIS — Z8249 Family history of ischemic heart disease and other diseases of the circulatory system: Secondary | ICD-10-CM | POA: Diagnosis not present

## 2017-08-05 DIAGNOSIS — N189 Chronic kidney disease, unspecified: Secondary | ICD-10-CM | POA: Diagnosis not present

## 2017-08-05 DIAGNOSIS — Z79899 Other long term (current) drug therapy: Secondary | ICD-10-CM | POA: Diagnosis not present

## 2017-08-05 DIAGNOSIS — Z7982 Long term (current) use of aspirin: Secondary | ICD-10-CM | POA: Insufficient documentation

## 2017-08-05 DIAGNOSIS — I447 Left bundle-branch block, unspecified: Secondary | ICD-10-CM | POA: Insufficient documentation

## 2017-08-05 DIAGNOSIS — I441 Atrioventricular block, second degree: Secondary | ICD-10-CM | POA: Diagnosis not present

## 2017-08-05 DIAGNOSIS — I5022 Chronic systolic (congestive) heart failure: Secondary | ICD-10-CM | POA: Insufficient documentation

## 2017-08-05 DIAGNOSIS — I255 Ischemic cardiomyopathy: Secondary | ICD-10-CM | POA: Insufficient documentation

## 2017-08-05 DIAGNOSIS — I252 Old myocardial infarction: Secondary | ICD-10-CM | POA: Diagnosis not present

## 2017-08-05 DIAGNOSIS — E785 Hyperlipidemia, unspecified: Secondary | ICD-10-CM | POA: Diagnosis not present

## 2017-08-05 DIAGNOSIS — I2510 Atherosclerotic heart disease of native coronary artery without angina pectoris: Secondary | ICD-10-CM | POA: Insufficient documentation

## 2017-08-05 DIAGNOSIS — I440 Atrioventricular block, first degree: Secondary | ICD-10-CM | POA: Diagnosis not present

## 2017-08-05 DIAGNOSIS — Z95810 Presence of automatic (implantable) cardiac defibrillator: Secondary | ICD-10-CM | POA: Diagnosis not present

## 2017-08-05 LAB — BASIC METABOLIC PANEL
ANION GAP: 6 (ref 5–15)
BUN: 15 mg/dL (ref 6–20)
CALCIUM: 9.4 mg/dL (ref 8.9–10.3)
CO2: 30 mmol/L (ref 22–32)
CREATININE: 1.17 mg/dL (ref 0.61–1.24)
Chloride: 103 mmol/L (ref 101–111)
GFR, EST NON AFRICAN AMERICAN: 52 mL/min — AB (ref >60)
GLUCOSE: 89 mg/dL (ref 65–99)
Potassium: 4.1 mmol/L (ref 3.5–5.1)
Sodium: 139 mmol/L (ref 135–145)

## 2017-08-05 LAB — BRAIN NATRIURETIC PEPTIDE: B Natriuretic Peptide: 298.4 pg/mL — ABNORMAL HIGH (ref 0.0–100.0)

## 2017-08-05 NOTE — Patient Instructions (Signed)
Labs drawn today (if we do not call you, then your lab work was stable)   Your physician recommends that you schedule a follow-up appointment in: 6 months   

## 2017-08-06 NOTE — Progress Notes (Signed)
Patient ID: Mark Lane, male   DOB: November 15, 1921, 83 y.o.   MRN: [id number] PCP: Dr. Forde Dandy Cardiology: Dr. Aundra Dubin  83 yo with history of CAD and ischemic cardiomyopathy presents for followup of CAD and CHF.  He had an anterolateral STEMI in 7/11 with ostial occlusion of a large 2nd diagonal that was treated with BMS.  EF immediately post-MI was 35%.  He has also been noted to have Mobitz Type I 2nd degree AV block at times.  Last echo in 7/14 showed EF 30-35% with septal/apical akinesis and anterior hypokinesis.    Patient has been fairly stable symptomatically.  He keeps busy taking care of his wife is s/p leg amputation and not very mobile.  Feels like his energy level is not great, but this has been chronic.  No chest pain.  No lightheadedness.  No exertional dyspnea.  He still takes care of his peach [place] and mows the yard.  Weight is down 6 lbs.   Labs (6/13): LDL 68, HDL 40 Labs (2/14): K 3.9, creatinine 1.2 Labs (8/14): K 4.1, creatinine 1.4 Labs (12/14): creatinine 1.3 Labs (4/15): K 4.9, creatinine 1.92, BNP 494.6 Labs (9/15): K 3.9, creatinine 1.4, LDL 133, HDL 43 Labs (3/16): K 3.9, creatinine 1.22, LDL 76 Labs (10/16): K 3.8, creatinine 1.15 Labs (3/18): K 3.4, creatinine 1.22 Labs (7/18): LDL 43, HDL 43, LFTs normal  ECG (personally reviewed): NSR, long 1st degree AV block with type 1 2nd degree AV block.   PMH: 1. Hyperlipidemia 2. Nephrolithiasis 3. CAD: Anterolateral STEMI in 7/11.  LHC with ostial total occlusion of a large D2, dominant LCx, small RCA.  Patient had BMS to D2.  4. Profound 1st degree AV block.  Mobitz type I 2nd degree AV block. 5. Ischemic cardiomyopathy: Echo (7/11) with EF 35% and peri-apical akinesis, grade II diastolic dysfunction, normal RV size and systolic function.  Echo (7/14) with EF 30-35%, septal/apical akinesis, anterior hypokinesis, mild MR/AI.  6. CKD  SH: Married, lives in [place], prior tobacco.  Has a farm, grows peaches.   FH:  CAD  ROS: All systems reviewed and negative except as per HPI  Current Outpatient Prescriptions  Medication Sig Dispense Refill  . aspirin 81 MG tablet Take 81 mg by mouth daily.      . cholecalciferol (VITAMIN D) 1000 UNITS tablet Take 1,000 Units by mouth daily. 2 tablets    . furosemide (LASIX) 20 MG tablet Take 1 tablet (20 mg total) by mouth daily. 30 tablet 6  . lisinopril (PRINIVIL,ZESTRIL) 10 MG tablet Take 1 tablet (10 mg total) by mouth daily. 30 tablet 11  . simvastatin (ZOCOR) 20 MG tablet Take 2 tablets (40 mg total) by mouth at bedtime. 180 tablet 3   No current facility-administered medications for this encounter.    BP (!) 122/58   Pulse 62   Wt 157 lb 4 oz (71.3 kg)   SpO2 100%   BMI 24.63 kg/m  General: NAD Neck: No JVD, no thyromegaly or thyroid nodule.  Lungs: Clear to auscultation bilaterally with normal respiratory effort. CV: Nondisplaced PMI.  Heart regular S1/S2, no S3/S4, no murmur.  1+ ankle edema bilaterally.  No carotid bruit.  Normal pedal pulses.  Abdomen: Soft, nontender, no hepatosplenomegaly, no distention.  Skin: Intact without lesions or rashes.  Neurologic: Alert and oriented x 3.  Psych: Normal affect. Extremities: No clubbing or cyanosis.  HEENT: Normal.    Assessment/Plan: 1. CAD: No ischemic symptoms.  Continue ASA 81 and statin.  2. Chronic systolic CHF: Ischemic cardiomyopathy. EF 30-35% on last echo, no significant change from post-MI echo in 2011.  He is not on a beta blocker because of history of Mobitz type I 2nd degree AVB and profound 1st degree AV block and bradycardia.  Volume status looks ok on exam, NYHA class II symptoms.   - Continue Lasix 20 mg daily, BMET today.  - Continue lisinopril.  - No ICD given age.  3. Hyperlipidemia:  On Zocor, good lipids recently.   4. Mobitz type I 2nd degree AV block + profound 1st degree AV block:  Also has LBBB.  Will continue to follow given no symptoms (no lightheadedness/syncope).  No  beta blocker.  Will need to watch for worsening of conduction disease.  5. Daytime sleepiness: He wants to hold off on sleep study.  Followup in 6 months    [name] 08/06/2017

## 2017-08-20 DIAGNOSIS — E7849 Other hyperlipidemia: Secondary | ICD-10-CM | POA: Diagnosis not present

## 2017-08-20 DIAGNOSIS — I2510 Atherosclerotic heart disease of native coronary artery without angina pectoris: Secondary | ICD-10-CM | POA: Diagnosis not present

## 2017-08-20 DIAGNOSIS — Z6825 Body mass index (BMI) 25.0-25.9, adult: Secondary | ICD-10-CM | POA: Diagnosis not present

## 2017-08-20 DIAGNOSIS — N200 Calculus of kidney: Secondary | ICD-10-CM | POA: Diagnosis not present

## 2017-08-20 DIAGNOSIS — Z23 Encounter for immunization: Secondary | ICD-10-CM | POA: Diagnosis not present

## 2017-08-20 DIAGNOSIS — I255 Ischemic cardiomyopathy: Secondary | ICD-10-CM | POA: Diagnosis not present

## 2017-08-20 DIAGNOSIS — I129 Hypertensive chronic kidney disease with stage 1 through stage 4 chronic kidney disease, or unspecified chronic kidney disease: Secondary | ICD-10-CM | POA: Diagnosis not present

## 2017-08-20 DIAGNOSIS — R7301 Impaired fasting glucose: Secondary | ICD-10-CM | POA: Diagnosis not present

## 2017-09-27 ENCOUNTER — Other Ambulatory Visit: Payer: Self-pay | Admitting: Cardiology

## 2017-11-11 DIAGNOSIS — H35373 Puckering of macula, bilateral: Secondary | ICD-10-CM | POA: Diagnosis not present

## 2017-11-11 DIAGNOSIS — H5315 Visual distortions of shape and size: Secondary | ICD-10-CM | POA: Diagnosis not present

## 2017-11-11 DIAGNOSIS — Z961 Presence of intraocular lens: Secondary | ICD-10-CM | POA: Diagnosis not present

## 2018-01-20 ENCOUNTER — Ambulatory Visit (HOSPITAL_COMMUNITY)
Admission: RE | Admit: 2018-01-20 | Discharge: 2018-01-20 | Disposition: A | Discharge status: Home / Self Care | Payer: PPO | Source: Ambulatory Visit | Attending: Cardiology | Admitting: Cardiology

## 2018-01-20 ENCOUNTER — Other Ambulatory Visit: Payer: Self-pay

## 2018-01-20 VITALS — BP 112/52 | HR 59 | Wt 160.5 lb

## 2018-01-20 DIAGNOSIS — E785 Hyperlipidemia, unspecified: Secondary | ICD-10-CM | POA: Insufficient documentation

## 2018-01-20 DIAGNOSIS — Z8249 Family history of ischemic heart disease and other diseases of the circulatory system: Secondary | ICD-10-CM | POA: Diagnosis not present

## 2018-01-20 DIAGNOSIS — I255 Ischemic cardiomyopathy: Secondary | ICD-10-CM | POA: Diagnosis not present

## 2018-01-20 DIAGNOSIS — I5022 Chronic systolic (congestive) heart failure: Secondary | ICD-10-CM | POA: Insufficient documentation

## 2018-01-20 DIAGNOSIS — N189 Chronic kidney disease, unspecified: Secondary | ICD-10-CM | POA: Insufficient documentation

## 2018-01-20 DIAGNOSIS — R5382 Chronic fatigue, unspecified: Secondary | ICD-10-CM | POA: Diagnosis not present

## 2018-01-20 DIAGNOSIS — Z7982 Long term (current) use of aspirin: Secondary | ICD-10-CM | POA: Diagnosis not present

## 2018-01-20 DIAGNOSIS — I252 Old myocardial infarction: Secondary | ICD-10-CM | POA: Insufficient documentation

## 2018-01-20 DIAGNOSIS — I428 Other cardiomyopathies: Secondary | ICD-10-CM | POA: Insufficient documentation

## 2018-01-20 DIAGNOSIS — I2510 Atherosclerotic heart disease of native coronary artery without angina pectoris: Secondary | ICD-10-CM | POA: Insufficient documentation

## 2018-01-20 DIAGNOSIS — I441 Atrioventricular block, second degree: Secondary | ICD-10-CM | POA: Diagnosis not present

## 2018-01-20 LAB — BASIC METABOLIC PANEL
Anion gap: 8 (ref 5–15)
BUN: 20 mg/dL (ref 6–20)
CHLORIDE: 103 mmol/L (ref 101–111)
CO2: 27 mmol/L (ref 22–32)
Calcium: 9.1 mg/dL (ref 8.9–10.3)
Creatinine, Ser: 1.33 mg/dL — ABNORMAL HIGH (ref 0.61–1.24)
GFR calc non Af Amer: 44 mL/min — ABNORMAL LOW (ref >60)
GFR, EST AFRICAN AMERICAN: 51 mL/min — AB (ref >60)
Glucose, Bld: 95 mg/dL (ref 65–99)
POTASSIUM: 3.9 mmol/L (ref 3.5–5.1)
SODIUM: 138 mmol/L (ref 135–145)

## 2018-01-20 NOTE — Patient Instructions (Addendum)
Labs drawn today (if we do not call you, then your lab work was stable)   Your physician recommends that you schedule a follow-up appointment in: 6 months (September, 2019) Please Call an Schedule Appointment    

## 2018-01-21 NOTE — Progress Notes (Signed)
Patient ID: Mark Lane, male   DOB: 07/10/1922, 84 y.o.   MRN: [id number] PCP: Dr. Forde Dandy Cardiology: Dr. Aundra Dubin  84 y.o.with history of CAD and ischemic cardiomyopathy presents for followup of CAD and CHF.  He had an anterolateral STEMI in 7/11 with ostial occlusion of a large 2nd diagonal that was treated with BMS.  EF immediately post-MI was 35%.  He has also been noted to have Mobitz Type I 2nd degree AV block at times.  Last echo in 7/14 showed EF 30-35% with septal/apical akinesis and anterior hypokinesis.    No complaints today other than chronic fatigue.  He keeps busy taking care of his wife who is s/p leg amputation and not very mobile.  No chest pain.  No exertional dyspnea. Plans to do more work outside in the spring.  Weight is up 3 lbs.  He is in NSR today with profound 1st degree AV block.  No syncope or palpitations.    Labs (6/13): LDL 68, HDL 40 Labs (2/14): K 3.9, creatinine 1.2 Labs (8/14): K 4.1, creatinine 1.4 Labs (12/14): creatinine 1.3 Labs (4/15): K 4.9, creatinine 1.92, BNP 494.6 Labs (9/15): K 3.9, creatinine 1.4, LDL 133, HDL 43 Labs (3/16): K 3.9, creatinine 1.22, LDL 76 Labs (10/16): K 3.8, creatinine 1.15 Labs (3/18): K 3.4, creatinine 1.22 Labs (7/18): LDL 43, HDL 43, LFTs normal Labs (10/18): K 4.1, creatinine 1.17, BNP 298  ECG (personally reviewed): NSR, long 1st degree AVB, IVCD with QRS 154 msec  PMH: 1. Hyperlipidemia 2. Nephrolithiasis 3. CAD: Anterolateral STEMI in 7/11.  LHC with ostial total occlusion of a large D2, dominant LCx, small RCA.  Patient had BMS to D2.  4. Profound 1st degree AV block.  Mobitz type I 2nd degree AV block. 5. Ischemic cardiomyopathy: Echo (7/11) with EF 35% and peri-apical akinesis, grade II diastolic dysfunction, normal RV size and systolic function.  Echo (7/14) with EF 30-35%, septal/apical akinesis, anterior hypokinesis, mild MR/AI.  6. CKD  SH: Married, lives in [place], prior tobacco.  Has a farm, grows peaches.    FH: CAD  ROS: All systems reviewed and negative except as per HPI  Current Outpatient Medications  Medication Sig Dispense Refill  . aspirin 81 MG tablet Take 81 mg by mouth daily.      . cholecalciferol (VITAMIN D) 1000 UNITS tablet Take 1,000 Units by mouth daily. 2 tablets    . furosemide (LASIX) 20 MG tablet Take 1 tablet (20 mg total) by mouth daily. 30 tablet 6  . lisinopril (PRINIVIL,ZESTRIL) 10 MG tablet TAKE 1 TABLET DAILY 30 tablet 3  . simvastatin (ZOCOR) 20 MG tablet Take 2 tablets (40 mg total) by mouth at bedtime. 180 tablet 3   No current facility-administered medications for this encounter.    BP (!) 112/52   Pulse (!) 59   Wt 160 lb 8 oz (72.8 kg)   SpO2 99%   BMI 25.14 kg/m  General: NAD Neck: No JVD, no thyromegaly or thyroid nodule.  Lungs: Clear to auscultation bilaterally with normal respiratory effort. CV: Nondisplaced PMI.  Heart regular S1/S2, no S3/S4, no murmur.  Trace ankle edema.  No carotid bruit.  Normal pedal pulses.  Abdomen: Soft, nontender, no hepatosplenomegaly, no distention.  Skin: Intact without lesions or rashes.  Neurologic: Alert and oriented x 3.  Psych: Normal affect. Extremities: No clubbing or cyanosis.  HEENT: Normal.   Assessment/Plan: 1. CAD: No ischemic symptoms.  Continue ASA 81 and statin.  2. Chronic systolic CHF:  Ischemic cardiomyopathy. EF 30-35% on last echo, no significant change from post-MI echo in 2011.  He is not on a beta blocker because of history of Mobitz type I 2nd degree AVB and profound 1st degree AV block and bradycardia.  Volume status looks ok on exam, NYHA class II symptoms.   - Continue Lasix 20 mg daily, BMET today.   - Continue lisinopril.  - No ICD given age.  3. Hyperlipidemia:  On Zocor.   4. Mobitz type I 2nd degree AV block + profound 1st degree AV block:  Also has LBBB/IVCD.  Will continue to follow given no symptoms (no lightheadedness/syncope).  No beta blocker.  Will need to watch for  worsening of conduction disease.  5. Daytime sleepiness: He wants to hold off on sleep study.  Followup in 6 months    [name] 01/21/2018

## 2018-02-18 DIAGNOSIS — E7849 Other hyperlipidemia: Secondary | ICD-10-CM | POA: Diagnosis not present

## 2018-02-18 DIAGNOSIS — I2510 Atherosclerotic heart disease of native coronary artery without angina pectoris: Secondary | ICD-10-CM | POA: Diagnosis not present

## 2018-02-18 DIAGNOSIS — Z6824 Body mass index (BMI) 24.0-24.9, adult: Secondary | ICD-10-CM | POA: Diagnosis not present

## 2018-02-18 DIAGNOSIS — R7301 Impaired fasting glucose: Secondary | ICD-10-CM | POA: Diagnosis not present

## 2018-02-18 DIAGNOSIS — N183 Chronic kidney disease, stage 3 (moderate): Secondary | ICD-10-CM | POA: Diagnosis not present

## 2018-02-18 DIAGNOSIS — N200 Calculus of kidney: Secondary | ICD-10-CM | POA: Diagnosis not present

## 2018-02-18 DIAGNOSIS — I255 Ischemic cardiomyopathy: Secondary | ICD-10-CM | POA: Diagnosis not present

## 2018-02-25 ENCOUNTER — Other Ambulatory Visit (HOSPITAL_COMMUNITY): Payer: Self-pay | Admitting: Cardiology

## 2018-07-16 ENCOUNTER — Other Ambulatory Visit: Payer: Self-pay | Admitting: Internal Medicine

## 2018-08-13 DIAGNOSIS — H52203 Unspecified astigmatism, bilateral: Secondary | ICD-10-CM | POA: Diagnosis not present

## 2018-08-13 DIAGNOSIS — H43813 Vitreous degeneration, bilateral: Secondary | ICD-10-CM | POA: Diagnosis not present

## 2018-08-13 DIAGNOSIS — Z961 Presence of intraocular lens: Secondary | ICD-10-CM | POA: Diagnosis not present

## 2018-08-13 DIAGNOSIS — H35373 Puckering of macula, bilateral: Secondary | ICD-10-CM | POA: Diagnosis not present

## 2018-08-18 DIAGNOSIS — I255 Ischemic cardiomyopathy: Secondary | ICD-10-CM | POA: Diagnosis not present

## 2018-08-18 DIAGNOSIS — Z6824 Body mass index (BMI) 24.0-24.9, adult: Secondary | ICD-10-CM | POA: Diagnosis not present

## 2018-08-18 DIAGNOSIS — E7849 Other hyperlipidemia: Secondary | ICD-10-CM | POA: Diagnosis not present

## 2018-08-18 DIAGNOSIS — I2510 Atherosclerotic heart disease of native coronary artery without angina pectoris: Secondary | ICD-10-CM | POA: Diagnosis not present

## 2018-08-18 DIAGNOSIS — N183 Chronic kidney disease, stage 3 (moderate): Secondary | ICD-10-CM | POA: Diagnosis not present

## 2018-08-18 DIAGNOSIS — N200 Calculus of kidney: Secondary | ICD-10-CM | POA: Diagnosis not present

## 2018-08-18 DIAGNOSIS — R600 Localized edema: Secondary | ICD-10-CM | POA: Diagnosis not present

## 2018-08-18 DIAGNOSIS — Z23 Encounter for immunization: Secondary | ICD-10-CM | POA: Diagnosis not present

## 2018-08-18 DIAGNOSIS — I129 Hypertensive chronic kidney disease with stage 1 through stage 4 chronic kidney disease, or unspecified chronic kidney disease: Secondary | ICD-10-CM | POA: Diagnosis not present

## 2018-08-18 DIAGNOSIS — D649 Anemia, unspecified: Secondary | ICD-10-CM | POA: Diagnosis not present

## 2018-08-18 DIAGNOSIS — R7301 Impaired fasting glucose: Secondary | ICD-10-CM | POA: Diagnosis not present

## 2018-12-05 ENCOUNTER — Telehealth (HOSPITAL_COMMUNITY): Payer: Self-pay

## 2018-12-05 ENCOUNTER — Ambulatory Visit (HOSPITAL_COMMUNITY)
Admission: RE | Admit: 2018-12-05 | Discharge: 2018-12-05 | Disposition: A | Discharge status: Home / Self Care | Payer: PPO | Source: Ambulatory Visit | Attending: Cardiology | Admitting: Cardiology

## 2018-12-05 ENCOUNTER — Encounter (HOSPITAL_COMMUNITY): Payer: Self-pay | Admitting: Cardiology

## 2018-12-05 VITALS — BP 125/74 | HR 58 | Wt 160.4 lb

## 2018-12-05 DIAGNOSIS — E785 Hyperlipidemia, unspecified: Secondary | ICD-10-CM | POA: Diagnosis not present

## 2018-12-05 DIAGNOSIS — I5022 Chronic systolic (congestive) heart failure: Secondary | ICD-10-CM | POA: Diagnosis not present

## 2018-12-05 DIAGNOSIS — I255 Ischemic cardiomyopathy: Secondary | ICD-10-CM | POA: Diagnosis not present

## 2018-12-05 DIAGNOSIS — Z7982 Long term (current) use of aspirin: Secondary | ICD-10-CM | POA: Insufficient documentation

## 2018-12-05 DIAGNOSIS — I2510 Atherosclerotic heart disease of native coronary artery without angina pectoris: Secondary | ICD-10-CM | POA: Diagnosis not present

## 2018-12-05 DIAGNOSIS — I252 Old myocardial infarction: Secondary | ICD-10-CM | POA: Insufficient documentation

## 2018-12-05 DIAGNOSIS — I447 Left bundle-branch block, unspecified: Secondary | ICD-10-CM | POA: Insufficient documentation

## 2018-12-05 DIAGNOSIS — Z8249 Family history of ischemic heart disease and other diseases of the circulatory system: Secondary | ICD-10-CM | POA: Diagnosis not present

## 2018-12-05 DIAGNOSIS — R9431 Abnormal electrocardiogram [ECG] [EKG]: Secondary | ICD-10-CM | POA: Diagnosis not present

## 2018-12-05 DIAGNOSIS — I441 Atrioventricular block, second degree: Secondary | ICD-10-CM | POA: Diagnosis not present

## 2018-12-05 DIAGNOSIS — Z79899 Other long term (current) drug therapy: Secondary | ICD-10-CM | POA: Insufficient documentation

## 2018-12-05 DIAGNOSIS — I440 Atrioventricular block, first degree: Secondary | ICD-10-CM

## 2018-12-05 DIAGNOSIS — N189 Chronic kidney disease, unspecified: Secondary | ICD-10-CM | POA: Insufficient documentation

## 2018-12-05 DIAGNOSIS — R001 Bradycardia, unspecified: Secondary | ICD-10-CM | POA: Insufficient documentation

## 2018-12-05 LAB — LIPID PANEL
CHOLESTEROL: 158 mg/dL (ref 0–200)
HDL: 46 mg/dL (ref >40)
LDL Cholesterol: 99 mg/dL (ref 0–99)
Total CHOL/HDL Ratio: 3.4 RATIO
Triglycerides: 67 mg/dL (ref <150)
VLDL: 13 mg/dL (ref 0–40)

## 2018-12-05 LAB — BASIC METABOLIC PANEL
Anion gap: 8 (ref 5–15)
BUN: 18 mg/dL (ref 8–23)
CHLORIDE: 105 mmol/L (ref 98–111)
CO2: 27 mmol/L (ref 22–32)
CREATININE: 1.2 mg/dL (ref 0.61–1.24)
Calcium: 9.1 mg/dL (ref 8.9–10.3)
GFR calc non Af Amer: 51 mL/min — ABNORMAL LOW (ref >60)
GFR, EST AFRICAN AMERICAN: 60 mL/min — AB (ref >60)
Glucose, Bld: 96 mg/dL (ref 70–99)
POTASSIUM: 5 mmol/L (ref 3.5–5.1)
Sodium: 140 mmol/L (ref 135–145)

## 2018-12-05 NOTE — Patient Instructions (Signed)
Labs were done today. We will call you with any ABNORMAL results. No news is good news!  EKG was competed.  Your physician wants you to follow-up in: 6 MONTHS You will receive a reminder letter in the mail two months in advance. If you don't receive a letter, please call our office to schedule the follow-up appointment.

## 2018-12-05 NOTE — Telephone Encounter (Signed)
EKG in office noted to be Bradycardic and 1st degree AV Block. Per Dr Aundra Dubin pt to RTO on Monday for long term monitor to be worn for 3 days. Marland Kitchen Appointment made for Monday.  Per Dr. Aundra Dubin, pt also referred to EP. Pt advised of all, states understanding of all information.

## 2018-12-07 NOTE — Progress Notes (Signed)
Patient ID: Mark Lane, male   DOB: [birth year], 85 y.o.    MRN: [id number] PCP: Dr. Forde Dandy Cardiology: Dr. Aundra Dubin  85 y.o.with history of CAD and ischemic cardiomyopathy presents for followup of CAD and CHF.  He had an anterolateral STEMI in 7/11 with ostial occlusion of a large 2nd diagonal that was treated with BMS.  EF immediately post-MI was 35%.  He has also been noted to have Mobitz Type I 2nd degree AV block at times.  Last echo in 7/14 showed EF 30-35% with septal/apical akinesis and anterior hypokinesis.    He is stable symptomatically.  Looks after his wife who is primarily wheelchair-bound. He has not been as active this winter as in the past.  No lightheadedness, syncope, or falls.  No chest pain.  No dyspnea with ADLs.   1st ECG today showed long 1st degree AVB with rate 45, 2nd ECG showed type 1 2nd degree AVB with rate 36.  He was asymptomatic.     Labs (6/13): LDL 68, HDL 40 Labs (2/14): K 3.9, creatinine 1.2 Labs (8/14): K 4.1, creatinine 1.4 Labs (12/14): creatinine 1.3 Labs (4/15): K 4.9, creatinine 1.92, BNP 494.6 Labs (9/15): K 3.9, creatinine 1.4, LDL 133, HDL 43 Labs (3/16): K 3.9, creatinine 1.22, LDL 76 Labs (10/16): K 3.8, creatinine 1.15 Labs (3/18): K 3.4, creatinine 1.22 Labs (7/18): LDL 43, HDL 43, LFTs normal Labs (10/18): K 4.1, creatinine 1.17, BNP 298  ECG (personally reviewed):  #1: NSR, long 1st degree AVB, LBBB, rate 45 #2: NSR, type 1 2nd degree AVB, LBBB, rate 36  PMH: 1. Hyperlipidemia 2. Nephrolithiasis 3. CAD: Anterolateral STEMI in 7/11.  LHC with ostial total occlusion of a large D2, dominant LCx, small RCA.  Patient had BMS to D2.  4. Profound 1st degree AV block.  Mobitz type I 2nd degree AV block. 5. Ischemic cardiomyopathy: Echo (7/11) with EF 35% and peri-apical akinesis, grade II diastolic dysfunction, normal RV size and systolic function.  Echo (7/14) with EF 30-35%, septal/apical akinesis, anterior hypokinesis, mild MR/AI.  6.  CKD  SH: Married, lives in [place], prior tobacco.  Has a farm, grows peaches.   FH: CAD  ROS: All systems reviewed and negative except as per HPI  Current Outpatient Medications  Medication Sig Dispense Refill  . aspirin 81 MG tablet Take 81 mg by mouth daily.      . furosemide (LASIX) 20 MG tablet Take 1 tablet (20 mg total) by mouth daily. 30 tablet 11  . lisinopril (PRINIVIL,ZESTRIL) 10 MG tablet TAKE 1 TABLET DAILY 30 tablet 3  . cholecalciferol (VITAMIN D) 1000 UNITS tablet Take 1,000 Units by mouth daily. 2 tablets    . simvastatin (ZOCOR) 20 MG tablet Take 2 tablets (40 mg total) by mouth at bedtime. (Patient taking differently: Take 40 mg by mouth at bedtime. Pt states he takes this seldomly) 180 tablet 3   No current facility-administered medications for this encounter.    BP 125/74   Pulse (!) 58   Wt 72.8 kg (160 lb 6.4 oz)   SpO2 97%   BMI 25.12 kg/m  General: NAD Neck: No JVD, no thyromegaly or thyroid nodule.  Lungs: Clear to auscultation bilaterally with normal respiratory effort. CV: Nondisplaced PMI.  Heart bradycardic, regular S1/S2, no S3/S4, no murmur.  1+ ankle edema.  No carotid bruit.  Normal pedal pulses.  Abdomen: Soft, nontender, no hepatosplenomegaly, no distention.  Skin: Intact without lesions or rashes.  Neurologic: Alert and oriented x 3.  Psych: Normal affect. Extremities: No clubbing or cyanosis.  HEENT: Normal.   Assessment/Plan: 1. CAD: No ischemic symptoms.  Continue ASA 81 and statin.  2. Chronic systolic CHF: Ischemic cardiomyopathy. EF 30-35% on last echo, no significant change from post-MI echo in 2011.  He is not on a beta blocker because of history of Mobitz type I 2nd degree AVB and profound 1st degree AV block and bradycardia.  Volume status looks ok on exam, NYHA class II symptoms.   - Continue Lasix 20 mg daily, BMET today.   - Continue lisinopril.  - No ICD given age.  3. Hyperlipidemia:  On Zocor, he has not been taking this  regulary.  - Needs to take Zocor daily.  - Check lipids today.  4. Mobitz type I 2nd degree AV block + profound 1st degree AV block:  Also has LBBB.  Both rhythms seen today.  HR was noted to get very slow today, down to 36.  He denies symptoms of bradycardia.  However, I am concerned that he is nearing the need for a pacemaker.  - I will arrange for a 3 day Zio patch.  - I will refer him to EP.  5. Daytime sleepiness: He wants to hold off on sleep study.  Followup in 6 months    [name] 12/07/2018

## 2018-12-08 ENCOUNTER — Ambulatory Visit (HOSPITAL_COMMUNITY)
Admission: RE | Admit: 2018-12-08 | Discharge: 2018-12-08 | Disposition: A | Discharge status: Home / Self Care | Payer: PPO | Source: Ambulatory Visit | Attending: Cardiology | Admitting: Cardiology

## 2018-12-08 DIAGNOSIS — I440 Atrioventricular block, first degree: Secondary | ICD-10-CM

## 2018-12-17 ENCOUNTER — Other Ambulatory Visit (HOSPITAL_COMMUNITY): Payer: Self-pay | Admitting: *Deleted

## 2018-12-17 DIAGNOSIS — I440 Atrioventricular block, first degree: Secondary | ICD-10-CM

## 2018-12-19 ENCOUNTER — Ambulatory Visit: Payer: PPO | Admitting: Internal Medicine

## 2018-12-19 ENCOUNTER — Encounter: Payer: Self-pay | Admitting: Internal Medicine

## 2018-12-19 VITALS — BP 124/56 | HR 42 | Ht 67.0 in | Wt 160.0 lb

## 2018-12-19 DIAGNOSIS — I459 Conduction disorder, unspecified: Secondary | ICD-10-CM | POA: Diagnosis not present

## 2018-12-19 LAB — CBC WITH DIFFERENTIAL/PLATELET
BASOS ABS: 0.1 10*3/uL (ref 0.0–0.2)
Basos: 1 %
EOS (ABSOLUTE): 0.3 10*3/uL (ref 0.0–0.4)
Eos: 4 %
Hematocrit: 36.7 % — ABNORMAL LOW (ref 37.5–51.0)
Hemoglobin: 12.2 g/dL — ABNORMAL LOW (ref 13.0–17.7)
IMMATURE GRANS (ABS): 0 10*3/uL (ref 0.0–0.1)
Immature Granulocytes: 0 %
LYMPHS: 38 %
Lymphocytes Absolute: 2.8 10*3/uL (ref 0.7–3.1)
MCH: 30.7 pg (ref 26.6–33.0)
MCHC: 33.2 g/dL (ref 31.5–35.7)
MCV: 92 fL (ref 79–97)
MONOS ABS: 0.6 10*3/uL (ref 0.1–0.9)
Monocytes: 8 %
NEUTROS PCT: 49 %
Neutrophils Absolute: 3.6 10*3/uL (ref 1.4–7.0)
PLATELETS: 187 10*3/uL (ref 150–450)
RBC: 3.97 x10E6/uL — ABNORMAL LOW (ref 4.14–5.80)
RDW: 12.5 % (ref 11.6–15.4)
WBC: 7.4 10*3/uL (ref 3.4–10.8)

## 2018-12-19 NOTE — Progress Notes (Signed)
HPI Mr. Mark Lane is referred today by Dr. Aundra Dubin for evaluation of CHB. He is a pleasant 85 yo man with a h/o CHB as well as high grade AV block, LV dysfunction, remote STEMI, EF 30%. He has class 2 symptoms, with mostly low output in nature. He moves very slow. He has LBBB at baseline. He has not had frank syncope although he does get lightheaded.  No Known Allergies   Current Outpatient Medications  Medication Sig Dispense Refill  . aspirin 81 MG tablet Take 81 mg by mouth daily.      . cholecalciferol (VITAMIN D) 1000 UNITS tablet Take 1,000 Units by mouth daily. 2 tablets    . furosemide (LASIX) 20 MG tablet Take 1 tablet (20 mg total) by mouth daily. 30 tablet 11  . lisinopril (PRINIVIL,ZESTRIL) 10 MG tablet TAKE 1 TABLET DAILY 30 tablet 3  . simvastatin (ZOCOR) 20 MG tablet Take 2 tablets (40 mg total) by mouth at bedtime. (Patient taking differently: Take 40 mg by mouth at bedtime. Pt states he takes this seldomly) 180 tablet 3   No current facility-administered medications for this visit.      Past Medical History:  Diagnosis Date  . Coronary artery disease   . Hyperlipidemia     ROS:   All systems reviewed and negative except as noted in the HPI.   Past Surgical History:  Procedure Laterality Date  . CARDIAC CATHETERIZATION    . HEMORRHOID SURGERY     removed a hemorrhoid in the 1970s  . KIDNEY STONE SURGERY     remove a kidney stone     Family History  Problem Relation Age of Onset  . Heart attack Father        in his 65's  . Cancer Brother        1/2  . Cancer Sister   . Heart Problems Brother 27       pacemaker 2/2     Social History   Socioeconomic History  . Marital status: Married    Spouse name: Not on file  . Number of children: Not on file  . Years of education: Not on file  . Highest education level: Not on file  Occupational History  . Not on file  Social Needs  . Financial resource strain: Not on file  . Food insecurity:   Worry: Not on file    Inability: Not on file  . Transportation needs:    Medical: Not on file    Non-medical: Not on file  Tobacco Use  . Smoking status: Former Research scientist (life sciences)  . Smokeless tobacco: Never Used  Substance and Sexual Activity  . Alcohol use: No  . Drug use: No  . Sexual activity: Not on file  Lifestyle  . Physical activity:    Days per week: Not on file    Minutes per session: Not on file  . Stress: Not on file  Relationships  . Social connections:    Talks on phone: Not on file    Gets together: Not on file    Attends religious service: Not on file    Active member of club or organization: Not on file    Attends meetings of clubs or organizations: Not on file    Relationship status: Not on file  . Intimate partner violence:    Fear of current or ex partner: Not on file    Emotionally abused: Not on file    Physically abused: Not on  file    Forced sexual activity: Not on file  Other Topics Concern  . Not on file  Social History Narrative   He is married, has 1-son. He does not smoke, or use alcohol     BP (!) 124/56   Pulse (!) 42   Ht 5\' 7"  (1.702 m)   Wt 160 lb (72.6 kg)   SpO2 99%   BMI 25.06 kg/m   Physical Exam:  Well appearing NAD HEENT: Unremarkable Neck:  No JVD, no thyromegally Lymphatics:  No adenopathy Back:  No CVA tenderness Lungs:  Clear with no wheezes HEART:  IRegular brady rhythm, no murmurs, no rubs, no clicks Abd:  soft, positive bowel sounds, no organomegally, no rebound, no guarding Ext:  2 plus pulses, no edema, no cyanosis, no clubbing Skin:  No rashes no nodules Neuro:  CN II through XII intact, motor grossly intact  EKG - reviewe - NSR with AVWB and LBBB  Zio patch - reviewed - he has CHB and 2:1 and AVWB all during the daytime  Assess/Plan: 1. High grade AV block - I have discussed the treatment options with the patient and recommended proceeding with PPM insertion. With his LV dysfunction, preserved mentation, and  certainty of pacing 282% of the time, we will plan to place a LV lead.  2. Chronic systolic heart failure - his symptoms are limited by his low output primarily.   Mikle Bosworth.D.

## 2018-12-19 NOTE — Patient Instructions (Addendum)
Medication Instructions:  Your physician recommends that you continue on your current medications as directed. Please refer to the Current Medication list given to you today.  Labwork: You will get lab work today:  BMP and CBC.  Testing/Procedures: Your physician has recommended that you have a pacemaker inserted. A pacemaker is a small device that is placed under the skin of your chest or abdomen to help control abnormal heart rhythms. This device uses electrical pulses to prompt the heart to beat at a normal rate. Pacemakers are used to treat heart rhythms that are too slow. Wire (leads) are attached to the pacemaker that goes into the chambers of you heart. This is done in the hospital and usually requires and overnight stay. Please see the instruction sheet given to you today for more information.  Follow-Up: You will follow up with device clinic 10-14 days after your procedure for a wound check.  You will follow up with Dr. Lovena Le 91 days after your procedure.    PACEMAKER INSTRUCTIONS:  Please arrive TO ADMITTING down the hall from the [facility] main entrance of [place] [facility] at:  7:30 am on December 22, 2018 Use the CHG surgical scrub as directed Do not eat or drink after midnight prior to procedure Take your normal morning medications with a sip of water EXCEPT for:  [place] for one night stay You will need someone to drive you home at discharge  If you need a refill on your cardiac medications before your next appointment, please call your pharmacy.    Pacemaker Implantation, Adult Pacemaker implantation is a procedure to place a pacemaker inside your chest. A pacemaker is a small computer that sends electrical signals to the heart and helps your heart beat normally. A pacemaker also stores information about your heart rhythms. You may need pacemaker implantation if you:  Have a slow heartbeat (bradycardia).  Faint (syncope).  Have shortness of breath (dyspnea)  due to heart problems. The pacemaker attaches to your heart through a wire, called a lead. Sometimes just one lead is needed. Other times, there will be two leads. There are two types of pacemakers:  Transvenous pacemaker. This type is placed under the skin or muscle of your chest. The lead goes through a vein in the chest area to reach the inside of the heart.  Epicardial pacemaker. This type is placed under the skin or muscle of your chest or belly. The lead goes through your chest to the outside of the heart. Tell a health care provider about:  Any allergies you have.  All medicines you are taking, including vitamins, herbs, eye drops, creams, and over-the-counter medicines.  Any problems you or family members have had with anesthetic medicines.  Any blood or bone disorders you have.  Any surgeries you have had.  Any medical conditions you have.  Whether you are pregnant or may be pregnant. What are the risks? Generally, this is a safe procedure. However, problems may occur, including:  Infection.  Bleeding.  Failure of the pacemaker or the lead.  Collapse of a lung or bleeding into a lung.  Blood clot inside a blood vessel with a lead.  Damage to the heart.  Infection inside the heart (endocarditis).  Allergic reactions to medicines. What happens before the procedure? Staying hydrated Follow instructions from your health care provider about hydration, which may include:  Up to 2 hours before the procedure - you may continue to drink clear liquids, such as water, clear fruit juice,  black coffee, and plain tea. Eating and drinking restrictions Follow instructions from your health care provider about eating and drinking, which may include:  8 hours before the procedure - stop eating heavy meals or foods such as meat, fried foods, or fatty foods.  6 hours before the procedure - stop eating light meals or foods, such as toast or cereal.  6 hours before the procedure  - stop drinking milk or drinks that contain milk.  2 hours before the procedure - stop drinking clear liquids. Medicines  Ask your health care provider about: ? Changing or stopping your regular medicines. This is especially important if you are taking diabetes medicines or blood thinners. ? Taking medicines such as aspirin and ibuprofen. These medicines can thin your blood. Do not take these medicines before your procedure if your health care provider instructs you not to.  You may be given antibiotic medicine to help prevent infection. General instructions  You will have a heart evaluation. This may include an electrocardiogram (ECG), chest X-ray, and heart imaging (echocardiogram,  or echo) tests.  You will have blood tests.  Do not use any products that contain nicotine or tobacco, such as cigarettes and e-cigarettes. If you need help quitting, ask your health care provider.  Plan to have someone take you home from the hospital or clinic.  If you will be going home right after the procedure, plan to have someone with you for 24 hours.  Ask your health care provider how your surgical site will be marked or identified. What happens during the procedure?  To reduce your risk of infection: ? Your health care team will wash or sanitize their hands. ? Your skin will be washed with soap. ? Hair may be removed from the surgical area.  An IV tube will be inserted into one of your veins.  You will be given one or more of the following: ? A medicine to help you relax (sedative). ? A medicine to numb the area (local anesthetic). ? A medicine to make you fall asleep (general anesthetic).  If you are getting a transvenous pacemaker: ? An incision will be made in your upper chest. ? A pocket will be made for the pacemaker. It may be placed under the skin or between layers of muscle. ? The lead will be inserted into a blood vessel that returns to the heart. ? While X-rays are taken by an  imaging machine (fluoroscopy), the lead will be advanced through the vein to the inside of your heart. ? The other end of the lead will be tunneled under the skin and attached to the pacemaker.  If you are getting an epicardial pacemaker: ? An incision will be made near your ribs or breastbone (sternum) for the lead. ? The lead will be attached to the outside of your heart. ? Another incision will be made in your chest or upper belly to create a pocket for the pacemaker. ? The free end of the lead will be tunneled under the skin and attached to the pacemaker.  The transvenous or epicardial pacemaker will be tested. Imaging studies may be done to check the lead position.  The incisions will be closed with stitches (sutures), adhesive strips, or skin glue.  Bandages (dressing) will be placed over the incisions. The procedure may vary among health care providers and hospitals. What happens after the procedure?  Your blood pressure, heart rate, breathing rate, and blood oxygen level will be monitored until the medicines you were  given have worn off.  You will be given antibiotics and pain medicine.  ECG and chest x-rays will be done.  You will wear a continuous type of ECG (Holter monitor) to check your heart rhythm.  Your health care provider will program the pacemaker.  Do not drive for 24 hours if you received a sedative. This information is not intended to replace advice given to you by your health care provider. Make sure you discuss any questions you have with your health care provider. Document Released: 10/12/2002 Document Revised: 07/11/2018 Document Reviewed: 04/04/2016 Elsevier Interactive Patient Education  2019 [employer].

## 2018-12-22 ENCOUNTER — Encounter (HOSPITAL_COMMUNITY): Payer: Self-pay | Admitting: Internal Medicine

## 2018-12-22 ENCOUNTER — Ambulatory Visit (HOSPITAL_COMMUNITY)
Admission: RE | Admit: 2018-12-22 | Discharge: 2018-12-23 | Disposition: A | Discharge status: Home / Self Care | Payer: PPO | Attending: Internal Medicine | Admitting: Internal Medicine

## 2018-12-22 ENCOUNTER — Encounter (HOSPITAL_COMMUNITY)
Admission: RE | Disposition: A | Discharge status: Home / Self Care | Payer: Self-pay | Source: Home / Self Care | Attending: Internal Medicine

## 2018-12-22 ENCOUNTER — Other Ambulatory Visit: Payer: Self-pay

## 2018-12-22 DIAGNOSIS — I252 Old myocardial infarction: Secondary | ICD-10-CM | POA: Diagnosis not present

## 2018-12-22 DIAGNOSIS — E785 Hyperlipidemia, unspecified: Secondary | ICD-10-CM | POA: Insufficient documentation

## 2018-12-22 DIAGNOSIS — Z7982 Long term (current) use of aspirin: Secondary | ICD-10-CM | POA: Insufficient documentation

## 2018-12-22 DIAGNOSIS — Z79899 Other long term (current) drug therapy: Secondary | ICD-10-CM | POA: Diagnosis not present

## 2018-12-22 DIAGNOSIS — N189 Chronic kidney disease, unspecified: Secondary | ICD-10-CM | POA: Diagnosis not present

## 2018-12-22 DIAGNOSIS — I2510 Atherosclerotic heart disease of native coronary artery without angina pectoris: Secondary | ICD-10-CM | POA: Insufficient documentation

## 2018-12-22 DIAGNOSIS — I447 Left bundle-branch block, unspecified: Secondary | ICD-10-CM | POA: Insufficient documentation

## 2018-12-22 DIAGNOSIS — I130 Hypertensive heart and chronic kidney disease with heart failure and stage 1 through stage 4 chronic kidney disease, or unspecified chronic kidney disease: Secondary | ICD-10-CM | POA: Insufficient documentation

## 2018-12-22 DIAGNOSIS — Z87891 Personal history of nicotine dependence: Secondary | ICD-10-CM | POA: Insufficient documentation

## 2018-12-22 DIAGNOSIS — I255 Ischemic cardiomyopathy: Secondary | ICD-10-CM | POA: Diagnosis not present

## 2018-12-22 DIAGNOSIS — Z8249 Family history of ischemic heart disease and other diseases of the circulatory system: Secondary | ICD-10-CM | POA: Insufficient documentation

## 2018-12-22 DIAGNOSIS — I5022 Chronic systolic (congestive) heart failure: Secondary | ICD-10-CM

## 2018-12-22 DIAGNOSIS — Z950 Presence of cardiac pacemaker: Secondary | ICD-10-CM

## 2018-12-22 DIAGNOSIS — I442 Atrioventricular block, complete: Secondary | ICD-10-CM | POA: Diagnosis not present

## 2018-12-22 HISTORY — PX: BIV PACEMAKER INSERTION CRT-P: EP1199

## 2018-12-22 LAB — SURGICAL PCR SCREEN
MRSA, PCR: NEGATIVE
Staphylococcus aureus: NEGATIVE

## 2018-12-22 SURGERY — BIV PACEMAKER INSERTION CRT-P

## 2018-12-22 MED ORDER — CHLORHEXIDINE GLUCONATE 4 % EX LIQD
60.0000 mL | Freq: Once | CUTANEOUS | Status: DC
  Filled 2018-12-22: qty 60

## 2018-12-22 MED ORDER — FENTANYL CITRATE (PF) 100 MCG/2ML IJ SOLN
INTRAMUSCULAR | Status: AC
  Filled 2018-12-22: qty 2

## 2018-12-22 MED ORDER — MIDAZOLAM HCL 5 MG/5ML IJ SOLN
INTRAMUSCULAR | Status: AC
  Filled 2018-12-22: qty 5

## 2018-12-22 MED ORDER — HEPARIN (PORCINE) IN NACL 1000-0.9 UT/500ML-% IV SOLN
INTRAVENOUS | Status: DC | PRN
  Administered 2018-12-22: 500 mL

## 2018-12-22 MED ORDER — ASPIRIN EC 81 MG PO TBEC
81.0000 mg | DELAYED_RELEASE_TABLET | Freq: Every day | ORAL | Status: DC
  Administered 2018-12-22 – 2018-12-23 (×2): 81 mg via ORAL
  Filled 2018-12-22 (×2): qty 1

## 2018-12-22 MED ORDER — SODIUM CHLORIDE 0.9 % IV SOLN
INTRAVENOUS | Status: DC | PRN
  Administered 2018-12-22 – 2018-12-23 (×2): 250 mL via INTRAVENOUS

## 2018-12-22 MED ORDER — SODIUM CHLORIDE 0.9 % IV SOLN
80.0000 mg | INTRAVENOUS | Status: AC
  Administered 2018-12-22: 80 mg
  Filled 2018-12-22: qty 2

## 2018-12-22 MED ORDER — SODIUM CHLORIDE 0.9 % IV SOLN
INTRAVENOUS | Status: AC
  Filled 2018-12-22: qty 2

## 2018-12-22 MED ORDER — ACETAMINOPHEN 325 MG PO TABS: 325.0000 mg | ORAL_TABLET | ORAL | Status: DC | PRN

## 2018-12-22 MED ORDER — CEFAZOLIN SODIUM-DEXTROSE 2-4 GM/100ML-% IV SOLN
2.0000 g | INTRAVENOUS | Status: AC
  Administered 2018-12-22: 2 g via INTRAVENOUS
  Filled 2018-12-22: qty 100

## 2018-12-22 MED ORDER — LIDOCAINE HCL (PF) 1 % IJ SOLN
INTRAMUSCULAR | Status: DC | PRN
  Administered 2018-12-22: 60 mL

## 2018-12-22 MED ORDER — FUROSEMIDE 20 MG PO TABS
20.0000 mg | ORAL_TABLET | Freq: Every day | ORAL | Status: DC
  Administered 2018-12-22 – 2018-12-23 (×2): 20 mg via ORAL
  Filled 2018-12-22 (×2): qty 1

## 2018-12-22 MED ORDER — HEPARIN (PORCINE) IN NACL 1000-0.9 UT/500ML-% IV SOLN
INTRAVENOUS | Status: AC
  Filled 2018-12-22: qty 500

## 2018-12-22 MED ORDER — LIDOCAINE HCL (PF) 1 % IJ SOLN
INTRAMUSCULAR | Status: AC
  Filled 2018-12-22: qty 60

## 2018-12-22 MED ORDER — MUPIROCIN 2 % EX OINT
1.0000 "application " | TOPICAL_OINTMENT | Freq: Once | CUTANEOUS | Status: AC
  Administered 2018-12-22: 1 via TOPICAL
  Filled 2018-12-22: qty 22

## 2018-12-22 MED ORDER — IOPAMIDOL (ISOVUE-370) INJECTION 76%
INTRAVENOUS | Status: AC
  Filled 2018-12-22: qty 50

## 2018-12-22 MED ORDER — SODIUM CHLORIDE 0.9 % IV SOLN
INTRAVENOUS | Status: DC | PRN
  Administered 2018-12-22: 13:00:00

## 2018-12-22 MED ORDER — CEFAZOLIN SODIUM-DEXTROSE 2-4 GM/100ML-% IV SOLN
INTRAVENOUS | Status: AC
  Filled 2018-12-22: qty 100

## 2018-12-22 MED ORDER — SODIUM CHLORIDE 0.9 % IV SOLN
INTRAVENOUS | Status: DC
  Administered 2018-12-22: 08:00:00 via INTRAVENOUS

## 2018-12-22 MED ORDER — CEFAZOLIN SODIUM-DEXTROSE 1-4 GM/50ML-% IV SOLN
1.0000 g | Freq: Four times a day (QID) | INTRAVENOUS | Status: AC
  Administered 2018-12-22 – 2018-12-23 (×3): 1 g via INTRAVENOUS
  Filled 2018-12-22 (×3): qty 50

## 2018-12-22 MED ORDER — LISINOPRIL 10 MG PO TABS
10.0000 mg | ORAL_TABLET | Freq: Every day | ORAL | Status: DC
  Administered 2018-12-22 – 2018-12-23 (×2): 10 mg via ORAL
  Filled 2018-12-22 (×2): qty 1

## 2018-12-22 MED ORDER — ONDANSETRON HCL 4 MG/2ML IJ SOLN: 4.0000 mg | Freq: Four times a day (QID) | INTRAMUSCULAR | Status: DC | PRN

## 2018-12-22 MED ORDER — IOPAMIDOL (ISOVUE-370) INJECTION 76%
INTRAVENOUS | Status: DC | PRN
  Administered 2018-12-22: 20 mL via INTRAVENOUS

## 2018-12-22 MED ORDER — VITAMIN D 25 MCG (1000 UNIT) PO TABS
1000.0000 [IU] | ORAL_TABLET | Freq: Every day | ORAL | Status: DC
  Administered 2018-12-22 – 2018-12-23 (×2): 1000 [IU] via ORAL
  Filled 2018-12-22 (×2): qty 1

## 2018-12-22 MED ORDER — MUPIROCIN 2 % EX OINT
TOPICAL_OINTMENT | CUTANEOUS | Status: AC
  Administered 2018-12-22: 1 via TOPICAL
  Filled 2018-12-22: qty 22

## 2018-12-22 SURGICAL SUPPLY — 19 items
ADAPTER SEALING SSA-EW-09 (MISCELLANEOUS) ×1 IMPLANT
ADPR INTRO LNG 9FR SL XTD WNG (MISCELLANEOUS) ×1
CABLE SURGICAL S-101-97-12 (CABLE) ×2 IMPLANT
CATH ATTAIN COM SURV 6250V-MB2 (CATHETERS) ×1 IMPLANT
CATH HEX JOS 2-5-2 65CM 6F REP (CATHETERS) ×1 IMPLANT
DEVICE CRTP PERCEPTA QUAD MRI (Pacemaker) ×1 IMPLANT
DEVICE ONE SNARE 10MM (MISCELLANEOUS) ×1 IMPLANT
DEVICE ONE SNARE 15MM (VASCULAR PRODUCTS) ×1 IMPLANT
KIT ESSENTIALS PG (KITS) ×1 IMPLANT
LEAD ATTAIN PERFORM ST 4398-88 (Lead) ×1 IMPLANT
LEAD CAPSURE NOVUS 5076-52CM (Lead) ×1 IMPLANT
LEAD CAPSURE NOVUS 5076-58CM (Lead) ×1 IMPLANT
PAD DEFIB LIFELINK (PAD) ×1 IMPLANT
SHEATH CLASSIC 7F (SHEATH) ×3 IMPLANT
SHEATH CLASSIC 9.5F (SHEATH) ×1 IMPLANT
SLITTER 6232ADJ (MISCELLANEOUS) ×1 IMPLANT
TRAY PACEMAKER INSERTION (PACKS) ×2 IMPLANT
WIRE ACUITY WHISPER EDS 4648 (WIRE) ×1 IMPLANT
WIRE ASAHI SION 190X3X12 .014 (WIRE) ×1 IMPLANT

## 2018-12-22 NOTE — Progress Notes (Signed)
Received the pt on the unit at 3E13. AOx4, pacemaker site clean, dry and intact. Educated the pt to limit movement with LUE. Will continue to monitor.

## 2018-12-22 NOTE — Discharge Summary (Addendum)
ELECTROPHYSIOLOGY PROCEDURE DISCHARGE SUMMARY    Patient ID: Mark Lane,  MRN: [id number], DOB/AGE: 83-Mar-1923 83 y.o.  Admit date: 12/22/2018 Discharge date: 12/23/2018  Primary Care Physician: Reynold Bowen, MD  Primary Cardiologist: Dr. Aundra Dubin Electrophysiologist: Dr. Lovena Le  Primary Discharge Diagnosis:  1. High degree AVBlock 2. LBBB 3. ICM 4. Chronic CHF (systolic)  Secondary Discharge Diagnosis:  1. CAD 2. HTN 3. CKD  No Known Allergies   Procedures This Admission:  1.  Implantation of a MDT CRT-PPM on 12/22/2018 by Dr Lovena Le.  The patient received a Medtronic model [id number] (serial number [id number]) right atrial lead and a Medtronic model 5076 (serial number [id number]) right ventricular lead, medtronic(serial number [id number]) lead There were no immediate post procedure complications. 2.  CXR on 10/23/2019 demonstrated no pneumothorax status post device implantation.   Brief HPI: Mark Lane is a 85 y.o. male was referred to electrophysiology in the outpatient setting for consideration of PPM implantation.  Past medical history includes above.  The patient has had symptomatic bradycardia , chronic CHF, ICM, and LBBB.  Risks, benefits, and alternatives to CRT-PPM implantation were reviewed with the patient who wished to proceed.   Hospital Course:  The patient was admitted and underwent implantation of a PPM with details as outlined above.  He was monitored on telemetry overnight which demonstrated SR/V paced.  Left chest was without hematoma or ecchymosis.  The device was interrogated and found to be functioning normally.  CXR was obtained and demonstrated no pneumothorax status post device implantation.  Wound care, arm mobility, and restrictions were reviewed with the patient.  The patient feels well this morning, denies any CP or SOB, minimal site discomfort.  He was examined by Dr. Lovena Le and considered stable for discharge to home.    Physical Exam: Vitals:    12/22/18 1701 12/22/18 1927 12/23/18 0512 12/23/18 0937  BP: 133/79 140/69 127/60 105/64  Pulse: 60 60 60 65  Resp:  20 18 18   Temp:  97.9 F (36.6 C) 98 F (36.7 C) 98.1 F (36.7 C)  TempSrc:  Oral  Oral  SpO2:  97% 96% 97%  Weight:      Height:        GEN- The patient is well appearing, alert and oriented x 3 today.   HEENT: normocephalic, atraumatic; sclera clear, conjunctiva pink; hearing intact; oropharynx clear; neck supple, no JVP Lungs- CTA b/l, normal work of breathing.  No wheezes, rales, rhonchi Heart- RRR, no murmurs, rubs or gallops, PMI not laterally displaced GI- soft, non-tender, non-distended Extremities- no clubbing, cyanosis, or edema MS- no significant deformity or atrophy Skin- warm and dry, no rash or lesion, left chest without hematoma/ecchymosis Psych- euthymic mood, full affect Neuro- no gross deficits   Labs:   Lab Results  Component Value Date   WBC 7.4 12/19/2018   HGB 12.2 (L) 12/19/2018   HCT 36.7 (L) 12/19/2018   MCV 92 12/19/2018   PLT 187 12/19/2018   No results for input(s): NA, K, CL, CO2, BUN, CREATININE, CALCIUM, PROT, BILITOT, ALKPHOS, ALT, AST, GLUCOSE in the last 168 hours.  Invalid input(s): LABALBU  Discharge Medications:  Allergies as of 12/23/2018   No Known Allergies     Medication List    TAKE these medications   aspirin 81 MG tablet Take 81 mg by mouth daily.   carvedilol 3.125 MG tablet Commonly known as:  COREG Take 1 tablet (3.125 mg total) by mouth 2 (two) times daily.   cholecalciferol 1000  units tablet Commonly known as:  VITAMIN D Take 1,000 Units by mouth daily. 2 tablets   furosemide 20 MG tablet Commonly known as:  LASIX Take 1 tablet (20 mg total) by mouth daily.   lisinopril 10 MG tablet Commonly known as:  PRINIVIL,ZESTRIL TAKE 1 TABLET DAILY   simvastatin 20 MG tablet Commonly known as:  ZOCOR Take 2 tablets (40 mg total) by mouth at bedtime. What changed:  additional instructions         Disposition:  Home  Discharge Instructions    Diet - low sodium heart healthy   Complete by:  As directed    Increase activity slowly   Complete by:  As directed      Follow-up Information    [place] [facility] Follow up.   Specialty:  Cardiology Why:  01/05/2019 @ 1:00PM, wound check visist Contact information: [address] [place]       Evans Lance, MD Follow up.   Specialty:  Cardiology Why:  03/31/2019 @ 4:15PM Contact information: [address] [address] [phone]           Duration of Discharge Encounter: Greater than 30 minutes including physician time.  Venetia Night, PA-C 12/23/2018 9:39 AM  EP Attending  Patient seen and examined. Agree with the findings as noted above. The patient is doing well after PPM insertion. PPM interogation under my supervision demonstrates normal device function although his LV threshold is a bit elevated. His lead is unchanged from implant position. He will be discharged home. Usual followup.   Mikle Bosworth.D.

## 2018-12-22 NOTE — H&P (Signed)
EP Attending  HPI Mr. Mark Lane is referred today by Dr. Aundra Dubin for evaluation of CHB. He is a pleasant 85 yo man with a h/o CHB as well as high grade AV block, LV dysfunction, remote STEMI, EF 30%. He has class 2 symptoms, with mostly low output in nature. He moves very slow. He has LBBB at baseline. He has not had frank syncope although he does get lightheaded.  No Known Allergies         Current Outpatient Medications  Medication Sig Dispense Refill  . aspirin 81 MG tablet Take 81 mg by mouth daily.      . cholecalciferol (VITAMIN D) 1000 UNITS tablet Take 1,000 Units by mouth daily. 2 tablets    . furosemide (LASIX) 20 MG tablet Take 1 tablet (20 mg total) by mouth daily. 30 tablet 11  . lisinopril (PRINIVIL,ZESTRIL) 10 MG tablet TAKE 1 TABLET DAILY 30 tablet 3  . simvastatin (ZOCOR) 20 MG tablet Take 2 tablets (40 mg total) by mouth at bedtime. (Patient taking differently: Take 40 mg by mouth at bedtime. Pt states he takes this seldomly) 180 tablet 3   No current facility-administered medications for this visit.          Past Medical History:  Diagnosis Date  . Coronary artery disease   . Hyperlipidemia     ROS:   All systems reviewed and negative except as noted in the HPI.        Past Surgical History:  Procedure Laterality Date  . CARDIAC CATHETERIZATION    . HEMORRHOID SURGERY     removed a hemorrhoid in the 1970s  . KIDNEY STONE SURGERY     remove a kidney stone          Family History  Problem Relation Age of Onset  . Heart attack Father        in his 70's  . Cancer Brother        1/2  . Cancer Sister   . Heart Problems Brother 80       pacemaker 2/2     Social History        Socioeconomic History  . Marital status: Married    Spouse name: Not on file  . Number of children: Not on file  . Years of education: Not on file  . Highest education level: Not on file  Occupational History  . Not on  file  Social Needs  . Financial resource strain: Not on file  . Food insecurity:    Worry: Not on file    Inability: Not on file  . Transportation needs:    Medical: Not on file    Non-medical: Not on file  Tobacco Use  . Smoking status: Former Research scientist (life sciences)  . Smokeless tobacco: Never Used  Substance and Sexual Activity  . Alcohol use: No  . Drug use: No  . Sexual activity: Not on file  Lifestyle  . Physical activity:    Days per week: Not on file    Minutes per session: Not on file  . Stress: Not on file  Relationships  . Social connections:    Talks on phone: Not on file    Gets together: Not on file    Attends religious service: Not on file    Active member of club or organization: Not on file    Attends meetings of clubs or organizations: Not on file    Relationship status: Not on file  . Intimate partner  violence:    Fear of current or ex partner: Not on file    Emotionally abused: Not on file    Physically abused: Not on file    Forced sexual activity: Not on file  Other Topics Concern  . Not on file  Social History Narrative   He is married, has 1-son. He does not smoke, or use alcohol     BP (!) 124/56   Pulse (!) 42   Ht 5\' 7"  (1.702 m)   Wt 160 lb (72.6 kg)   SpO2 99%   BMI 25.06 kg/m   Physical Exam:  Well appearing NAD HEENT: Unremarkable Neck:  No JVD, no thyromegally Lymphatics:  No adenopathy Back:  No CVA tenderness Lungs:  Clear with no wheezes HEART:  IRegular brady rhythm, no murmurs, no rubs, no clicks Abd:  soft, positive bowel sounds, no organomegally, no rebound, no guarding Ext:  2 plus pulses, no edema, no cyanosis, no clubbing Skin:  No rashes no nodules Neuro:  CN II through XII intact, motor grossly intact  EKG - reviewe - NSR with AVWB and LBBB  Zio patch - reviewed - he has CHB and 2:1 and AVWB all during the daytime  Assess/Plan: 1. High grade AV block - I have discussed the treatment  options with the patient and recommended proceeding with PPM insertion. With his LV dysfunction, preserved mentation, and certainty of pacing 182% of the time, we will plan to place a LV lead.  2. Chronic systolic heart failure - his symptoms are limited by his low output primarily.   Ponciano Ort.           EP attending  Patient seen and examined.  Since my prior clinic note, the patient has developed alternating bundle branch block.  As noted before, his ejection fraction is 30%.  He will pacing 100% of the time.  He will undergo biventricular pacemaker insertion.  The risks, goals, benefits, and expectations of the procedure were reviewed and he wishes to proceed.  Cristopher Peru, MD

## 2018-12-23 ENCOUNTER — Other Ambulatory Visit: Payer: Self-pay

## 2018-12-23 ENCOUNTER — Ambulatory Visit (HOSPITAL_COMMUNITY): Payer: PPO

## 2018-12-23 DIAGNOSIS — I447 Left bundle-branch block, unspecified: Secondary | ICD-10-CM | POA: Diagnosis not present

## 2018-12-23 DIAGNOSIS — Z7982 Long term (current) use of aspirin: Secondary | ICD-10-CM | POA: Diagnosis not present

## 2018-12-23 DIAGNOSIS — Z79899 Other long term (current) drug therapy: Secondary | ICD-10-CM | POA: Diagnosis not present

## 2018-12-23 DIAGNOSIS — I5022 Chronic systolic (congestive) heart failure: Secondary | ICD-10-CM | POA: Diagnosis not present

## 2018-12-23 DIAGNOSIS — I442 Atrioventricular block, complete: Secondary | ICD-10-CM | POA: Diagnosis not present

## 2018-12-23 DIAGNOSIS — I252 Old myocardial infarction: Secondary | ICD-10-CM | POA: Diagnosis not present

## 2018-12-23 DIAGNOSIS — Z950 Presence of cardiac pacemaker: Secondary | ICD-10-CM | POA: Diagnosis not present

## 2018-12-23 DIAGNOSIS — I255 Ischemic cardiomyopathy: Secondary | ICD-10-CM | POA: Diagnosis not present

## 2018-12-23 DIAGNOSIS — E785 Hyperlipidemia, unspecified: Secondary | ICD-10-CM | POA: Diagnosis not present

## 2018-12-23 DIAGNOSIS — I2510 Atherosclerotic heart disease of native coronary artery without angina pectoris: Secondary | ICD-10-CM | POA: Diagnosis not present

## 2018-12-23 DIAGNOSIS — Z87891 Personal history of nicotine dependence: Secondary | ICD-10-CM | POA: Diagnosis not present

## 2018-12-23 DIAGNOSIS — N189 Chronic kidney disease, unspecified: Secondary | ICD-10-CM | POA: Diagnosis not present

## 2018-12-23 DIAGNOSIS — K449 Diaphragmatic hernia without obstruction or gangrene: Secondary | ICD-10-CM | POA: Diagnosis not present

## 2018-12-23 DIAGNOSIS — I130 Hypertensive heart and chronic kidney disease with heart failure and stage 1 through stage 4 chronic kidney disease, or unspecified chronic kidney disease: Secondary | ICD-10-CM | POA: Diagnosis not present

## 2018-12-23 MED ORDER — CARVEDILOL 3.125 MG PO TABS: 3.1250 mg | ORAL_TABLET | Freq: Two times a day (BID) | ORAL | 6 refills | Status: DC

## 2018-12-23 NOTE — Progress Notes (Signed)
Patient ready for discharge. 

## 2018-12-23 NOTE — Plan of Care (Signed)

## 2018-12-23 NOTE — Discharge Instructions (Signed)
° ° °  Supplemental Discharge Instructions for  Pacemaker/Defibrillator Patients  Activity No heavy lifting or vigorous activity with your left arm for 6 to 8 weeks.  Do not raise your left arm above your head for one week.  Gradually raise your affected arm as drawn below.              12/26/2018                12/27/2018                 12/28/2018               12/29/2018 __  NO DRIVING for  1 week   ; you may begin driving on  3/64/6803   .  WOUND CARE - Keep the wound area clean and dry.  Do not get this area wet for one week. No showers for one week; you may shower on 12/29/2018  . - The tape/steri-strips on your wound will fall off; do not pull them off.  No bandage is needed on the site.  DO  NOT apply any creams, oils, or ointments to the wound area. - If you notice any drainage or discharge from the wound, any swelling or bruising at the site, or you develop a fever > 101? F after you are discharged home, call the office at once.  Special Instructions - You are still able to use cellular telephones; use the ear opposite the side where you have your pacemaker/defibrillator.  Avoid carrying your cellular phone near your device. - When traveling through airports, show security personnel your identification card to avoid being screened in the metal detectors.  Ask the security personnel to use the hand wand. - Avoid arc welding equipment, MRI testing (magnetic resonance imaging), TENS units (transcutaneous nerve stimulators).  Call the office for questions about other devices. - Avoid electrical appliances that are in poor condition or are not properly grounded. - Microwave ovens are safe to be near or to operate.

## 2018-12-23 NOTE — Plan of Care (Signed)
  Problem: Education: Goal: Knowledge of General Education information will improve Description: Including pain rating scale, medication(s)/side effects and non-pharmacologic comfort measures Outcome: Adequate for Discharge   

## 2019-01-05 ENCOUNTER — Encounter (INDEPENDENT_AMBULATORY_CARE_PROVIDER_SITE_OTHER): Payer: Self-pay

## 2019-01-05 ENCOUNTER — Ambulatory Visit (INDEPENDENT_AMBULATORY_CARE_PROVIDER_SITE_OTHER): Payer: PPO | Admitting: Nurse Practitioner

## 2019-01-05 DIAGNOSIS — I255 Ischemic cardiomyopathy: Secondary | ICD-10-CM

## 2019-01-05 DIAGNOSIS — I459 Conduction disorder, unspecified: Secondary | ICD-10-CM

## 2019-01-05 LAB — CUP PACEART INCLINIC DEVICE CHECK
Date Time Interrogation Session: 20200302132110
Implantable Lead Implant Date: 20200217
Implantable Lead Implant Date: 20200217
Implantable Lead Location: 753858
Implantable Lead Location: 753859
Implantable Lead Location: 753860
Implantable Lead Model: 4398
Implantable Lead Model: 5076
Implantable Lead Model: 5076
Implantable Pulse Generator Implant Date: 20200217
MDC IDC LEAD IMPLANT DT: 20200217

## 2019-01-05 NOTE — Progress Notes (Signed)
Wound check appointment. Steri-strips removed. Wound without redness or edema. Incision edges approximated, wound well healed. Normal device function. Thresholds, sensing, and impedances consistent with implant measurements. Device programmed at 3.5V/auto capture programmed on for extra safety margin until 3 month visit. Histograms flat. Rate response turned on today. No mode switches noted. 1 NSVT episode. Patient educated about wound care, arm mobility, lifting restrictions. ROV in 3 months with implanting physician.

## 2019-01-09 ENCOUNTER — Other Ambulatory Visit (HOSPITAL_COMMUNITY): Payer: Self-pay | Admitting: Cardiology

## 2019-02-07 ENCOUNTER — Other Ambulatory Visit: Payer: Self-pay | Admitting: Internal Medicine

## 2019-02-17 DIAGNOSIS — E7849 Other hyperlipidemia: Secondary | ICD-10-CM | POA: Diagnosis not present

## 2019-02-17 DIAGNOSIS — N183 Chronic kidney disease, stage 3 (moderate): Secondary | ICD-10-CM | POA: Diagnosis not present

## 2019-02-17 DIAGNOSIS — Z950 Presence of cardiac pacemaker: Secondary | ICD-10-CM | POA: Diagnosis not present

## 2019-02-17 DIAGNOSIS — R7301 Impaired fasting glucose: Secondary | ICD-10-CM | POA: Diagnosis not present

## 2019-02-17 DIAGNOSIS — I255 Ischemic cardiomyopathy: Secondary | ICD-10-CM | POA: Diagnosis not present

## 2019-02-17 DIAGNOSIS — N200 Calculus of kidney: Secondary | ICD-10-CM | POA: Diagnosis not present

## 2019-03-16 ENCOUNTER — Other Ambulatory Visit (HOSPITAL_COMMUNITY): Payer: Self-pay | Admitting: Cardiology

## 2019-03-19 ENCOUNTER — Telehealth: Payer: Self-pay | Admitting: Internal Medicine

## 2019-03-19 NOTE — Telephone Encounter (Signed)
New Message            Patient's son (Darrel) is calling to verify which type of appointment the patient will have. Virtual, phone or office?  He is concerned about bring his father out Pls call to advise.

## 2019-03-25 NOTE — Telephone Encounter (Signed)
Please disregard prior amendment to this note. I brought it up in error.

## 2019-03-31 ENCOUNTER — Ambulatory Visit (INDEPENDENT_AMBULATORY_CARE_PROVIDER_SITE_OTHER): Payer: PPO | Admitting: *Deleted

## 2019-03-31 ENCOUNTER — Encounter: Payer: PPO | Admitting: Internal Medicine

## 2019-03-31 DIAGNOSIS — I255 Ischemic cardiomyopathy: Secondary | ICD-10-CM

## 2019-03-31 DIAGNOSIS — I459 Conduction disorder, unspecified: Secondary | ICD-10-CM

## 2019-03-31 LAB — CUP PACEART REMOTE DEVICE CHECK
Battery Remaining Longevity: 91 mo
Battery Voltage: 3.03 V
Brady Statistic AP VP Percent: 0 %
Brady Statistic AP VS Percent: 0 %
Brady Statistic AS VP Percent: 0 %
Brady Statistic AS VS Percent: 0 %
Brady Statistic RA Percent Paced: 0 %
Brady Statistic RV Percent Paced: 99.07 %
Date Time Interrogation Session: 20200526000250
Implantable Lead Implant Date: 20200217
Implantable Lead Implant Date: 20200217
Implantable Lead Implant Date: 20200217
Implantable Lead Location: 753858
Implantable Lead Location: 753859
Implantable Lead Location: 753860
Implantable Lead Model: 4398
Implantable Lead Model: 5076
Implantable Lead Model: 5076
Implantable Pulse Generator Implant Date: 20200217
Lead Channel Impedance Value: 247 Ohm
Lead Channel Impedance Value: 304 Ohm
Lead Channel Impedance Value: 342 Ohm
Lead Channel Impedance Value: 342 Ohm
Lead Channel Impedance Value: 361 Ohm
Lead Channel Impedance Value: 361 Ohm
Lead Channel Impedance Value: 399 Ohm
Lead Channel Impedance Value: 456 Ohm
Lead Channel Impedance Value: 475 Ohm
Lead Channel Impedance Value: 475 Ohm
Lead Channel Impedance Value: 494 Ohm
Lead Channel Impedance Value: 570 Ohm
Lead Channel Impedance Value: 570 Ohm
Lead Channel Impedance Value: 589 Ohm
Lead Channel Pacing Threshold Amplitude: 0.5 V
Lead Channel Pacing Threshold Amplitude: 0.75 V
Lead Channel Pacing Threshold Amplitude: 1.125 V
Lead Channel Pacing Threshold Pulse Width: 0.4 ms
Lead Channel Pacing Threshold Pulse Width: 0.4 ms
Lead Channel Pacing Threshold Pulse Width: 1 ms
Lead Channel Sensing Intrinsic Amplitude: 1.375 mV
Lead Channel Sensing Intrinsic Amplitude: 1.375 mV
Lead Channel Sensing Intrinsic Amplitude: 20.375 mV
Lead Channel Sensing Intrinsic Amplitude: 20.375 mV
Lead Channel Setting Pacing Amplitude: 1.75 V
Lead Channel Setting Pacing Amplitude: 3 V
Lead Channel Setting Pacing Amplitude: 3.5 V
Lead Channel Setting Pacing Pulse Width: 0.4 ms
Lead Channel Setting Pacing Pulse Width: 1 ms
Lead Channel Setting Sensing Sensitivity: 1.2 mV

## 2019-04-03 ENCOUNTER — Encounter: Payer: PPO | Admitting: Nurse Practitioner

## 2019-04-06 ENCOUNTER — Encounter: Payer: PPO | Admitting: Nurse Practitioner

## 2019-04-13 NOTE — Progress Notes (Signed)
Remote pacemaker transmission.   

## 2019-04-14 ENCOUNTER — Telehealth: Payer: Self-pay

## 2019-04-14 NOTE — Telephone Encounter (Signed)
Left message regarding appt on 04/15/19. 

## 2019-04-15 ENCOUNTER — Other Ambulatory Visit: Payer: Self-pay

## 2019-04-15 ENCOUNTER — Encounter: Payer: Self-pay | Admitting: Nurse Practitioner

## 2019-04-15 ENCOUNTER — Ambulatory Visit (INDEPENDENT_AMBULATORY_CARE_PROVIDER_SITE_OTHER): Payer: PPO | Admitting: Student

## 2019-04-15 VITALS — BP 116/66 | HR 82 | Ht 66.0 in | Wt 158.0 lb

## 2019-04-15 DIAGNOSIS — I459 Conduction disorder, unspecified: Secondary | ICD-10-CM

## 2019-04-15 DIAGNOSIS — I509 Heart failure, unspecified: Secondary | ICD-10-CM | POA: Diagnosis not present

## 2019-04-15 DIAGNOSIS — I255 Ischemic cardiomyopathy: Secondary | ICD-10-CM

## 2019-04-15 DIAGNOSIS — I440 Atrioventricular block, first degree: Secondary | ICD-10-CM | POA: Diagnosis not present

## 2019-04-15 DIAGNOSIS — I4819 Other persistent atrial fibrillation: Secondary | ICD-10-CM | POA: Diagnosis not present

## 2019-04-15 LAB — CUP PACEART INCLINIC DEVICE CHECK
Battery Remaining Longevity: 98 mo
Battery Voltage: 3.03 V
Brady Statistic AP VP Percent: 91.22 %
Brady Statistic AP VS Percent: 0.07 %
Brady Statistic AS VP Percent: 8.5 %
Brady Statistic AS VS Percent: 0.21 %
Brady Statistic RA Percent Paced: 12.96 %
Brady Statistic RV Percent Paced: 98.92 %
Date Time Interrogation Session: 20200610112313
Implantable Lead Implant Date: 20200217
Implantable Lead Implant Date: 20200217
Implantable Lead Implant Date: 20200217
Implantable Lead Location: 753858
Implantable Lead Location: 753859
Implantable Lead Location: 753860
Implantable Lead Model: 4398
Implantable Lead Model: 5076
Implantable Lead Model: 5076
Implantable Pulse Generator Implant Date: 20200217
Lead Channel Impedance Value: 304 Ohm
Lead Channel Impedance Value: 361 Ohm
Lead Channel Impedance Value: 380 Ohm
Lead Channel Impedance Value: 380 Ohm
Lead Channel Impedance Value: 456 Ohm
Lead Channel Impedance Value: 456 Ohm
Lead Channel Impedance Value: 475 Ohm
Lead Channel Impedance Value: 475 Ohm
Lead Channel Impedance Value: 551 Ohm
Lead Channel Impedance Value: 551 Ohm
Lead Channel Impedance Value: 589 Ohm
Lead Channel Pacing Threshold Amplitude: 0.5 V
Lead Channel Pacing Threshold Amplitude: 0.875 V
Lead Channel Pacing Threshold Amplitude: 1.25 V
Lead Channel Pacing Threshold Pulse Width: 0.4 ms
Lead Channel Pacing Threshold Pulse Width: 0.4 ms
Lead Channel Pacing Threshold Pulse Width: 1 ms
Lead Channel Sensing Intrinsic Amplitude: 1.25 mV
Lead Channel Sensing Intrinsic Amplitude: 1.75 mV
Lead Channel Sensing Intrinsic Amplitude: 19.625 mV
Lead Channel Sensing Intrinsic Amplitude: 19.625 mV
Lead Channel Setting Pacing Amplitude: 1.75 V
Lead Channel Setting Pacing Amplitude: 2.5 V
Lead Channel Setting Pacing Amplitude: 3.5 V
Lead Channel Setting Pacing Pulse Width: 0.4 ms
Lead Channel Setting Pacing Pulse Width: 1 ms
Lead Channel Setting Sensing Sensitivity: 1.2 mV

## 2019-04-15 LAB — BASIC METABOLIC PANEL
BUN/Creatinine Ratio: 13 (ref 10–24)
BUN: 17 mg/dL (ref 10–36)
CO2: 24 mmol/L (ref 20–29)
Calcium: 9.7 mg/dL (ref 8.6–10.2)
Chloride: 103 mmol/L (ref 96–106)
Creatinine, Ser: 1.26 mg/dL (ref 0.76–1.27)
GFR calc Af Amer: 56 mL/min/{1.73_m2} — ABNORMAL LOW (ref >59)
GFR calc non Af Amer: 48 mL/min/{1.73_m2} — ABNORMAL LOW (ref >59)
Glucose: 86 mg/dL (ref 65–99)
Potassium: 4.2 mmol/L (ref 3.5–5.2)
Sodium: 141 mmol/L (ref 134–144)

## 2019-04-15 LAB — CBC
Hematocrit: 37.5 % (ref 37.5–51.0)
Hemoglobin: 12.3 g/dL — ABNORMAL LOW (ref 13.0–17.7)
MCH: 29.9 pg (ref 26.6–33.0)
MCHC: 32.8 g/dL (ref 31.5–35.7)
MCV: 91 fL (ref 79–97)
Platelets: 182 10*3/uL (ref 150–450)
RBC: 4.12 x10E6/uL — ABNORMAL LOW (ref 4.14–5.80)
RDW: 12.9 % (ref 11.6–15.4)
WBC: 6.7 10*3/uL (ref 3.4–10.8)

## 2019-04-15 MED ORDER — APIXABAN 5 MG PO TABS: 5.0000 mg | ORAL_TABLET | Freq: Two times a day (BID) | ORAL | 3 refills | Status: DC

## 2019-04-15 NOTE — Progress Notes (Signed)
PCP:  Reynold Bowen, MD Primary Cardiologist: [name], MD Electrophysiologist: Cristopher Peru, MD   Can Lucci is a 85 y.o. male who presents today for routine electrophysiology followup. They are seen for Dr Lovena Le.   Since last being seen in our clinic, the patient reports doing very well.  He is feeling better since having his PPM put in. He is able to do all of his ADLs and yard work without difficult. Occasional SOB with moderate exertion. Has occasional mild, peripheral edema. Denies orthopnea or PND. No dizziness, lightheadedness, syncope, or presyncope. No CP. He bruises easily on ASA. Pt has occasional palpitations, but feels like heart is "skipping". No tachy-palpitations.  The patient feels that he is tolerating medications without difficulties and is otherwise without complaint today.   Past Medical History:  Diagnosis Date  . Coronary artery disease   . Hyperlipidemia    Past Surgical History:  Procedure Laterality Date  . BIV PACEMAKER INSERTION CRT-P N/A 12/22/2018   Procedure: BIV PACEMAKER INSERTION CRT-P;  Surgeon: Evans Lance, MD;  Location: [facility];  Service: Cardiovascular;  Laterality: N/A;  . CARDIAC CATHETERIZATION    . HEMORRHOID SURGERY     removed a hemorrhoid in the 1970s  . KIDNEY STONE SURGERY     remove a kidney stone    Current Outpatient Medications  Medication Sig Dispense Refill  . apixaban (ELIQUIS) 5 MG TABS tablet Take 1 tablet (5 mg total) by mouth 2 (two) times daily. 180 tablet 3  . carvedilol (COREG) 3.125 MG tablet Take 1 tablet (3.125 mg total) by mouth 2 (two) times daily. 60 tablet 6  . cholecalciferol (VITAMIN D) 1000 UNITS tablet Take 1,000 Units by mouth daily. 2 tablets    . furosemide (LASIX) 20 MG tablet Take 1 tablet (20 mg total) by mouth daily. 90 tablet 0  . lisinopril (PRINIVIL,ZESTRIL) 10 MG tablet TAKE 1 TABLET DAILY 30 tablet 5  . simvastatin (ZOCOR) 20 MG tablet Take 2 tablets (40 mg total) by mouth  at bedtime. 180 tablet 0   No current facility-administered medications for this visit.     No Known Allergies  Social History   Socioeconomic History  . Marital status: Married    Spouse name: Not on file  . Number of children: Not on file  . Years of education: Not on file  . Highest education level: Not on file  Occupational History  . Not on file  Social Needs  . Financial resource strain: Not on file  . Food insecurity:    Worry: Not on file    Inability: Not on file  . Transportation needs:    Medical: Not on file    Non-medical: Not on file  Tobacco Use  . Smoking status: Former Research scientist (life sciences)  . Smokeless tobacco: Never Used  Substance and Sexual Activity  . Alcohol use: No  . Drug use: No  . Sexual activity: Not on file  Lifestyle  . Physical activity:    Days per week: Not on file    Minutes per session: Not on file  . Stress: Not on file  Relationships  . Social connections:    Talks on phone: Not on file    Gets together: Not on file    Attends religious service: Not on file    Active member of club or organization: Not on file    Attends meetings of clubs or organizations: Not on file    Relationship status: Not on file  .  Intimate partner violence:    Fear of current or ex partner: Not on file    Emotionally abused: Not on file    Physically abused: Not on file    Forced sexual activity: Not on file  Other Topics Concern  . Not on file  Social History Narrative   He is married, has 1-son. He does not smoke, or use alcohol    Review of systems complete and found to be negative unless listed in HPI.    Physical Exam: Vitals:   04/15/19 1040  BP: 116/66  Pulse: 82  SpO2: 98%  Weight: 158 lb (71.7 kg)  Height: 5\' 6"  (1.676 m)    GEN- The patient is elderly and well appearing, alert and oriented x 3 today.   Head- normocephalic, atraumatic Eyes-  Sclera clear, conjunctiva pink Ears- hearing intact Oropharynx- clear Neck- supple,  Lungs- Clear  to ausculation bilaterally, normal work of breathing Chest- pacemaker pocket is without hematoma/ bruit Heart- Regular rate and rhythm, no murmurs, rubs or gallops, PMI not laterally displaced GI- soft, NT, ND, + BS Extremities- no clubbing or cyanosis. Trace ankle edema Neuro- strength and sensation are intact  Pacemaker interrogation- reviewed in detail today,  See paper chart   Assessment and Plan:  1. High grade AV block - s/p BiV-PPM placement. Device stable today. BiV pacing 100% of the time.  2. Chronic systolic CHF - Volume status stable on exam. Low output symptoms have seemed to improve with BiV pacing. Will schedule repeat Echo now with BiV-P.  3. Persistent Atrial fibrillation with controlled venrticular rates - New. Asymptomatic. Will start on Eliquis 5 mg BID. Stop ASA. This patients CHA2DS2-VASc is at least 5 (CHF, HTN, Vascular, Age > 75 x 2).   Shirley Friar, PA-C  04/15/19 11:02 AM  Greater than 50% of the 25 minute visit was spent in counseling/coordination of care regarding disease state education, medication education, and medication compliance.

## 2019-04-15 NOTE — Patient Instructions (Signed)
Medication Instructions:  STOP Aspirin START Eliquis 5 mg TWICE per DAY If you need a refill on your cardiac medications before your next appointment, please call your pharmacy.   Lab work:TODAY CBC BMET If you have labs (blood work) drawn today and your tests are completely normal, you will receive your results only by: Marland Kitchen MyChart Message (if you have MyChart) OR . A paper copy in the mail If you have any lab test that is abnormal or we need to change your treatment, we will call you to review the results.  Testing/Procedures: 3 months (Please schedule same day as OV with Dr Lovena Le or Chanetta Marshall Your physician has requested that you have an echocardiogram. Echocardiography is a painless test that uses sound waves to create images of your heart. It provides your doctor with information about the size and shape of your heart and how well your heart's chambers and valves are working. This procedure takes approximately one hour. There are no restrictions for this procedure.    Follow-Up: 3 MONTHS with Dr Lovena Le  Or Chanetta Marshall At [facility], you and your health needs are our priority.  As part of our continuing mission to provide you with exceptional heart care, we have created designated Provider Care Teams.  These Care Teams include your primary Cardiologist (physician) and Advanced Practice Providers (APPs -  Physician Assistants and Nurse Practitioners) who all work together to provide you with the care you need, when you need it. .   Any Other Special Instructions Will Be Listed Below (If Applicable).

## 2019-04-16 ENCOUNTER — Telehealth: Payer: Self-pay

## 2019-04-16 ENCOUNTER — Other Ambulatory Visit: Payer: Self-pay

## 2019-04-16 DIAGNOSIS — Z79899 Other long term (current) drug therapy: Secondary | ICD-10-CM

## 2019-04-16 NOTE — Telephone Encounter (Signed)
-----   Message from Patsey Berthold, NP sent at 04/16/2019  8:28 AM EDT ----- Please notify patient of stable labs. With mildly low hemoglobin at baseline, would repeat CBC and BMET 2 weeks after starting Eliquis. Ok to have done with PCP if coming to [place] is difficult.  Thanks!

## 2019-04-16 NOTE — Telephone Encounter (Signed)
Notes recorded by Frederik Schmidt, RN on 04/16/2019 at 9:33 AM EDT  The patient has been notified of the result and verbalized understanding. All questions (if any) were answered. Labs (CBC/BMET) to be drawn 6/25.  Frederik Schmidt, RN 04/16/2019 9:32 AM

## 2019-04-16 NOTE — Telephone Encounter (Signed)
-----   Message from Patsey Berthold, NP sent at 04/16/2019  8:28 AM EDT ----- Please notify patient of stable labs. With mildly low hemoglobin at baseline, would repeat CBC and BMET 2 weeks after starting Eliquis. Ok to have done with PCP if coming to [place] is difficult.  Thanks!

## 2019-04-16 NOTE — Telephone Encounter (Signed)
Notes recorded by Frederik Schmidt, RN on 04/16/2019 at 9:26 AM EDT  I tried reaching patient on cell phone and home, but no answer and no VM. I spoke to son Darryl and it I am not able to reach patient, he will assist.  ------

## 2019-04-19 IMAGING — DX DG CHEST 2V
2 series · 2 of 2 positions shown · non-contrast
Comparison: 02/14/2014 chest radiograph.

CLINICAL DATA: Cardiac pacemaker placement

EXAM:
CHEST - 2 VIEW

[w chest pa]
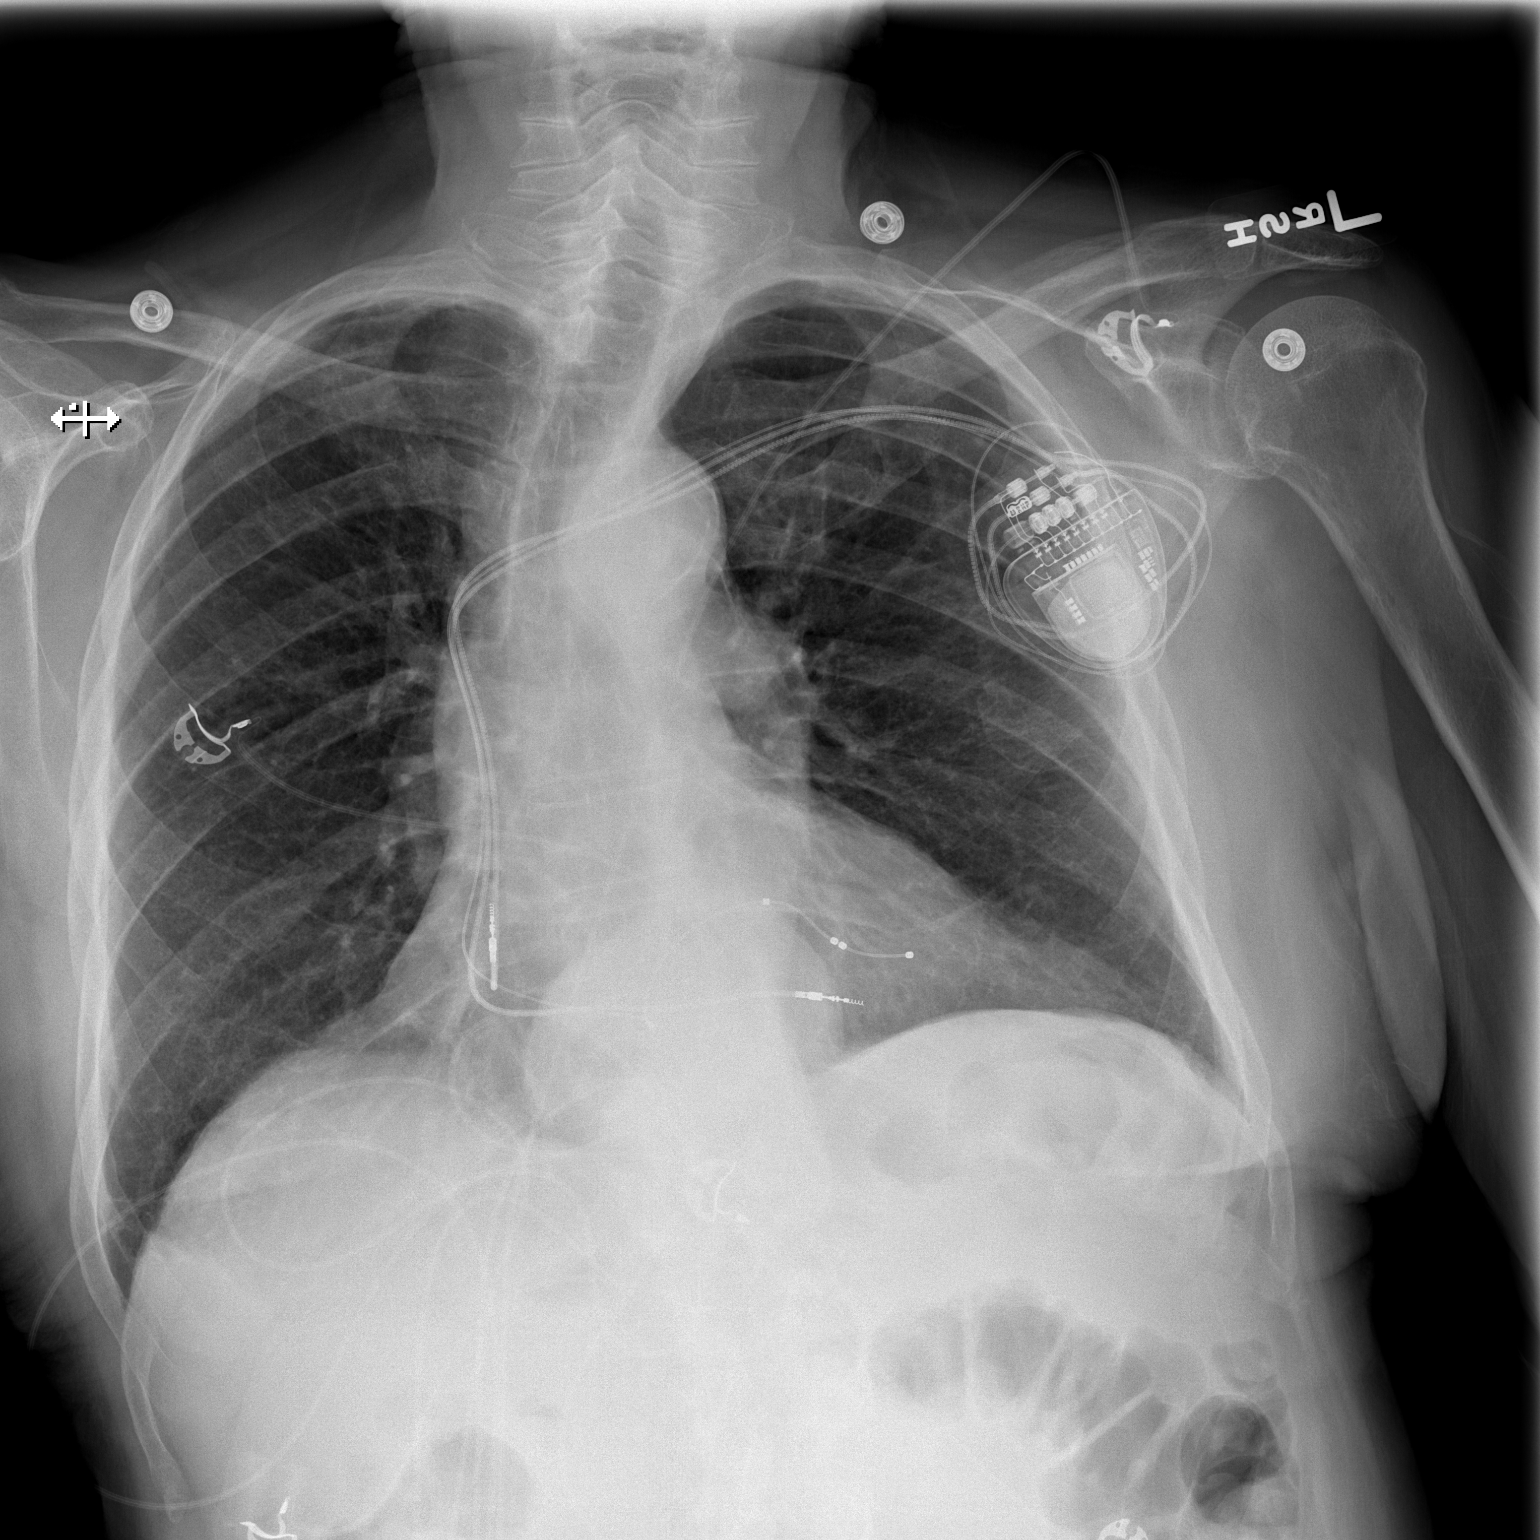

[w chest lat]
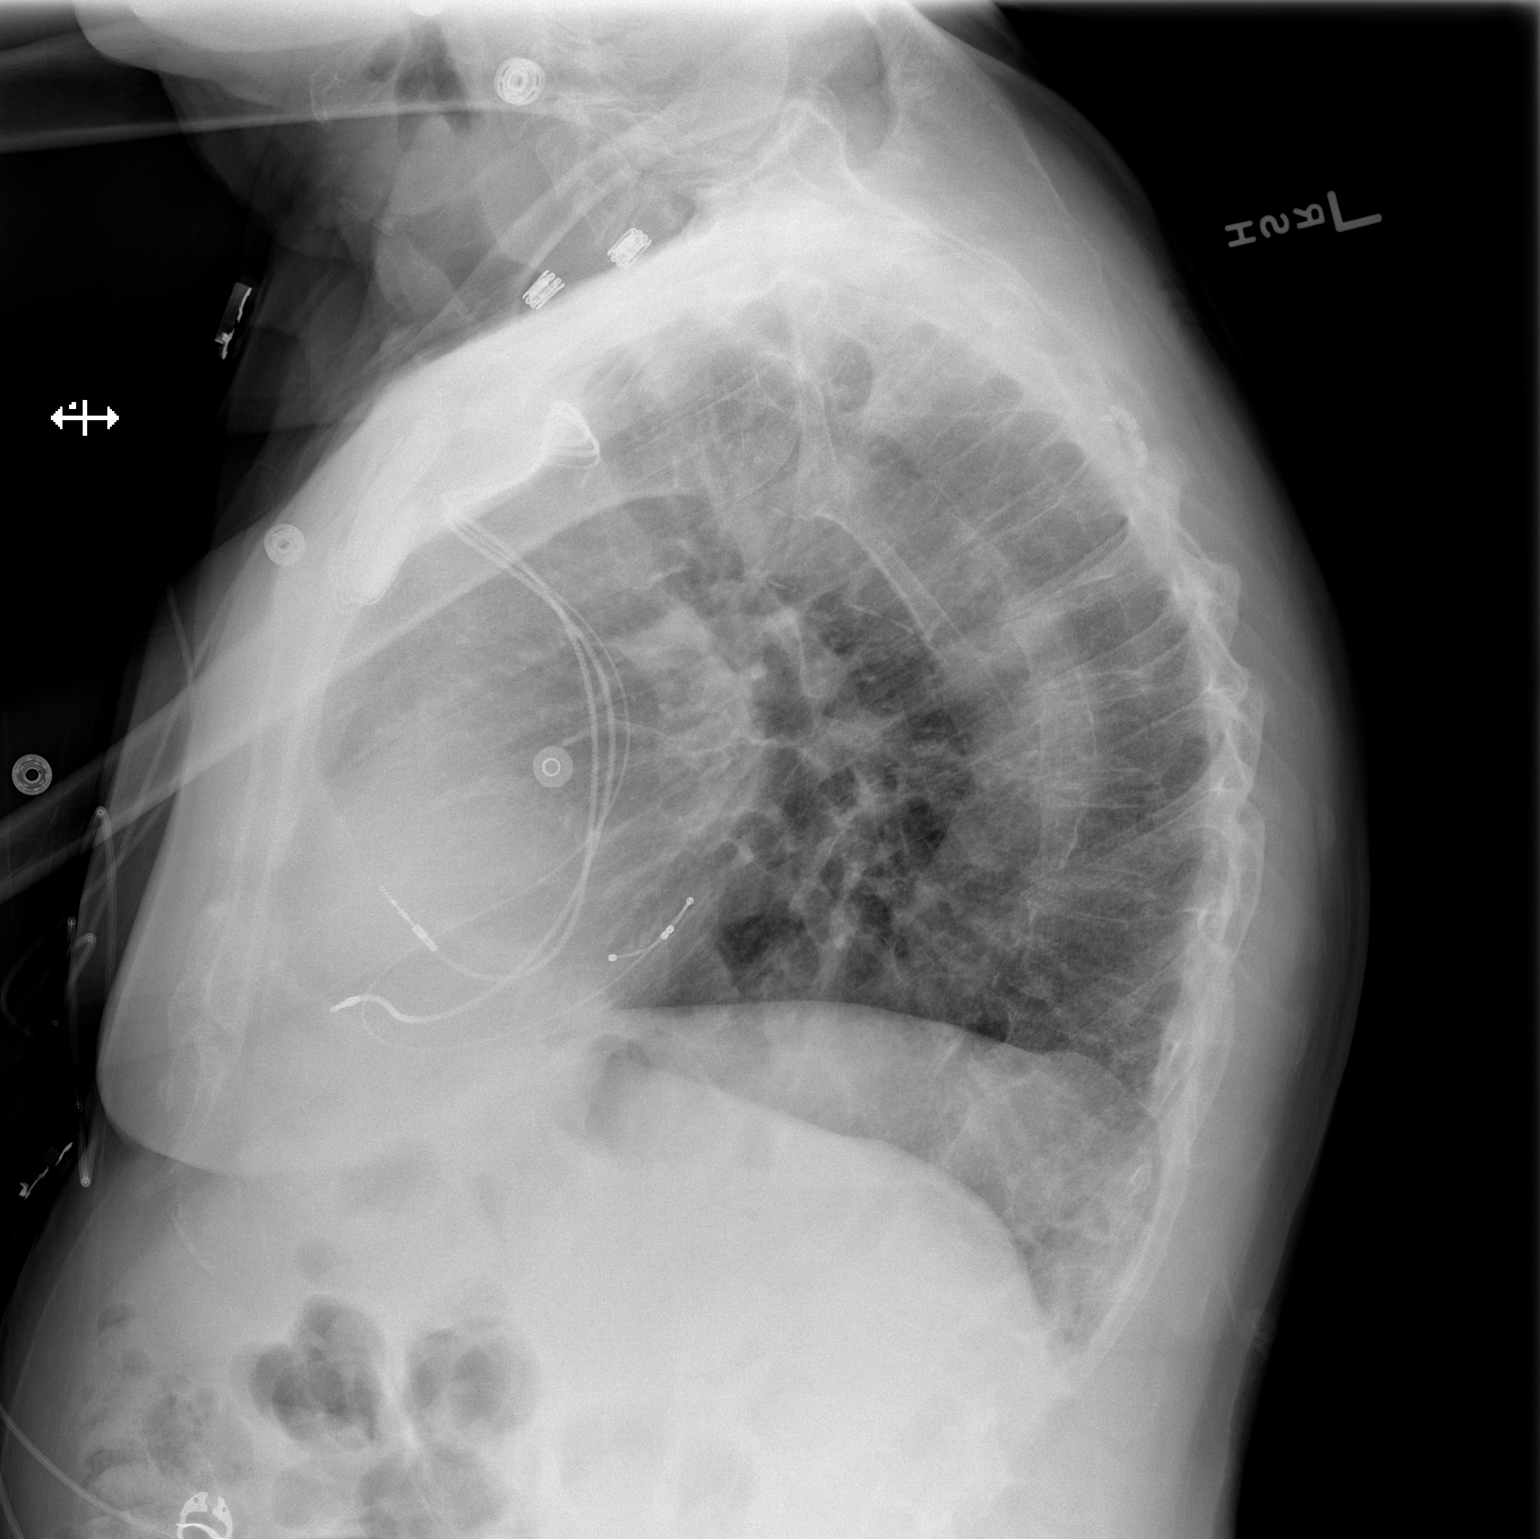

[2 of 2 positions shown; findings below may reference images not displayed]

FINDINGS: Three lead left subclavian pacemaker is noted with lead tips
overlying the right atrium, right ventricle and coronary sinus.
Stable cardiomediastinal silhouette with mild cardiomegaly and
moderate hiatal hernia. No pneumothorax. No pleural effusion. Lungs
appear clear, with no acute consolidative airspace disease and no
pulmonary edema.
IMPRESSION: No pneumothorax. Well-positioned 3 lead left subclavian pacemaker.
Mild cardiomegaly without pulmonary edema. No active pulmonary
disease.

## 2019-04-20 ENCOUNTER — Other Ambulatory Visit (HOSPITAL_COMMUNITY): Payer: Self-pay | Admitting: Cardiology

## 2019-04-28 ENCOUNTER — Other Ambulatory Visit (HOSPITAL_COMMUNITY): Payer: Self-pay | Admitting: Cardiology

## 2019-04-30 ENCOUNTER — Other Ambulatory Visit: Payer: Self-pay

## 2019-04-30 ENCOUNTER — Other Ambulatory Visit: Payer: PPO | Admitting: *Deleted

## 2019-04-30 DIAGNOSIS — Z79899 Other long term (current) drug therapy: Secondary | ICD-10-CM | POA: Diagnosis not present

## 2019-05-01 ENCOUNTER — Telehealth: Payer: Self-pay

## 2019-05-01 LAB — BASIC METABOLIC PANEL
BUN/Creatinine Ratio: 13 (ref 10–24)
BUN: 17 mg/dL (ref 10–36)
CO2: 22 mmol/L (ref 20–29)
Calcium: 9.2 mg/dL (ref 8.6–10.2)
Chloride: 104 mmol/L (ref 96–106)
Creatinine, Ser: 1.27 mg/dL (ref 0.76–1.27)
GFR calc Af Amer: 55 mL/min/{1.73_m2} — ABNORMAL LOW (ref >59)
GFR calc non Af Amer: 48 mL/min/{1.73_m2} — ABNORMAL LOW (ref >59)
Glucose: 113 mg/dL — ABNORMAL HIGH (ref 65–99)
Potassium: 4.4 mmol/L (ref 3.5–5.2)
Sodium: 141 mmol/L (ref 134–144)

## 2019-05-01 LAB — CBC
Hematocrit: 35.5 % — ABNORMAL LOW (ref 37.5–51.0)
Hemoglobin: 11.7 g/dL — ABNORMAL LOW (ref 13.0–17.7)
MCH: 30.9 pg (ref 26.6–33.0)
MCHC: 33 g/dL (ref 31.5–35.7)
MCV: 94 fL (ref 79–97)
Platelets: 157 10*3/uL (ref 150–450)
RBC: 3.79 x10E6/uL — ABNORMAL LOW (ref 4.14–5.80)
RDW: 12.7 % (ref 11.6–15.4)
WBC: 6.1 10*3/uL (ref 3.4–10.8)

## 2019-05-01 NOTE — Telephone Encounter (Signed)
-----   Message from Patsey Berthold, NP sent at 05/01/2019  9:24 AM EDT ----- Please notify patient of stable labs.

## 2019-05-01 NOTE — Telephone Encounter (Signed)
Notes recorded by Frederik Schmidt, RN on 05/01/2019 at 9:47 AM EDT  The patient's son has been notified of the result and verbalized understanding. All questions (if any) were answered.  Frederik Schmidt, RN 05/01/2019 9:47 AM

## 2019-05-19 ENCOUNTER — Other Ambulatory Visit (HOSPITAL_COMMUNITY): Payer: Self-pay | Admitting: Cardiology

## 2019-06-22 ENCOUNTER — Other Ambulatory Visit (HOSPITAL_COMMUNITY): Payer: Self-pay | Admitting: Cardiology

## 2019-06-24 DIAGNOSIS — Z961 Presence of intraocular lens: Secondary | ICD-10-CM | POA: Diagnosis not present

## 2019-06-24 DIAGNOSIS — H43813 Vitreous degeneration, bilateral: Secondary | ICD-10-CM | POA: Diagnosis not present

## 2019-06-24 DIAGNOSIS — H353121 Nonexudative age-related macular degeneration, left eye, early dry stage: Secondary | ICD-10-CM | POA: Diagnosis not present

## 2019-06-24 DIAGNOSIS — H35372 Puckering of macula, left eye: Secondary | ICD-10-CM | POA: Diagnosis not present

## 2019-07-01 ENCOUNTER — Ambulatory Visit (INDEPENDENT_AMBULATORY_CARE_PROVIDER_SITE_OTHER): Payer: PPO | Admitting: *Deleted

## 2019-07-01 DIAGNOSIS — I459 Conduction disorder, unspecified: Secondary | ICD-10-CM

## 2019-07-01 LAB — CUP PACEART REMOTE DEVICE CHECK
Battery Remaining Longevity: 93 mo
Battery Voltage: 3 V
Brady Statistic AP VP Percent: 0 %
Brady Statistic AP VS Percent: 0 %
Brady Statistic AS VP Percent: 0 %
Brady Statistic AS VS Percent: 0 %
Brady Statistic RA Percent Paced: 0.07 %
Brady Statistic RV Percent Paced: 99.19 %
Date Time Interrogation Session: 20200826024723
Implantable Lead Implant Date: 20200217
Implantable Lead Implant Date: 20200217
Implantable Lead Implant Date: 20200217
Implantable Lead Location: 753858
Implantable Lead Location: 753859
Implantable Lead Location: 753860
Implantable Lead Model: 4398
Implantable Lead Model: 5076
Implantable Lead Model: 5076
Implantable Pulse Generator Implant Date: 20200217
Lead Channel Impedance Value: 228 Ohm
Lead Channel Impedance Value: 304 Ohm
Lead Channel Impedance Value: 323 Ohm
Lead Channel Impedance Value: 323 Ohm
Lead Channel Impedance Value: 323 Ohm
Lead Channel Impedance Value: 342 Ohm
Lead Channel Impedance Value: 380 Ohm
Lead Channel Impedance Value: 380 Ohm
Lead Channel Impedance Value: 437 Ohm
Lead Channel Impedance Value: 437 Ohm
Lead Channel Impedance Value: 456 Ohm
Lead Channel Impedance Value: 513 Ohm
Lead Channel Impedance Value: 532 Ohm
Lead Channel Impedance Value: 551 Ohm
Lead Channel Pacing Threshold Amplitude: 0.5 V
Lead Channel Pacing Threshold Amplitude: 1.125 V
Lead Channel Pacing Threshold Amplitude: 1.25 V
Lead Channel Pacing Threshold Pulse Width: 0.4 ms
Lead Channel Pacing Threshold Pulse Width: 0.4 ms
Lead Channel Pacing Threshold Pulse Width: 1 ms
Lead Channel Sensing Intrinsic Amplitude: 0.5 mV
Lead Channel Sensing Intrinsic Amplitude: 0.5 mV
Lead Channel Sensing Intrinsic Amplitude: 19 mV
Lead Channel Sensing Intrinsic Amplitude: 19 mV
Lead Channel Setting Pacing Amplitude: 1.75 V
Lead Channel Setting Pacing Amplitude: 2.5 V
Lead Channel Setting Pacing Amplitude: 3.5 V
Lead Channel Setting Pacing Pulse Width: 0.4 ms
Lead Channel Setting Pacing Pulse Width: 1 ms
Lead Channel Setting Sensing Sensitivity: 1.2 mV

## 2019-07-07 ENCOUNTER — Telehealth (HOSPITAL_COMMUNITY): Payer: Self-pay | Admitting: *Deleted

## 2019-07-07 NOTE — Telephone Encounter (Signed)
Almyra Free at [facility] called to report pt is having a tooth extracted tomorrow and wants to know if he needs to hold eliquis.  Routed to [place] for advice

## 2019-07-07 NOTE — Telephone Encounter (Signed)
Called Almyra Free back no answer/left vm

## 2019-07-07 NOTE — Telephone Encounter (Signed)
He can hold 2 days before and day of.

## 2019-07-10 ENCOUNTER — Encounter: Payer: Self-pay | Admitting: Cardiology

## 2019-07-10 NOTE — Progress Notes (Signed)
Remote pacemaker transmission.   

## 2019-07-17 ENCOUNTER — Other Ambulatory Visit: Payer: Self-pay

## 2019-07-17 ENCOUNTER — Ambulatory Visit (HOSPITAL_COMMUNITY)
Admission: RE | Admit: 2019-07-17 | Discharge: 2019-07-17 | Disposition: A | Discharge status: Home / Self Care | Payer: PPO | Source: Ambulatory Visit | Attending: Cardiology | Admitting: Cardiology

## 2019-07-17 ENCOUNTER — Encounter (HOSPITAL_COMMUNITY): Payer: Self-pay | Admitting: Cardiology

## 2019-07-17 VITALS — BP 115/60 | HR 68 | Wt 156.8 lb

## 2019-07-17 DIAGNOSIS — Z7901 Long term (current) use of anticoagulants: Secondary | ICD-10-CM | POA: Insufficient documentation

## 2019-07-17 DIAGNOSIS — I4819 Other persistent atrial fibrillation: Secondary | ICD-10-CM

## 2019-07-17 DIAGNOSIS — I2510 Atherosclerotic heart disease of native coronary artery without angina pectoris: Secondary | ICD-10-CM | POA: Insufficient documentation

## 2019-07-17 DIAGNOSIS — Z8249 Family history of ischemic heart disease and other diseases of the circulatory system: Secondary | ICD-10-CM | POA: Diagnosis not present

## 2019-07-17 DIAGNOSIS — I5022 Chronic systolic (congestive) heart failure: Secondary | ICD-10-CM | POA: Insufficient documentation

## 2019-07-17 DIAGNOSIS — I255 Ischemic cardiomyopathy: Secondary | ICD-10-CM | POA: Diagnosis not present

## 2019-07-17 DIAGNOSIS — N189 Chronic kidney disease, unspecified: Secondary | ICD-10-CM | POA: Diagnosis not present

## 2019-07-17 DIAGNOSIS — I482 Chronic atrial fibrillation, unspecified: Secondary | ICD-10-CM | POA: Diagnosis not present

## 2019-07-17 DIAGNOSIS — Z87891 Personal history of nicotine dependence: Secondary | ICD-10-CM | POA: Insufficient documentation

## 2019-07-17 DIAGNOSIS — I252 Old myocardial infarction: Secondary | ICD-10-CM | POA: Diagnosis not present

## 2019-07-17 DIAGNOSIS — Z79899 Other long term (current) drug therapy: Secondary | ICD-10-CM | POA: Diagnosis not present

## 2019-07-17 DIAGNOSIS — I442 Atrioventricular block, complete: Secondary | ICD-10-CM | POA: Diagnosis not present

## 2019-07-17 DIAGNOSIS — E7849 Other hyperlipidemia: Secondary | ICD-10-CM | POA: Diagnosis not present

## 2019-07-17 DIAGNOSIS — E785 Hyperlipidemia, unspecified: Secondary | ICD-10-CM | POA: Diagnosis not present

## 2019-07-17 LAB — LIPID PANEL
Cholesterol: 118 mg/dL (ref 0–200)
HDL: 47 mg/dL (ref >40)
LDL Cholesterol: 60 mg/dL (ref 0–99)
Total CHOL/HDL Ratio: 2.5 RATIO
Triglycerides: 54 mg/dL (ref <150)
VLDL: 11 mg/dL (ref 0–40)

## 2019-07-17 LAB — BASIC METABOLIC PANEL
Anion gap: 8 (ref 5–15)
BUN: 17 mg/dL (ref 8–23)
CO2: 28 mmol/L (ref 22–32)
Calcium: 9.1 mg/dL (ref 8.9–10.3)
Chloride: 104 mmol/L (ref 98–111)
Creatinine, Ser: 1.32 mg/dL — ABNORMAL HIGH (ref 0.61–1.24)
GFR calc Af Amer: 53 mL/min — ABNORMAL LOW (ref >60)
GFR calc non Af Amer: 46 mL/min — ABNORMAL LOW (ref >60)
Glucose, Bld: 99 mg/dL (ref 70–99)
Potassium: 4.3 mmol/L (ref 3.5–5.1)
Sodium: 140 mmol/L (ref 135–145)

## 2019-07-17 MED ORDER — SPIRONOLACTONE 25 MG PO TABS: 12.5000 mg | ORAL_TABLET | Freq: Every day | ORAL | 3 refills | Status: DC

## 2019-07-17 MED ORDER — FUROSEMIDE 20 MG PO TABS: 40.0000 mg | ORAL_TABLET | Freq: Every day | ORAL | 6 refills | Status: DC

## 2019-07-17 NOTE — Patient Instructions (Signed)
INCREASE Lasix to 40mg  (2 tabs) daily  START Spironolactone 12.5mg  (1/2 tab) daily  Labs today We will only contact you if something comes back abnormal or we need to make some changes. Otherwise no news is good news!  REPEAT LABS IN 1 WEEK AT [facility].  CAN BE DONE SAME DAY AS YOUR ECHO.  Your physician has requested that you have an echocardiogram at [address]. You will get a call to schedule this appointment. Echocardiography is a painless test that uses sound waves to create images of your heart. It provides your doctor with information about the size and shape of your heart and how well your heart's chambers and valves are working. This procedure takes approximately one hour. There are no restrictions for this procedure.  Your physician recommends that you schedule a follow-up appointment in: 3 months with Dr Aundra Dubin.   At the [facility], you and your health needs are our priority. As part of our continuing mission to provide you with exceptional heart care, we have created designated Provider Care Teams. These Care Teams include your primary Cardiologist (physician) and Advanced Practice Providers (APPs- Physician Assistants and Nurse Practitioners) who all work together to provide you with the care you need, when you need it.   You may see any of the following providers on your designated Care Team at your next follow up: Marland Kitchen Dr Glori Bickers . Dr [name] . Darrick Grinder, NP   Please be sure to bring in all your medications bottles to every appointment.

## 2019-07-17 NOTE — Progress Notes (Signed)
Medication Samples have been provided to the patient.  Drug name: Eliquis       Strength: 5mg         Qty: 1 box  LOT: [id number]  Exp.Date: 12/22  Dosing instructions: 1 tab twice a day  The patient has been instructed regarding the correct time, dose, and frequency of taking this medication, including desired effects and most common side effects.   Linus Orn Esra Frankowski 1:03 PM 07/17/2019

## 2019-07-17 NOTE — Addendum Note (Signed)
Encounter addended by: Valeda Malm, RN on: 07/17/2019 1:04 PM  Actions taken: Clinical Note Signed

## 2019-07-19 NOTE — Addendum Note (Signed)
Encounter addended by: Larey Dresser, MD on: 07/19/2019 7:00 PM  Actions taken: Clinical Note Signed, Level of Service modified, Visit diagnoses modified

## 2019-07-19 NOTE — Progress Notes (Signed)
Patient ID: Mark Lane, male   DOB: 15-Sep-1922, 85 y.o.   MRN: [id number] PCP: Dr. Forde Dandy Cardiology: Dr. Aundra Dubin  85 y.o.with history of CAD and ischemic cardiomyopathy presents for followup of CAD and CHF.  He had an anterolateral STEMI in 7/11 with ostial occlusion of a large 2nd diagonal that was treated with BMS.  EF immediately post-MI was 35%.  He has also been noted to have Mobitz Type I 2nd degree AV block at times.  Last echo in 7/14 showed EF 30-35% with septal/apical akinesis and anterior hypokinesis.    In 2/20, he developed complete heart block and had implantation of a Medtronic CRT-P device.  He subsequently developed atrial fibrillation which has become chronic, now on apixaban.   He continues to do well symptomatically.  Does yardwork, mows his grass.  Takes care of his disabled wife.  Has family to help with shopping.  Short of breath only if he walks fast.  No orthopnea/PND.  He remains in atrial fibrillation, does not feel palpitations.    ECG (personally reviewed): atrial fibrillation with v-pacing  Labs (6/13): LDL 68, HDL 40 Labs (2/14): K 3.9, creatinine 1.2 Labs (8/14): K 4.1, creatinine 1.4 Labs (12/14): creatinine 1.3 Labs (4/15): K 4.9, creatinine 1.92, BNP 494.6 Labs (9/15): K 3.9, creatinine 1.4, LDL 133, HDL 43 Labs (3/16): K 3.9, creatinine 1.22, LDL 76 Labs (10/16): K 3.8, creatinine 1.15 Labs (3/18): K 3.4, creatinine 1.22 Labs (7/18): LDL 43, HDL 43, LFTs normal Labs (10/18): K 4.1, creatinine 1.17, BNP 298 Labs (6/20): K 4.4, creatinine 1.27, hgb 11.7  PMH: 1. Hyperlipidemia 2. Nephrolithiasis 3. CAD: Anterolateral STEMI in 7/11.  LHC with ostial total occlusion of a large D2, dominant LCx, small RCA.  Patient had BMS to D2.  4. Complete heart block: Has Medtronic CRT-P device.  5. Ischemic cardiomyopathy: Echo (7/11) with EF 35% and peri-apical akinesis, grade II diastolic dysfunction, normal RV size and systolic function.  Echo (7/14) with EF 30-35%,  septal/apical akinesis, anterior hypokinesis, mild MR/AI.  6. CKD 7. Atrial fibrillation: Chronic.   SH: Married, lives in [place], prior tobacco.  Has a farm, grows peaches.   FH: CAD  ROS: All systems reviewed and negative except as per HPI  Current Outpatient Medications  Medication Sig Dispense Refill  . apixaban (ELIQUIS) 5 MG TABS tablet Take 1 tablet (5 mg total) by mouth 2 (two) times daily. 180 tablet 3  . carvedilol (COREG) 3.125 MG tablet Take 1 tablet (3.125 mg total) by mouth 2 (two) times daily. 60 tablet 6  . cholecalciferol (VITAMIN D) 1000 UNITS tablet Take 1,000 Units by mouth daily. 2 tablets    . furosemide (LASIX) 20 MG tablet Take 2 tablets (40 mg total) by mouth daily. 60 tablet 6  . lisinopril (PRINIVIL,ZESTRIL) 10 MG tablet TAKE 1 TABLET DAILY 30 tablet 5  . simvastatin (ZOCOR) 20 MG tablet Take 2 tablets (40 mg total) by mouth at bedtime. 180 tablet 3  . spironolactone (ALDACTONE) 25 MG tablet Take 0.5 tablets (12.5 mg total) by mouth daily. 90 tablet 3   No current facility-administered medications for this encounter.    BP 115/60   Pulse 68   Wt 71.1 kg (156 lb 12.8 oz)   SpO2 98%   BMI 25.31 kg/m  General: NAD Neck: JVP 8 cm, no thyromegaly or thyroid nodule.  Lungs: Clear to auscultation bilaterally with normal respiratory effort. CV: Nondisplaced PMI.  Heart irregular S1/S2, no S3/S4, no murmur.  1+ edema  1/2 to knees bilaterally.  No carotid bruit.  Normal pedal pulses.  Abdomen: Soft, nontender, no hepatosplenomegaly, no distention.  Skin: Intact without lesions or rashes.  Neurologic: Alert and oriented x 3.  Psych: Normal affect. Extremities: No clubbing or cyanosis.  HEENT: Normal.   Assessment/Plan: 1. CAD: No ischemic symptoms.  Continue ASA 81 and statin.  2. Chronic systolic CHF: Ischemic cardiomyopathy. EF 30-35% on last echo in 2014, no significant change from post-MI echo in 2011. Now with Medtronic CRT-P device.  He appears mildly  volume overloaded on exam today.    - Increase Lasix to 40 mg daily.  BMET today and in 10 days.    - Add spironolactone 12.5 daily.  - Continue lisinopril and Coreg.  - No ICD given age.  - I will arrange for echo.  3. Hyperlipidemia:  On Zocor, check lipids today.  4. Complete heart block: S/p MDT CRT-P device placement.  5. Atrial fibrillation: Chronic.   - Continue apixaban.   Followup in 3 months.   [name] 07/19/2019

## 2019-07-22 ENCOUNTER — Other Ambulatory Visit: Payer: Self-pay

## 2019-07-22 ENCOUNTER — Ambulatory Visit (INDEPENDENT_AMBULATORY_CARE_PROVIDER_SITE_OTHER): Payer: PPO | Admitting: Internal Medicine

## 2019-07-22 ENCOUNTER — Ambulatory Visit (HOSPITAL_COMMUNITY): Payer: PPO | Attending: Cardiology

## 2019-07-22 ENCOUNTER — Encounter: Payer: Self-pay | Admitting: Internal Medicine

## 2019-07-22 ENCOUNTER — Other Ambulatory Visit: Payer: PPO | Admitting: *Deleted

## 2019-07-22 VITALS — BP 104/62 | HR 88 | Ht 66.0 in | Wt 154.0 lb

## 2019-07-22 DIAGNOSIS — I442 Atrioventricular block, complete: Secondary | ICD-10-CM | POA: Diagnosis not present

## 2019-07-22 DIAGNOSIS — Z950 Presence of cardiac pacemaker: Secondary | ICD-10-CM | POA: Diagnosis not present

## 2019-07-22 DIAGNOSIS — I509 Heart failure, unspecified: Secondary | ICD-10-CM | POA: Diagnosis not present

## 2019-07-22 DIAGNOSIS — I4819 Other persistent atrial fibrillation: Secondary | ICD-10-CM | POA: Diagnosis not present

## 2019-07-22 NOTE — Patient Instructions (Signed)
Medication Instructions:  Your physician recommends that you continue on your current medications as directed. Please refer to the Current Medication list given to you today.  Labwork: You will get lab work today:  BMP  Testing/Procedures: None ordered.  Follow-Up: Your physician wants you to follow-up in: one year with Dr. Lovena Le.   You will receive a reminder letter in the mail two months in advance. If you don't receive a letter, please call our office to schedule the follow-up appointment.  Remote monitoring is used to monitor your Pacemaker from home. This monitoring reduces the number of office visits required to check your device to one time per year. It allows [place] to keep an eye on the functioning of your device to ensure it is working properly. You are scheduled for a device check from home on 09/30/2019. You may send your transmission at any time that day. If you have a wireless device, the transmission will be sent automatically. After your physician reviews your transmission, you will receive a postcard with your next transmission date.  Any Other Special Instructions Will Be Listed Below (If Applicable).  If you need a refill on your cardiac medications before your next appointment, please call your pharmacy.

## 2019-07-22 NOTE — Progress Notes (Signed)
HPI Mark Lane returns today for ongoing evaluation of CHB, s/p PPM insertion. He is a pleasant 85 yo man with a h/o CHB as well as high grade AV block, LV dysfunction, remote STEMI, EF 30%. He has class 2 symptoms, with mostly low output in nature. He underwent biv PPM insertion several months ago. In the interim, he has been stable. No chest pain or sob. No syncope. No edema.  No Known Allergies   Current Outpatient Medications  Medication Sig Dispense Refill  . apixaban (ELIQUIS) 5 MG TABS tablet Take 1 tablet (5 mg total) by mouth 2 (two) times daily. 180 tablet 3  . cholecalciferol (VITAMIN D) 1000 UNITS tablet Take 1,000 Units by mouth daily. 2 tablets    . furosemide (LASIX) 20 MG tablet Take 2 tablets (40 mg total) by mouth daily. 60 tablet 6  . lisinopril (PRINIVIL,ZESTRIL) 10 MG tablet TAKE 1 TABLET DAILY 30 tablet 5  . simvastatin (ZOCOR) 20 MG tablet Take 2 tablets (40 mg total) by mouth at bedtime. 180 tablet 3  . spironolactone (ALDACTONE) 25 MG tablet Take 0.5 tablets (12.5 mg total) by mouth daily. 90 tablet 3  . carvedilol (COREG) 3.125 MG tablet Take 1 tablet (3.125 mg total) by mouth 2 (two) times daily. 60 tablet 6   No current facility-administered medications for this visit.      Past Medical History:  Diagnosis Date  . Coronary artery disease   . Hyperlipidemia     ROS:   All systems reviewed and negative except as noted in the HPI.   Past Surgical History:  Procedure Laterality Date  . BIV PACEMAKER INSERTION CRT-P N/A 12/22/2018   Procedure: BIV PACEMAKER INSERTION CRT-P;  Surgeon: Evans Lance, MD;  Location: [facility];  Service: Cardiovascular;  Laterality: N/A;  . CARDIAC CATHETERIZATION    . HEMORRHOID SURGERY     removed a hemorrhoid in the 1970s  . KIDNEY STONE SURGERY     remove a kidney stone     Family History  Problem Relation Age of Onset  . Heart attack Father        in his 21's  . Cancer Brother        1/2  .  Cancer Sister   . Heart Problems Brother 61       pacemaker 2/2     Social History   Socioeconomic History  . Marital status: Married    Spouse name: Not on file  . Number of children: Not on file  . Years of education: Not on file  . Highest education level: Not on file  Occupational History  . Not on file  Social Needs  . Financial resource strain: Not on file  . Food insecurity    Worry: Not on file    Inability: Not on file  . Transportation needs    Medical: Not on file    Non-medical: Not on file  Tobacco Use  . Smoking status: Former Research scientist (life sciences)  . Smokeless tobacco: Never Used  Substance and Sexual Activity  . Alcohol use: No  . Drug use: No  . Sexual activity: Not on file  Lifestyle  . Physical activity    Days per week: Not on file    Minutes per session: Not on file  . Stress: Not on file  Relationships  . Social Herbalist on phone: Not on file    Gets together: Not on file  Attends religious service: Not on file    Active member of club or organization: Not on file    Attends meetings of clubs or organizations: Not on file    Relationship status: Not on file  . Intimate partner violence    Fear of current or ex partner: Not on file    Emotionally abused: Not on file    Physically abused: Not on file    Forced sexual activity: Not on file  Other Topics Concern  . Not on file  Social History Narrative   He is married, has 1-son. He does not smoke, or use alcohol     BP 104/62   Pulse 88   Ht 5\' 6"  (1.676 m)   Wt 154 lb (69.9 kg)   SpO2 96%   BMI 24.86 kg/m   Physical Exam:  Well appearing NAD HEENT: Unremarkable Neck:  No JVD, no thyromegally Lymphatics:  No adenopathy Back:  No CVA tenderness Lungs:  Clear with no wheezes HEART:  Regular rate rhythm, no murmurs, no rubs, no clicks Abd:  soft, positive bowel sounds, no organomegally, no rebound, no guarding Ext:  2 plus pulses, no edema, no cyanosis, no clubbing Skin:  No  rashes no nodules Neuro:  CN II through XII intact, motor grossly intact   DEVICE  Normal device function.  See PaceArt for details.   Assess/Plan: 1. CHB - he is asymptomatic, s/p PPM insertion. 2. PPM - his Medtronic Biv PPM is working normally. We will recheck in several months. 3. CAD - he denies anginal symptoms.Marland Kitchen He will continue his current meds.  Mikle Bosworth.D.

## 2019-07-23 LAB — BASIC METABOLIC PANEL
BUN/Creatinine Ratio: 16 (ref 10–24)
BUN: 21 mg/dL (ref 10–36)
CO2: 27 mmol/L (ref 20–29)
Calcium: 9.4 mg/dL (ref 8.6–10.2)
Chloride: 102 mmol/L (ref 96–106)
Creatinine, Ser: 1.34 mg/dL — ABNORMAL HIGH (ref 0.76–1.27)
GFR calc Af Amer: 52 mL/min/{1.73_m2} — ABNORMAL LOW (ref >59)
GFR calc non Af Amer: 45 mL/min/{1.73_m2} — ABNORMAL LOW (ref >59)
Glucose: 114 mg/dL — ABNORMAL HIGH (ref 65–99)
Potassium: 4 mmol/L (ref 3.5–5.2)
Sodium: 142 mmol/L (ref 134–144)

## 2019-07-27 ENCOUNTER — Telehealth: Payer: Self-pay | Admitting: Internal Medicine

## 2019-07-27 NOTE — Telephone Encounter (Signed)
Call returned to Pt.  Gave results of ECHO.

## 2019-07-27 NOTE — Telephone Encounter (Signed)
New Message   Patient is calling to obtain results of his echocardiogram.

## 2019-08-15 ENCOUNTER — Other Ambulatory Visit: Payer: Self-pay | Admitting: Physician Assistant

## 2019-08-20 DIAGNOSIS — I255 Ischemic cardiomyopathy: Secondary | ICD-10-CM | POA: Diagnosis not present

## 2019-08-20 DIAGNOSIS — I4891 Unspecified atrial fibrillation: Secondary | ICD-10-CM | POA: Diagnosis not present

## 2019-08-20 DIAGNOSIS — N1831 Chronic kidney disease, stage 3a: Secondary | ICD-10-CM | POA: Diagnosis not present

## 2019-08-20 DIAGNOSIS — E785 Hyperlipidemia, unspecified: Secondary | ICD-10-CM | POA: Diagnosis not present

## 2019-08-20 DIAGNOSIS — R7301 Impaired fasting glucose: Secondary | ICD-10-CM | POA: Diagnosis not present

## 2019-08-20 DIAGNOSIS — N200 Calculus of kidney: Secondary | ICD-10-CM | POA: Diagnosis not present

## 2019-08-20 DIAGNOSIS — D649 Anemia, unspecified: Secondary | ICD-10-CM | POA: Diagnosis not present

## 2019-08-20 DIAGNOSIS — I129 Hypertensive chronic kidney disease with stage 1 through stage 4 chronic kidney disease, or unspecified chronic kidney disease: Secondary | ICD-10-CM | POA: Diagnosis not present

## 2019-08-20 DIAGNOSIS — Z950 Presence of cardiac pacemaker: Secondary | ICD-10-CM | POA: Diagnosis not present

## 2019-08-20 DIAGNOSIS — I2510 Atherosclerotic heart disease of native coronary artery without angina pectoris: Secondary | ICD-10-CM | POA: Diagnosis not present

## 2019-09-18 DIAGNOSIS — Z23 Encounter for immunization: Secondary | ICD-10-CM | POA: Diagnosis not present

## 2019-09-26 ENCOUNTER — Other Ambulatory Visit: Payer: Self-pay | Admitting: Internal Medicine

## 2019-09-30 ENCOUNTER — Ambulatory Visit (INDEPENDENT_AMBULATORY_CARE_PROVIDER_SITE_OTHER): Payer: PPO | Admitting: *Deleted

## 2019-09-30 DIAGNOSIS — I442 Atrioventricular block, complete: Secondary | ICD-10-CM

## 2019-09-30 LAB — CUP PACEART REMOTE DEVICE CHECK
Battery Remaining Longevity: 93 mo
Battery Voltage: 2.98 V
Brady Statistic AP VP Percent: 0 %
Brady Statistic AP VS Percent: 0 %
Brady Statistic AS VP Percent: 0 %
Brady Statistic AS VS Percent: 0 %
Brady Statistic RA Percent Paced: 0.27 %
Brady Statistic RV Percent Paced: 97.87 %
Date Time Interrogation Session: 20201125031317
Implantable Lead Implant Date: 20200217
Implantable Lead Implant Date: 20200217
Implantable Lead Implant Date: 20200217
Implantable Lead Location: 753858
Implantable Lead Location: 753859
Implantable Lead Location: 753860
Implantable Lead Model: 4398
Implantable Lead Model: 5076
Implantable Lead Model: 5076
Implantable Pulse Generator Implant Date: 20200217
Lead Channel Impedance Value: 228 Ohm
Lead Channel Impedance Value: 285 Ohm
Lead Channel Impedance Value: 323 Ohm
Lead Channel Impedance Value: 323 Ohm
Lead Channel Impedance Value: 342 Ohm
Lead Channel Impedance Value: 361 Ohm
Lead Channel Impedance Value: 380 Ohm
Lead Channel Impedance Value: 380 Ohm
Lead Channel Impedance Value: 437 Ohm
Lead Channel Impedance Value: 456 Ohm
Lead Channel Impedance Value: 475 Ohm
Lead Channel Impedance Value: 532 Ohm
Lead Channel Impedance Value: 551 Ohm
Lead Channel Impedance Value: 589 Ohm
Lead Channel Pacing Threshold Amplitude: 0.5 V
Lead Channel Pacing Threshold Amplitude: 0.875 V
Lead Channel Pacing Threshold Amplitude: 1.25 V
Lead Channel Pacing Threshold Pulse Width: 0.4 ms
Lead Channel Pacing Threshold Pulse Width: 0.4 ms
Lead Channel Pacing Threshold Pulse Width: 1 ms
Lead Channel Sensing Intrinsic Amplitude: 0.5 mV
Lead Channel Sensing Intrinsic Amplitude: 0.5 mV
Lead Channel Sensing Intrinsic Amplitude: 19.25 mV
Lead Channel Sensing Intrinsic Amplitude: 19.25 mV
Lead Channel Setting Pacing Amplitude: 1.75 V
Lead Channel Setting Pacing Amplitude: 2.5 V
Lead Channel Setting Pacing Amplitude: 2.5 V
Lead Channel Setting Pacing Pulse Width: 0.4 ms
Lead Channel Setting Pacing Pulse Width: 1 ms
Lead Channel Setting Sensing Sensitivity: 1.2 mV

## 2019-10-16 ENCOUNTER — Encounter (HOSPITAL_COMMUNITY): Payer: PPO | Admitting: Cardiology

## 2019-10-16 ENCOUNTER — Ambulatory Visit (HOSPITAL_COMMUNITY)
Admission: RE | Admit: 2019-10-16 | Discharge: 2019-10-16 | Disposition: A | Discharge status: Home / Self Care | Payer: PPO | Source: Ambulatory Visit | Attending: Cardiology | Admitting: Cardiology

## 2019-10-16 ENCOUNTER — Encounter (HOSPITAL_COMMUNITY): Payer: Self-pay | Admitting: Cardiology

## 2019-10-16 ENCOUNTER — Other Ambulatory Visit: Payer: Self-pay

## 2019-10-16 VITALS — BP 98/54 | HR 70 | Wt 153.8 lb

## 2019-10-16 DIAGNOSIS — I5022 Chronic systolic (congestive) heart failure: Secondary | ICD-10-CM | POA: Diagnosis not present

## 2019-10-16 DIAGNOSIS — Z8249 Family history of ischemic heart disease and other diseases of the circulatory system: Secondary | ICD-10-CM | POA: Diagnosis not present

## 2019-10-16 DIAGNOSIS — E785 Hyperlipidemia, unspecified: Secondary | ICD-10-CM | POA: Insufficient documentation

## 2019-10-16 DIAGNOSIS — N189 Chronic kidney disease, unspecified: Secondary | ICD-10-CM | POA: Insufficient documentation

## 2019-10-16 DIAGNOSIS — I442 Atrioventricular block, complete: Secondary | ICD-10-CM | POA: Insufficient documentation

## 2019-10-16 DIAGNOSIS — I255 Ischemic cardiomyopathy: Secondary | ICD-10-CM | POA: Diagnosis not present

## 2019-10-16 DIAGNOSIS — I252 Old myocardial infarction: Secondary | ICD-10-CM | POA: Insufficient documentation

## 2019-10-16 DIAGNOSIS — I2510 Atherosclerotic heart disease of native coronary artery without angina pectoris: Secondary | ICD-10-CM | POA: Diagnosis not present

## 2019-10-16 DIAGNOSIS — Z7901 Long term (current) use of anticoagulants: Secondary | ICD-10-CM | POA: Insufficient documentation

## 2019-10-16 DIAGNOSIS — Z79899 Other long term (current) drug therapy: Secondary | ICD-10-CM | POA: Insufficient documentation

## 2019-10-16 DIAGNOSIS — Z950 Presence of cardiac pacemaker: Secondary | ICD-10-CM | POA: Diagnosis not present

## 2019-10-16 DIAGNOSIS — I482 Chronic atrial fibrillation, unspecified: Secondary | ICD-10-CM | POA: Insufficient documentation

## 2019-10-16 LAB — COMPREHENSIVE METABOLIC PANEL
ALT: 13 U/L (ref 0–44)
AST: 24 U/L (ref 15–41)
Albumin: 3.9 g/dL (ref 3.5–5.0)
Alkaline Phosphatase: 41 U/L (ref 38–126)
Anion gap: 8 (ref 5–15)
BUN: 21 mg/dL (ref 8–23)
CO2: 28 mmol/L (ref 22–32)
Calcium: 9.4 mg/dL (ref 8.9–10.3)
Chloride: 103 mmol/L (ref 98–111)
Creatinine, Ser: 1.79 mg/dL — ABNORMAL HIGH (ref 0.61–1.24)
GFR calc Af Amer: 37 mL/min — ABNORMAL LOW (ref >60)
GFR calc non Af Amer: 32 mL/min — ABNORMAL LOW (ref >60)
Glucose, Bld: 104 mg/dL — ABNORMAL HIGH (ref 70–99)
Potassium: 3.9 mmol/L (ref 3.5–5.1)
Sodium: 139 mmol/L (ref 135–145)
Total Bilirubin: 0.7 mg/dL (ref 0.3–1.2)
Total Protein: 6.9 g/dL (ref 6.5–8.1)

## 2019-10-16 LAB — TSH: TSH: 1.923 u[IU]/mL (ref 0.350–4.500)

## 2019-10-16 NOTE — Progress Notes (Signed)
Medication Samples have been provided to the patient.  Drug name: Eliquis       Strength: 5mg         Qty: 42  LOT: [id number]   Exp.Date: 8/22 9/22  Dosing instructions: 1 tab bid  The patient has been instructed regarding the correct time, dose, and frequency of taking this medication, including desired effects and most common side effects.   Valeda Malm 3:31 PM 10/16/2019

## 2019-10-16 NOTE — Patient Instructions (Addendum)
Labs today We will only contact you if something comes back abnormal or we need to make some changes. Otherwise no news is good news!  Your physician recommends that you schedule a follow-up appointment in: 3 months with Dr Aundra Dubin onTuesday, March 16th, 2021 at 2:00pm. [id number]  At the [facility], you and your health needs are our priority. As part of our continuing mission to provide you with exceptional heart care, we have created designated Provider Care Teams. These Care Teams include your primary Cardiologist (physician) and Advanced Practice Providers (APPs- Physician Assistants and Nurse Practitioners) who all work together to provide you with the care you need, when you need it.   You may see any of the following providers on your designated Care Team at your next follow up: Marland Kitchen Dr Glori Bickers . Dr [name] . Darrick Grinder, NP . Lyda Jester, PA . Audry Riles, PharmD   Please be sure to bring in all your medications bottles to every appointment.

## 2019-10-18 NOTE — Progress Notes (Signed)
Patient ID: Mark Lane, male   DOB: September 19, 1922, 85 y.o.   MRN: [id number] PCP: Dr. Forde Dandy Cardiology: Dr. Aundra Dubin  85 y.o.with history of CAD and ischemic cardiomyopathy presents for followup of CAD and CHF.  He had an anterolateral STEMI in 7/11 with ostial occlusion of a large 2nd diagonal that was treated with BMS.  EF immediately post-MI was 35%.  He has also been noted to have Mobitz Type I 2nd degree AV block at times.  Last echo in 7/14 showed EF 30-35% with septal/apical akinesis and anterior hypokinesis.    In 2/20, he developed complete heart block and had implantation of a Medtronic CRT-P device.  He subsequently developed atrial fibrillation which has become chronic.   Echo was done in 9/20, showing EF 45-50%, mild LVH, normal RV.   He is doing well on higher Lasix dose. Weight down 3 lbs and no more peripheral edema.  No dyspnea walking on flat ground.  No orthopnea/PND.  Mild dyspnea with stairs.  Rare atypical (nonexertional) chest pain.    Labs (6/13): LDL 68, HDL 40 Labs (2/14): K 3.9, creatinine 1.2 Labs (8/14): K 4.1, creatinine 1.4 Labs (12/14): creatinine 1.3 Labs (4/15): K 4.9, creatinine 1.92, BNP 494.6 Labs (9/15): K 3.9, creatinine 1.4, LDL 133, HDL 43 Labs (3/16): K 3.9, creatinine 1.22, LDL 76 Labs (10/16): K 3.8, creatinine 1.15 Labs (3/18): K 3.4, creatinine 1.22 Labs (7/18): LDL 43, HDL 43, LFTs normal Labs (10/18): K 4.1, creatinine 1.17, BNP 298 Labs (6/20): K 4.4, creatinine 1.27, hgb 11.7 Labs (9/20): K 4, creatinine 1.34, LDL 60  PMH: 1. Hyperlipidemia 2. Nephrolithiasis 3. CAD: Anterolateral STEMI in 7/11.  LHC with ostial total occlusion of a large D2, dominant LCx, small RCA.  Patient had BMS to D2.  4. Complete heart block: Has Medtronic CRT-P device.  5. Ischemic cardiomyopathy: Echo (7/11) with EF 35% and peri-apical akinesis, grade II diastolic dysfunction, normal RV size and systolic function.  Echo (7/14) with EF 30-35%, septal/apical akinesis,  anterior hypokinesis, mild MR/AI.  - Echo (9/20): EF 45-50%, mild LVH, normal RV.  6. CKD 7. Atrial fibrillation: Chronic.   SH: Married, lives in [place], prior tobacco.  Has a farm, grows peaches.   FH: CAD  ROS: All systems reviewed and negative except as per HPI  Current Outpatient Medications  Medication Sig Dispense Refill  . apixaban (ELIQUIS) 5 MG TABS tablet Take 1 tablet (5 mg total) by mouth 2 (two) times daily. 180 tablet 3  . carvedilol (COREG) 3.125 MG tablet TAKE  (1)  TABLET TWICE A DAY. 180 tablet 3  . cholecalciferol (VITAMIN D) 1000 UNITS tablet Take 1,000 Units by mouth daily. 2 tablets    . furosemide (LASIX) 20 MG tablet Take 2 tablets (40 mg total) by mouth daily. 60 tablet 6  . lisinopril (ZESTRIL) 10 MG tablet TAKE 1 TABLET DAILY 30 tablet 0  . simvastatin (ZOCOR) 20 MG tablet Take 2 tablets (40 mg total) by mouth at bedtime. 180 tablet 3  . spironolactone (ALDACTONE) 25 MG tablet Take 0.5 tablets (12.5 mg total) by mouth daily. 90 tablet 3   No current facility-administered medications for this encounter.   BP (!) 98/54   Pulse 70   Wt 69.8 kg (153 lb 12.8 oz)   SpO2 99%   BMI 24.82 kg/m  General: NAD Neck: No JVD, no thyromegaly or thyroid nodule.  Lungs: Clear to auscultation bilaterally with normal respiratory effort. CV: Nondisplaced PMI.  Heart irregular S1/S2, no  S3/S4, no murmur.  No peripheral edema.  No carotid bruit.  Normal pedal pulses.  Abdomen: Soft, nontender, no hepatosplenomegaly, no distention.  Skin: Intact without lesions or rashes.  Neurologic: Alert and oriented x 3.  Psych: Normal affect. Extremities: No clubbing or cyanosis.  HEENT: Normal.   Assessment/Plan: 1. CAD: No ischemic symptoms.  Continue ASA 81 and statin.  2. Chronic systolic CHF: Ischemic cardiomyopathy. EF 45-50% (improved) on last echo in 9/20. Now with Medtronic CRT-P device.  He does not look volume overloaded. NYHA class II symptoms.  - Continue Lasix 40 mg  daily. BMET today.     - Continue spironolactone 12.5 daily.  - Continue lisinopril and Coreg.  - No ICD given age 85.  - I will arrange for echo.  3. Hyperlipidemia:  On Zocor, good lipids 9/20.  4. Complete heart block: S/p MDT CRT-P device placement.  5. Atrial fibrillation: Chronic.   - Continue apixaban.   Followup in 3 months.   [name] 10/18/2019

## 2019-10-19 ENCOUNTER — Telehealth (HOSPITAL_COMMUNITY): Payer: Self-pay

## 2019-10-19 DIAGNOSIS — I5022 Chronic systolic (congestive) heart failure: Secondary | ICD-10-CM

## 2019-10-19 MED ORDER — FUROSEMIDE 20 MG PO TABS: 20.0000 mg | ORAL_TABLET | Freq: Every day | ORAL | 6 refills | Status: DC

## 2019-10-19 NOTE — Telephone Encounter (Signed)
Pt aware of lab results and med adjustment. Will rtc in 2 weeks for bmet. Verbalized understanding.

## 2019-10-19 NOTE — Telephone Encounter (Signed)
-----   Message from Larey Dresser, MD sent at 10/19/2019 12:25 AM EST ----- Decrease Lasix back to 20 mg daily with rise in creatinine, keep sodium in diet low. BMET 2 wks.

## 2019-10-27 NOTE — Progress Notes (Signed)
PPM remote 

## 2019-10-31 ENCOUNTER — Other Ambulatory Visit: Payer: Self-pay | Admitting: Internal Medicine

## 2019-11-02 ENCOUNTER — Other Ambulatory Visit: Payer: Self-pay

## 2019-11-02 ENCOUNTER — Ambulatory Visit (HOSPITAL_COMMUNITY)
Admission: RE | Admit: 2019-11-02 | Discharge: 2019-11-02 | Disposition: A | Discharge status: Home / Self Care | Payer: PPO | Source: Ambulatory Visit | Attending: Cardiology | Admitting: Cardiology

## 2019-11-02 DIAGNOSIS — I5022 Chronic systolic (congestive) heart failure: Secondary | ICD-10-CM | POA: Diagnosis not present

## 2019-11-02 LAB — BASIC METABOLIC PANEL
Anion gap: 8 (ref 5–15)
BUN: 21 mg/dL (ref 8–23)
CO2: 29 mmol/L (ref 22–32)
Calcium: 9.4 mg/dL (ref 8.9–10.3)
Chloride: 103 mmol/L (ref 98–111)
Creatinine, Ser: 1.64 mg/dL — ABNORMAL HIGH (ref 0.61–1.24)
GFR calc Af Amer: 41 mL/min — ABNORMAL LOW (ref >60)
GFR calc non Af Amer: 35 mL/min — ABNORMAL LOW (ref >60)
Glucose, Bld: 101 mg/dL — ABNORMAL HIGH (ref 70–99)
Potassium: 4.3 mmol/L (ref 3.5–5.1)
Sodium: 140 mmol/L (ref 135–145)

## 2019-12-17 ENCOUNTER — Other Ambulatory Visit: Payer: Self-pay | Admitting: Physician Assistant

## 2019-12-30 ENCOUNTER — Ambulatory Visit (INDEPENDENT_AMBULATORY_CARE_PROVIDER_SITE_OTHER): Payer: PPO | Admitting: *Deleted

## 2019-12-30 DIAGNOSIS — I442 Atrioventricular block, complete: Secondary | ICD-10-CM

## 2019-12-30 LAB — CUP PACEART REMOTE DEVICE CHECK
Battery Remaining Longevity: 85 mo
Battery Voltage: 2.97 V
Brady Statistic AP VP Percent: 0 %
Brady Statistic AP VS Percent: 0 %
Brady Statistic AS VP Percent: 0 %
Brady Statistic AS VS Percent: 0 %
Brady Statistic RA Percent Paced: 0.72 %
Brady Statistic RV Percent Paced: 98.17 %
Date Time Interrogation Session: 20210223213326
Implantable Lead Implant Date: 20200217
Implantable Lead Implant Date: 20200217
Implantable Lead Implant Date: 20200217
Implantable Lead Location: 753858
Implantable Lead Location: 753859
Implantable Lead Location: 753860
Implantable Lead Model: 4398
Implantable Lead Model: 5076
Implantable Lead Model: 5076
Implantable Pulse Generator Implant Date: 20200217
Lead Channel Impedance Value: 247 Ohm
Lead Channel Impedance Value: 285 Ohm
Lead Channel Impedance Value: 323 Ohm
Lead Channel Impedance Value: 342 Ohm
Lead Channel Impedance Value: 342 Ohm
Lead Channel Impedance Value: 342 Ohm
Lead Channel Impedance Value: 380 Ohm
Lead Channel Impedance Value: 399 Ohm
Lead Channel Impedance Value: 475 Ohm
Lead Channel Impedance Value: 494 Ohm
Lead Channel Impedance Value: 513 Ohm
Lead Channel Impedance Value: 513 Ohm
Lead Channel Impedance Value: 570 Ohm
Lead Channel Impedance Value: 608 Ohm
Lead Channel Pacing Threshold Amplitude: 0.5 V
Lead Channel Pacing Threshold Amplitude: 0.75 V
Lead Channel Pacing Threshold Amplitude: 1 V
Lead Channel Pacing Threshold Pulse Width: 0.4 ms
Lead Channel Pacing Threshold Pulse Width: 0.4 ms
Lead Channel Pacing Threshold Pulse Width: 1 ms
Lead Channel Sensing Intrinsic Amplitude: 0.5 mV
Lead Channel Sensing Intrinsic Amplitude: 0.5 mV
Lead Channel Sensing Intrinsic Amplitude: 18.75 mV
Lead Channel Sensing Intrinsic Amplitude: 18.75 mV
Lead Channel Setting Pacing Amplitude: 2 V
Lead Channel Setting Pacing Amplitude: 2.5 V
Lead Channel Setting Pacing Amplitude: 2.5 V
Lead Channel Setting Pacing Pulse Width: 0.4 ms
Lead Channel Setting Pacing Pulse Width: 1 ms
Lead Channel Setting Sensing Sensitivity: 1.2 mV

## 2019-12-30 NOTE — Progress Notes (Signed)
PPM Remote  

## 2020-01-19 ENCOUNTER — Encounter (HOSPITAL_COMMUNITY): Payer: PPO | Admitting: Cardiology

## 2020-02-18 DIAGNOSIS — I2510 Atherosclerotic heart disease of native coronary artery without angina pectoris: Secondary | ICD-10-CM | POA: Diagnosis not present

## 2020-02-18 DIAGNOSIS — I5189 Other ill-defined heart diseases: Secondary | ICD-10-CM | POA: Diagnosis not present

## 2020-02-18 DIAGNOSIS — N1831 Chronic kidney disease, stage 3a: Secondary | ICD-10-CM | POA: Diagnosis not present

## 2020-02-18 DIAGNOSIS — E7849 Other hyperlipidemia: Secondary | ICD-10-CM | POA: Diagnosis not present

## 2020-02-18 DIAGNOSIS — N200 Calculus of kidney: Secondary | ICD-10-CM | POA: Diagnosis not present

## 2020-02-18 DIAGNOSIS — I4891 Unspecified atrial fibrillation: Secondary | ICD-10-CM | POA: Diagnosis not present

## 2020-02-18 DIAGNOSIS — I255 Ischemic cardiomyopathy: Secondary | ICD-10-CM | POA: Diagnosis not present

## 2020-02-18 DIAGNOSIS — I129 Hypertensive chronic kidney disease with stage 1 through stage 4 chronic kidney disease, or unspecified chronic kidney disease: Secondary | ICD-10-CM | POA: Diagnosis not present

## 2020-02-18 DIAGNOSIS — R7301 Impaired fasting glucose: Secondary | ICD-10-CM | POA: Diagnosis not present

## 2020-02-24 ENCOUNTER — Other Ambulatory Visit: Payer: Self-pay

## 2020-02-24 ENCOUNTER — Encounter (HOSPITAL_COMMUNITY): Payer: Self-pay | Admitting: Cardiology

## 2020-02-24 ENCOUNTER — Ambulatory Visit (HOSPITAL_COMMUNITY)
Admission: RE | Admit: 2020-02-24 | Discharge: 2020-02-24 | Disposition: A | Discharge status: Home / Self Care | Payer: PPO | Source: Ambulatory Visit | Attending: Cardiology | Admitting: Cardiology

## 2020-02-24 VITALS — BP 94/52 | HR 78 | Wt 155.6 lb

## 2020-02-24 DIAGNOSIS — I5022 Chronic systolic (congestive) heart failure: Secondary | ICD-10-CM | POA: Diagnosis not present

## 2020-02-24 DIAGNOSIS — I482 Chronic atrial fibrillation, unspecified: Secondary | ICD-10-CM | POA: Diagnosis not present

## 2020-02-24 DIAGNOSIS — I252 Old myocardial infarction: Secondary | ICD-10-CM | POA: Insufficient documentation

## 2020-02-24 DIAGNOSIS — N189 Chronic kidney disease, unspecified: Secondary | ICD-10-CM | POA: Insufficient documentation

## 2020-02-24 DIAGNOSIS — I442 Atrioventricular block, complete: Secondary | ICD-10-CM

## 2020-02-24 DIAGNOSIS — I2510 Atherosclerotic heart disease of native coronary artery without angina pectoris: Secondary | ICD-10-CM | POA: Insufficient documentation

## 2020-02-24 DIAGNOSIS — I255 Ischemic cardiomyopathy: Secondary | ICD-10-CM | POA: Diagnosis not present

## 2020-02-24 DIAGNOSIS — Z7901 Long term (current) use of anticoagulants: Secondary | ICD-10-CM | POA: Insufficient documentation

## 2020-02-24 DIAGNOSIS — Z79899 Other long term (current) drug therapy: Secondary | ICD-10-CM | POA: Insufficient documentation

## 2020-02-24 DIAGNOSIS — E785 Hyperlipidemia, unspecified: Secondary | ICD-10-CM | POA: Diagnosis not present

## 2020-02-24 DIAGNOSIS — I4819 Other persistent atrial fibrillation: Secondary | ICD-10-CM | POA: Diagnosis not present

## 2020-02-24 LAB — BASIC METABOLIC PANEL
Anion gap: 8 (ref 5–15)
BUN: 18 mg/dL (ref 8–23)
CO2: 26 mmol/L (ref 22–32)
Calcium: 9.2 mg/dL (ref 8.9–10.3)
Chloride: 104 mmol/L (ref 98–111)
Creatinine, Ser: 1.4 mg/dL — ABNORMAL HIGH (ref 0.61–1.24)
GFR calc Af Amer: 49 mL/min — ABNORMAL LOW (ref >60)
GFR calc non Af Amer: 42 mL/min — ABNORMAL LOW (ref >60)
Glucose, Bld: 94 mg/dL (ref 70–99)
Potassium: 4.1 mmol/L (ref 3.5–5.1)
Sodium: 138 mmol/L (ref 135–145)

## 2020-02-24 MED ORDER — FUROSEMIDE 40 MG PO TABS: 40.0000 mg | ORAL_TABLET | Freq: Every day | ORAL | 5 refills | Status: DC

## 2020-02-24 NOTE — Patient Instructions (Signed)
INCREASE Lasix to 40mg  daily.  Routine lab work today. Will notify you of abnormal results  Repeat labs in 10days  Follow up in 4 months

## 2020-02-25 NOTE — Progress Notes (Signed)
Patient ID: Mark Lane, male   DOB: 06/26/1922, 86 y.o.   MRN: [id number] PCP: Dr. Forde Dandy Cardiology: Dr. Aundra Dubin  86 y.o.with history of CAD and ischemic cardiomyopathy presents for followup of CAD and CHF.  He had an anterolateral STEMI in 7/11 with ostial occlusion of a large 2nd diagonal that was treated with BMS.  EF immediately post-MI was 35%.  He has also been noted to have Mobitz Type I 2nd degree AV block at times.  Last echo in 7/14 showed EF 30-35% with septal/apical akinesis and anterior hypokinesis.    In 2/20, he developed complete heart block and had implantation of a Medtronic CRT-P device.  He subsequently developed atrial fibrillation which has become chronic.   Echo was done in 9/20, showing EF 45-50%, mild LVH, normal RV.   He is stable symptomatically.  Falls asleep easily during the day. Moving slower but still able to do what he wants to do around the house and yard.  He mows the grass.  He is short of breath if he has to carry a load but not walking on flat ground. No chest pain.    ECG (personally reviewed): atrial fibrillation, BiV paced   Labs (6/13): LDL 68, HDL 40 Labs (2/14): K 3.9, creatinine 1.2 Labs (8/14): K 4.1, creatinine 1.4 Labs (12/14): creatinine 1.3 Labs (4/15): K 4.9, creatinine 1.92, BNP 494.6 Labs (9/15): K 3.9, creatinine 1.4, LDL 133, HDL 43 Labs (3/16): K 3.9, creatinine 1.22, LDL 76 Labs (10/16): K 3.8, creatinine 1.15 Labs (3/18): K 3.4, creatinine 1.22 Labs (7/18): LDL 43, HDL 43, LFTs normal Labs (10/18): K 4.1, creatinine 1.17, BNP 298 Labs (6/20): K 4.4, creatinine 1.27, hgb 11.7 Labs (9/20): K 4, creatinine 1.34, LDL 60 Labs (12/20): K 4.3, creatinine 1.64  PMH: 1. Hyperlipidemia 2. Nephrolithiasis 3. CAD: Anterolateral STEMI in 7/11.  LHC with ostial total occlusion of a large D2, dominant LCx, small RCA.  Patient had BMS to D2.  4. Complete heart block: Has Medtronic CRT-P device.  5. Ischemic cardiomyopathy: Echo (7/11) with EF  35% and peri-apical akinesis, grade II diastolic dysfunction, normal RV size and systolic function.  Echo (7/14) with EF 30-35%, septal/apical akinesis, anterior hypokinesis, mild MR/AI.  - Echo (9/20): EF 45-50%, mild LVH, normal RV.  6. CKD 7. Atrial fibrillation: Chronic.   SH: Married, lives in [place], prior tobacco.  Has a farm, grows peaches.   FH: CAD  ROS: All systems reviewed and negative except as per HPI  Current Outpatient Medications  Medication Sig Dispense Refill  . apixaban (ELIQUIS) 5 MG TABS tablet Take 1 tablet (5 mg total) by mouth 2 (two) times daily. 180 tablet 3  . carvedilol (COREG) 3.125 MG tablet TAKE  (1)  TABLET TWICE A DAY. 180 tablet 3  . cholecalciferol (VITAMIN D) 1000 UNITS tablet Take 1,000 Units by mouth daily. 2 tablets    . furosemide (LASIX) 40 MG tablet Take 1 tablet (40 mg total) by mouth daily. 30 tablet 5  . lisinopril (ZESTRIL) 10 MG tablet TAKE 1 TABLET DAILY 90 tablet 3  . simvastatin (ZOCOR) 20 MG tablet Take 2 tablets (40 mg total) by mouth at bedtime. 180 tablet 3  . spironolactone (ALDACTONE) 25 MG tablet Take 0.5 tablets (12.5 mg total) by mouth daily. 90 tablet 3   No current facility-administered medications for this encounter.   BP (!) 94/52   Pulse 78   Wt 70.6 kg (155 lb 9.6 oz)   SpO2 99%  BMI 25.11 kg/m  General: NAD Neck: JVP 8-9 cm, no thyromegaly or thyroid nodule.  Lungs: Clear to auscultation bilaterally with normal respiratory effort. CV: Nondisplaced PMI.  Heart regular S1/S2, no S3/S4, no murmur.  2+ edema 1/2 to knees bilaterally.  No carotid bruit.  Normal pedal pulses.  Abdomen: Soft, nontender, no hepatosplenomegaly, no distention.  Skin: Intact without lesions or rashes.  Neurologic: Alert and oriented x 3.  Psych: Normal affect. Extremities: No clubbing or cyanosis.  HEENT: Normal.   Assessment/Plan: 1. CAD: No ischemic symptoms.  Continue ASA 81 and statin.  2. Chronic systolic CHF: Ischemic  cardiomyopathy. EF 45-50% (improved) on last echo in 9/20. Now with Medtronic CRT-P device.  He looks mildly volume overloaded today. NYHA class II symptoms.  - Increase Lasix to 40 mg daily. BMET today and again in 10 days.      - Continue spironolactone 12.5 daily.  - Continue lisinopril and Coreg.  - No ICD given age.  3. Hyperlipidemia:  On Zocor, good lipids 9/20.  4. Complete heart block: S/p MDT CRT-P device placement.  5. Atrial fibrillation: Chronic.   - Continue apixaban.   Followup in 4 months.   [name] 02/25/2020

## 2020-03-10 ENCOUNTER — Other Ambulatory Visit (HOSPITAL_COMMUNITY): Payer: Self-pay

## 2020-03-10 MED ORDER — LISINOPRIL 10 MG PO TABS: 10.0000 mg | ORAL_TABLET | Freq: Every day | ORAL | 3 refills | Status: DC

## 2020-03-11 ENCOUNTER — Ambulatory Visit (HOSPITAL_COMMUNITY)
Admission: RE | Admit: 2020-03-11 | Discharge: 2020-03-11 | Disposition: A | Discharge status: Home / Self Care | Payer: PPO | Source: Ambulatory Visit | Attending: Internal Medicine | Admitting: Internal Medicine

## 2020-03-11 ENCOUNTER — Other Ambulatory Visit: Payer: Self-pay

## 2020-03-11 ENCOUNTER — Telehealth (HOSPITAL_COMMUNITY): Payer: Self-pay

## 2020-03-11 DIAGNOSIS — I442 Atrioventricular block, complete: Secondary | ICD-10-CM | POA: Diagnosis not present

## 2020-03-11 LAB — BASIC METABOLIC PANEL
Anion gap: 8 (ref 5–15)
BUN: 27 mg/dL — ABNORMAL HIGH (ref 8–23)
CO2: 27 mmol/L (ref 22–32)
Calcium: 9.1 mg/dL (ref 8.9–10.3)
Chloride: 105 mmol/L (ref 98–111)
Creatinine, Ser: 1.75 mg/dL — ABNORMAL HIGH (ref 0.61–1.24)
GFR calc Af Amer: 37 mL/min — ABNORMAL LOW (ref >60)
GFR calc non Af Amer: 32 mL/min — ABNORMAL LOW (ref >60)
Glucose, Bld: 104 mg/dL — ABNORMAL HIGH (ref 70–99)
Potassium: 3.9 mmol/L (ref 3.5–5.1)
Sodium: 140 mmol/L (ref 135–145)

## 2020-03-11 MED ORDER — FUROSEMIDE 40 MG PO TABS: ORAL_TABLET | ORAL | 5 refills | Status: DC

## 2020-03-11 NOTE — Telephone Encounter (Signed)
Pt aware of results and recommendations to alternate lasix 40mg  daily with 20mg  every other day. Verbalized understanding.

## 2020-03-11 NOTE — Telephone Encounter (Signed)
-----   Message from Larey Dresser, MD sent at 03/11/2020 12:50 PM EDT ----- Decrease Lasix to 40 mg daily alternating with 20 mg daily.

## 2020-03-30 ENCOUNTER — Ambulatory Visit (INDEPENDENT_AMBULATORY_CARE_PROVIDER_SITE_OTHER): Payer: PPO | Admitting: *Deleted

## 2020-03-30 DIAGNOSIS — I442 Atrioventricular block, complete: Secondary | ICD-10-CM

## 2020-03-30 LAB — CUP PACEART REMOTE DEVICE CHECK
Battery Remaining Longevity: 92 mo
Battery Voltage: 2.97 V
Brady Statistic AP VP Percent: 0 %
Brady Statistic AP VS Percent: 0 %
Brady Statistic AS VP Percent: 0 %
Brady Statistic AS VS Percent: 0 %
Brady Statistic RA Percent Paced: 0.73 %
Brady Statistic RV Percent Paced: 96.48 %
Date Time Interrogation Session: 20210525221826
Implantable Lead Implant Date: 20200217
Implantable Lead Implant Date: 20200217
Implantable Lead Implant Date: 20200217
Implantable Lead Location: 753858
Implantable Lead Location: 753859
Implantable Lead Location: 753860
Implantable Lead Model: 4398
Implantable Lead Model: 5076
Implantable Lead Model: 5076
Implantable Pulse Generator Implant Date: 20200217
Lead Channel Impedance Value: 285 Ohm
Lead Channel Impedance Value: 304 Ohm
Lead Channel Impedance Value: 342 Ohm
Lead Channel Impedance Value: 361 Ohm
Lead Channel Impedance Value: 399 Ohm
Lead Channel Impedance Value: 399 Ohm
Lead Channel Impedance Value: 418 Ohm
Lead Channel Impedance Value: 456 Ohm
Lead Channel Impedance Value: 475 Ohm
Lead Channel Impedance Value: 513 Ohm
Lead Channel Impedance Value: 570 Ohm
Lead Channel Impedance Value: 570 Ohm
Lead Channel Impedance Value: 627 Ohm
Lead Channel Impedance Value: 684 Ohm
Lead Channel Pacing Threshold Amplitude: 0.5 V
Lead Channel Pacing Threshold Amplitude: 1 V
Lead Channel Pacing Threshold Amplitude: 1 V
Lead Channel Pacing Threshold Pulse Width: 0.4 ms
Lead Channel Pacing Threshold Pulse Width: 0.4 ms
Lead Channel Pacing Threshold Pulse Width: 1 ms
Lead Channel Sensing Intrinsic Amplitude: 0.625 mV
Lead Channel Sensing Intrinsic Amplitude: 0.625 mV
Lead Channel Sensing Intrinsic Amplitude: 15.125 mV
Lead Channel Sensing Intrinsic Amplitude: 15.125 mV
Lead Channel Setting Pacing Amplitude: 1.5 V
Lead Channel Setting Pacing Amplitude: 2.5 V
Lead Channel Setting Pacing Amplitude: 2.5 V
Lead Channel Setting Pacing Pulse Width: 0.4 ms
Lead Channel Setting Pacing Pulse Width: 1 ms
Lead Channel Setting Sensing Sensitivity: 1.2 mV

## 2020-03-31 NOTE — Progress Notes (Signed)
Remote pacemaker transmission.   

## 2020-04-16 ENCOUNTER — Other Ambulatory Visit (HOSPITAL_COMMUNITY): Payer: Self-pay | Admitting: Cardiology

## 2020-05-13 ENCOUNTER — Other Ambulatory Visit (HOSPITAL_COMMUNITY): Payer: Self-pay | Admitting: Cardiology

## 2020-06-27 ENCOUNTER — Emergency Department (HOSPITAL_COMMUNITY)
Admission: EM | Admit: 2020-06-27 | Discharge: 2020-06-28 | Disposition: A | Discharge status: Home / Self Care | Payer: PPO | Attending: Emergency Medicine | Admitting: Emergency Medicine

## 2020-06-27 ENCOUNTER — Other Ambulatory Visit: Payer: Self-pay

## 2020-06-27 DIAGNOSIS — R195 Other fecal abnormalities: Secondary | ICD-10-CM | POA: Diagnosis present

## 2020-06-27 DIAGNOSIS — Z5321 Procedure and treatment not carried out due to patient leaving prior to being seen by health care provider: Secondary | ICD-10-CM | POA: Insufficient documentation

## 2020-06-27 LAB — TYPE AND SCREEN
ABO/RH(D): A NEG
Antibody Screen: NEGATIVE

## 2020-06-27 LAB — COMPREHENSIVE METABOLIC PANEL
ALT: 17 U/L (ref 0–44)
AST: 31 U/L (ref 15–41)
Albumin: 3.8 g/dL (ref 3.5–5.0)
Alkaline Phosphatase: 39 U/L (ref 38–126)
Anion gap: 11 (ref 5–15)
BUN: 26 mg/dL — ABNORMAL HIGH (ref 8–23)
CO2: 24 mmol/L (ref 22–32)
Calcium: 9.4 mg/dL (ref 8.9–10.3)
Chloride: 101 mmol/L (ref 98–111)
Creatinine, Ser: 1.85 mg/dL — ABNORMAL HIGH (ref 0.61–1.24)
GFR calc Af Amer: 35 mL/min — ABNORMAL LOW (ref >60)
GFR calc non Af Amer: 30 mL/min — ABNORMAL LOW (ref >60)
Glucose, Bld: 127 mg/dL — ABNORMAL HIGH (ref 70–99)
Potassium: 3.9 mmol/L (ref 3.5–5.1)
Sodium: 136 mmol/L (ref 135–145)
Total Bilirubin: 0.7 mg/dL (ref 0.3–1.2)
Total Protein: 7.1 g/dL (ref 6.5–8.1)

## 2020-06-27 LAB — CBC
HCT: 38.3 % — ABNORMAL LOW (ref 39.0–52.0)
Hemoglobin: 12.2 g/dL — ABNORMAL LOW (ref 13.0–17.0)
MCH: 30 pg (ref 26.0–34.0)
MCHC: 31.9 g/dL (ref 30.0–36.0)
MCV: 94.1 fL (ref 80.0–100.0)
Platelets: 148 10*3/uL — ABNORMAL LOW (ref 150–400)
RBC: 4.07 MIL/uL — ABNORMAL LOW (ref 4.22–5.81)
RDW: 13.2 % (ref 11.5–15.5)
WBC: 3.3 10*3/uL — ABNORMAL LOW (ref 4.0–10.5)
nRBC: 0 % (ref 0.0–0.2)

## 2020-06-27 NOTE — ED Triage Notes (Signed)
Pt arrives via POV from home. Here with son who states patient has been having dark, tarry, diarrhea like stools since Thursday. VSS. NAD at present.

## 2020-06-27 NOTE — ED Notes (Signed)
Pt stated they wanted to leave due to the wait and [facility] staff encouraged them to stay but they left anyway.

## 2020-06-28 ENCOUNTER — Telehealth: Payer: Self-pay

## 2020-06-28 ENCOUNTER — Telehealth (INDEPENDENT_AMBULATORY_CARE_PROVIDER_SITE_OTHER): Payer: Self-pay | Admitting: Internal Medicine

## 2020-06-28 NOTE — Telephone Encounter (Signed)
Patient confirmed identity and requested I speak with his son Marguerite Olea.    Advised transmission received 06/27/20 at 10:00pm does not shopw any concerning issues with HR.  Pt in AF which he has been since 2020.    Caller states that on 8/22 pt reports feeling three thumps in chest and they were wondering if his Pacemaker "went off"  Explained that device is not an ICD, it will not shock him.  Nothing is showing up in report to explain what pt felt.

## 2020-06-28 NOTE — Telephone Encounter (Signed)
Called patient after receiving request from Dr. Constance Haw. Patient went to [facility] ER yesterday and left after waiting for 7 houd=rs according to patient's grand-daughter who answered the phone. He has been feeling weak and stool was black.Hgb 12.2. Will bring patient for OV tomorrow with Ms. Laurine Blazer, [facility]

## 2020-06-28 NOTE — Telephone Encounter (Signed)
The pt felt a something in on his pacemaker site. The pt daughter took him to the ER but after 8 hours they was not seen. The daughter wants to know if there anything on the transmission. I told them I would have the nurse give them a call back.

## 2020-06-29 ENCOUNTER — Ambulatory Visit (INDEPENDENT_AMBULATORY_CARE_PROVIDER_SITE_OTHER): Payer: PPO | Admitting: *Deleted

## 2020-06-29 ENCOUNTER — Other Ambulatory Visit: Payer: Self-pay

## 2020-06-29 ENCOUNTER — Ambulatory Visit (INDEPENDENT_AMBULATORY_CARE_PROVIDER_SITE_OTHER): Payer: PPO | Admitting: Gastroenterology

## 2020-06-29 ENCOUNTER — Encounter (INDEPENDENT_AMBULATORY_CARE_PROVIDER_SITE_OTHER): Payer: Self-pay | Admitting: Gastroenterology

## 2020-06-29 VITALS — BP 92/55 | HR 62 | Temp 97.8°F | Ht 65.0 in | Wt 139.3 lb

## 2020-06-29 DIAGNOSIS — R197 Diarrhea, unspecified: Secondary | ICD-10-CM | POA: Diagnosis not present

## 2020-06-29 DIAGNOSIS — R634 Abnormal weight loss: Secondary | ICD-10-CM | POA: Diagnosis not present

## 2020-06-29 DIAGNOSIS — R110 Nausea: Secondary | ICD-10-CM | POA: Diagnosis not present

## 2020-06-29 DIAGNOSIS — K921 Melena: Secondary | ICD-10-CM

## 2020-06-29 DIAGNOSIS — I440 Atrioventricular block, first degree: Secondary | ICD-10-CM

## 2020-06-29 MED ORDER — ONDANSETRON 4 MG PO TBDP: 4.0000 mg | ORAL_TABLET | Freq: Three times a day (TID) | ORAL | 0 refills | Status: DC | PRN

## 2020-06-29 MED ORDER — PANTOPRAZOLE SODIUM 40 MG PO TBEC: 40.0000 mg | DELAYED_RELEASE_TABLET | Freq: Every day | ORAL | 3 refills | Status: DC

## 2020-06-29 NOTE — Patient Instructions (Signed)
We are checking labs and stool studies.  I am starting Protonix for evaluation and to treat a possible stomach ulcer.  We will call with results.

## 2020-06-29 NOTE — Progress Notes (Signed)
Patient profile: Mark Lane is a 86 y.o. male seen for an acute appointment today for evaluation of dark stools, weakness, diarrhea.  He is accompanied by his son who helps with history.   History of Present Illness: Mark Lane reports over past 10 days having significant weakness associated w/ black stools over past 7 days. Denies any iron or pepto use prior to black stools. Did take one pepto dose after melena began. Having about 3 black stools a day the first 4 days but has slowed to one small dark stool yesterday. This morning stool looked more brown than black. Denies significant abd pain but endorses some nausea w/ the diarrhea. No frequent GERD symptoms. No dysphagia. Appetite fairly poor - eating small amounts since symptoms began overall and has lost some weight. No fevers, recent antibiotics.   His son states he is previously very active and was driving and living independently a month ago. Over past 10 days he has had weakness and required assistance. Son does feel he is better over the past 24 hours compared to several days ago.  He went to the ER 2 days ago but was not able to be seen due to the long wait. Wife also admitted to hospital x 3 days during his illness UTI/sepsis.      Non smoker. No alcohol. No frequent NSAIDs.      Wt Readings from Last 3 Encounters:  06/29/20 139 lb 4.8 oz (63.2 kg)  06/27/20 160 lb (72.6 kg)  02/24/20 155 lb 9.6 oz (70.6 kg)     Last Colonoscopy: does not recall, none on file  Last Endoscopy: none prior    Past Medical History:  Past Medical History:  Diagnosis Date  . Coronary artery disease   . Hyperlipidemia     Problem List: Patient Active Problem List   Diagnosis Date Noted  . Biventricular cardiac pacemaker in situ 07/22/2019  . Persistent atrial fibrillation ([place]) 07/22/2019  . Complete heart block ([place]) 12/22/2018  . Cardiomyopathy, ischemic 12/30/2012  . ACUT MI OTH LAT WALL SUBSQT EPIS CARE 05/29/2010  . 1st  degree AV block 02/23/2009  . Hyperlipidemia 02/22/2009  . Coronary atherosclerosis 02/22/2009    Past Surgical History: Past Surgical History:  Procedure Laterality Date  . BIV PACEMAKER INSERTION CRT-P N/A 12/22/2018   Procedure: BIV PACEMAKER INSERTION CRT-P;  Surgeon: Evans Lance, MD;  Location: [facility];  Service: Cardiovascular;  Laterality: N/A;  . CARDIAC CATHETERIZATION    . HEMORRHOID SURGERY     removed a hemorrhoid in the 1970s  . KIDNEY STONE SURGERY     remove a kidney stone    Allergies: No Known Allergies    Home Medications:  Current Outpatient Medications:  .  apixaban (ELIQUIS) 5 MG TABS tablet, Take 1 tablet (5 mg total) by mouth 2 (two) times daily., Disp: 180 tablet, Rfl: 3 .  carvedilol (COREG) 3.125 MG tablet, TAKE  (1)  TABLET TWICE A DAY., Disp: 180 tablet, Rfl: 3 .  cholecalciferol (VITAMIN D) 1000 UNITS tablet, Take 1,000 Units by mouth daily. 2 tablets, Disp: , Rfl:  .  furosemide (LASIX) 20 MG tablet, 40mg  daily alternating with 20mg  every other day, Disp: 30 tablet, Rfl: 5 .  furosemide (LASIX) 40 MG tablet, Take 40mg  daily alternating with 20mg  every other day, Disp: 30 tablet, Rfl: 5 .  lisinopril (ZESTRIL) 10 MG tablet, Take 1 tablet (10 mg total) by mouth daily., Disp: 90 tablet, Rfl: 3 .  simvastatin (  ZOCOR) 40 MG tablet, Take 1 tablet (40 mg total) by mouth at bedtime., Disp: 90 tablet, Rfl: 3 .  Apoaequorin (PREVAGEN PO), Take by mouth. Patient has been taking but stopped last week. (Patient not taking: Reported on 06/29/2020), Disp: , Rfl:  .  ondansetron (ZOFRAN ODT) 4 MG disintegrating tablet, Take 1 tablet (4 mg total) by mouth every 8 (eight) hours as needed for nausea or vomiting., Disp: 20 tablet, Rfl: 0 .  pantoprazole (PROTONIX) 40 MG tablet, Take 1 tablet (40 mg total) by mouth daily., Disp: 30 tablet, Rfl: 3 .  spironolactone (ALDACTONE) 25 MG tablet, Take 0.5 tablets (12.5 mg total) by mouth daily., Disp: 90 tablet, Rfl: 3     Family History: family history includes Cancer in his brother and sister; Heart Problems (age of onset: 21) in his brother; Heart attack in his father.    Social History:   reports that he has quit smoking. He has never used smokeless tobacco. He reports that he does not drink alcohol and does not use drugs.   Review of Systems: Constitutional: + Weight loss Eyes: No changes in vision. ENT: No oral lesions, sore throat.  GI: see HPI.  Heme/Lymph: No easy bruising.  CV: No chest pain.  GU: No hematuria.  Integumentary: No rashes.  Neuro: No headaches.  Psych: No depression/anxiety.  Endocrine: No heat/cold intolerance.  Allergic/Immunologic: No urticaria.  Resp: No cough, SOB.  Musculoskeletal: No joint swelling.    Physical Examination: BP (!) 92/55 (BP Location: Right Arm, Patient Position: Sitting, Cuff Size: Normal)   Pulse 62   Temp 97.8 F (36.6 C) (Oral)   Ht 5\' 5"  (1.651 m)   Wt 139 lb 4.8 oz (63.2 kg)   BMI 23.18 kg/m  Gen: NAD, alert and oriented x 4, in wheelchair, stands w/ assistance HEENT: PEERLA, EOMI, Neck: supple, no JVD Chest: CTA bilaterally, no wheezes, crackles, or other adventitious sounds CV: RRR, no m/g/c/r Abd: soft, NT, ND, +BS in all four quadrants; no HSM, guarding, ridigity, or rebound tenderness Rectal-brown Hemoccult negative stool in rectal vault Ext: no edema, well perfused with 2+ pulses, Skin: no rash or lesions noted on observed skin Lymph: no noted LAD  Data Reviewed:  Labs done in ER on 06/27/2020-CBC with hemoglobin 12.2, normocytic, white count 3.3, creatinine 1.85, GFR 30   Assessment/Plan: Mr. Pesta is a 85 y.o. male seen for 7 to 10 days of weakness, diarrhea, dark stools.  1.  Diarrhea-check stool studies for evidence of infection.  He does note the diarrhea has slowed some over the past 24 hours.  We will repeat his hemoglobin given reported dark stool.  We will also repeat his CMP and hopefully creatinine will have  improved as diarrhea improves.  He does have some significant weight loss.  We will also check his thyroid.  Based on lab results may consider imaging for evaluation of weight loss in near future.  2.  Nausea-we will give Zofran to use as needed.  He denies any NSAID use but we will also start him on Protonix in case gastritis, etc. contributing.  Further recommendations pending.  Son in agreement with plan.  Will contact with further recommendations   Javaun was seen today for new patient (initial visit).  Diagnoses and all orders for this visit:  Loss of weight -     CBC with Differential -     COMPLETE METABOLIC PANEL WITH GFR -     Gastrointestinal Pathogen Panel PCR -  C. difficile GDH and Toxin A/B -     ondansetron (ZOFRAN ODT) 4 MG disintegrating tablet; Take 1 tablet (4 mg total) by mouth every 8 (eight) hours as needed for nausea or vomiting. -     pantoprazole (PROTONIX) 40 MG tablet; Take 1 tablet (40 mg total) by mouth daily. -     TSH + free T4  Diarrhea, unspecified type -     CBC with Differential -     COMPLETE METABOLIC PANEL WITH GFR -     Gastrointestinal Pathogen Panel PCR -     C. difficile GDH and Toxin A/B -     ondansetron (ZOFRAN ODT) 4 MG disintegrating tablet; Take 1 tablet (4 mg total) by mouth every 8 (eight) hours as needed for nausea or vomiting. -     pantoprazole (PROTONIX) 40 MG tablet; Take 1 tablet (40 mg total) by mouth daily. -     TSH + free T4  Nausea without vomiting -     CBC with Differential -     COMPLETE METABOLIC PANEL WITH GFR -     Gastrointestinal Pathogen Panel PCR -     C. difficile GDH and Toxin A/B -     ondansetron (ZOFRAN ODT) 4 MG disintegrating tablet; Take 1 tablet (4 mg total) by mouth every 8 (eight) hours as needed for nausea or vomiting. -     pantoprazole (PROTONIX) 40 MG tablet; Take 1 tablet (40 mg total) by mouth daily. -     TSH + free T4  Melena -     CBC with Differential -     COMPLETE METABOLIC PANEL  WITH GFR -     Gastrointestinal Pathogen Panel PCR -     C. difficile GDH and Toxin A/B -     ondansetron (ZOFRAN ODT) 4 MG disintegrating tablet; Take 1 tablet (4 mg total) by mouth every 8 (eight) hours as needed for nausea or vomiting. -     pantoprazole (PROTONIX) 40 MG tablet; Take 1 tablet (40 mg total) by mouth daily. -     TSH + free T4     Addendum-labs returned with a stable hemoglobin above 12.  His creatinine was improved slightly compared to 2 days ago.  I discussed results with son on June 30, 2020.  He is feeling some better.  Further recommendations pending stool study results.  40 min total patient care time, >50% face to face I personally performed the service, non-incident to. (WP)  Laurine Blazer, [facility]

## 2020-06-30 ENCOUNTER — Ambulatory Visit (HOSPITAL_BASED_OUTPATIENT_CLINIC_OR_DEPARTMENT_OTHER)
Admission: RE | Admit: 2020-06-30 | Discharge: 2020-06-30 | Disposition: A | Discharge status: Home / Self Care | Payer: PPO | Source: Ambulatory Visit | Attending: Cardiology | Admitting: Cardiology

## 2020-06-30 VITALS — BP 95/65 | HR 59 | Wt 142.4 lb

## 2020-06-30 DIAGNOSIS — I442 Atrioventricular block, complete: Secondary | ICD-10-CM

## 2020-06-30 DIAGNOSIS — I252 Old myocardial infarction: Secondary | ICD-10-CM | POA: Diagnosis not present

## 2020-06-30 DIAGNOSIS — Z20822 Contact with and (suspected) exposure to covid-19: Secondary | ICD-10-CM | POA: Diagnosis present

## 2020-06-30 DIAGNOSIS — E860 Dehydration: Secondary | ICD-10-CM | POA: Diagnosis present

## 2020-06-30 DIAGNOSIS — I255 Ischemic cardiomyopathy: Secondary | ICD-10-CM | POA: Diagnosis present

## 2020-06-30 DIAGNOSIS — E785 Hyperlipidemia, unspecified: Secondary | ICD-10-CM | POA: Diagnosis present

## 2020-06-30 DIAGNOSIS — K2940 Chronic atrophic gastritis without bleeding: Secondary | ICD-10-CM | POA: Diagnosis not present

## 2020-06-30 DIAGNOSIS — I4891 Unspecified atrial fibrillation: Secondary | ICD-10-CM | POA: Diagnosis not present

## 2020-06-30 DIAGNOSIS — L03113 Cellulitis of right upper limb: Secondary | ICD-10-CM | POA: Diagnosis present

## 2020-06-30 DIAGNOSIS — D649 Anemia, unspecified: Secondary | ICD-10-CM | POA: Diagnosis not present

## 2020-06-30 DIAGNOSIS — Z87442 Personal history of urinary calculi: Secondary | ICD-10-CM | POA: Insufficient documentation

## 2020-06-30 DIAGNOSIS — Z8249 Family history of ischemic heart disease and other diseases of the circulatory system: Secondary | ICD-10-CM | POA: Diagnosis not present

## 2020-06-30 DIAGNOSIS — Z7901 Long term (current) use of anticoagulants: Secondary | ICD-10-CM | POA: Insufficient documentation

## 2020-06-30 DIAGNOSIS — I482 Chronic atrial fibrillation, unspecified: Secondary | ICD-10-CM | POA: Diagnosis present

## 2020-06-30 DIAGNOSIS — Z79899 Other long term (current) drug therapy: Secondary | ICD-10-CM | POA: Insufficient documentation

## 2020-06-30 DIAGNOSIS — K921 Melena: Secondary | ICD-10-CM

## 2020-06-30 DIAGNOSIS — K449 Diaphragmatic hernia without obstruction or gangrene: Secondary | ICD-10-CM | POA: Diagnosis present

## 2020-06-30 DIAGNOSIS — R627 Adult failure to thrive: Secondary | ICD-10-CM | POA: Diagnosis present

## 2020-06-30 DIAGNOSIS — R109 Unspecified abdominal pain: Secondary | ICD-10-CM | POA: Insufficient documentation

## 2020-06-30 DIAGNOSIS — D631 Anemia in chronic kidney disease: Secondary | ICD-10-CM | POA: Diagnosis present

## 2020-06-30 DIAGNOSIS — I4819 Other persistent atrial fibrillation: Secondary | ICD-10-CM

## 2020-06-30 DIAGNOSIS — K2950 Unspecified chronic gastritis without bleeding: Secondary | ICD-10-CM | POA: Diagnosis not present

## 2020-06-30 DIAGNOSIS — K317 Polyp of stomach and duodenum: Secondary | ICD-10-CM | POA: Diagnosis present

## 2020-06-30 DIAGNOSIS — Z6823 Body mass index (BMI) 23.0-23.9, adult: Secondary | ICD-10-CM | POA: Diagnosis not present

## 2020-06-30 DIAGNOSIS — Z7982 Long term (current) use of aspirin: Secondary | ICD-10-CM | POA: Insufficient documentation

## 2020-06-30 DIAGNOSIS — Z950 Presence of cardiac pacemaker: Secondary | ICD-10-CM | POA: Diagnosis not present

## 2020-06-30 DIAGNOSIS — I5022 Chronic systolic (congestive) heart failure: Secondary | ICD-10-CM | POA: Insufficient documentation

## 2020-06-30 DIAGNOSIS — I2510 Atherosclerotic heart disease of native coronary artery without angina pectoris: Secondary | ICD-10-CM | POA: Diagnosis present

## 2020-06-30 DIAGNOSIS — K3189 Other diseases of stomach and duodenum: Secondary | ICD-10-CM | POA: Diagnosis not present

## 2020-06-30 DIAGNOSIS — R531 Weakness: Secondary | ICD-10-CM | POA: Diagnosis not present

## 2020-06-30 DIAGNOSIS — I5042 Chronic combined systolic (congestive) and diastolic (congestive) heart failure: Secondary | ICD-10-CM | POA: Diagnosis present

## 2020-06-30 DIAGNOSIS — I959 Hypotension, unspecified: Secondary | ICD-10-CM | POA: Diagnosis present

## 2020-06-30 DIAGNOSIS — E871 Hypo-osmolality and hyponatremia: Secondary | ICD-10-CM | POA: Diagnosis present

## 2020-06-30 DIAGNOSIS — R110 Nausea: Secondary | ICD-10-CM | POA: Insufficient documentation

## 2020-06-30 DIAGNOSIS — N189 Chronic kidney disease, unspecified: Secondary | ICD-10-CM | POA: Insufficient documentation

## 2020-06-30 DIAGNOSIS — R634 Abnormal weight loss: Secondary | ICD-10-CM | POA: Diagnosis present

## 2020-06-30 DIAGNOSIS — K2951 Unspecified chronic gastritis with bleeding: Secondary | ICD-10-CM | POA: Diagnosis present

## 2020-06-30 DIAGNOSIS — R197 Diarrhea, unspecified: Secondary | ICD-10-CM | POA: Diagnosis present

## 2020-06-30 DIAGNOSIS — N1832 Chronic kidney disease, stage 3b: Secondary | ICD-10-CM | POA: Diagnosis present

## 2020-06-30 DIAGNOSIS — Z87891 Personal history of nicotine dependence: Secondary | ICD-10-CM | POA: Diagnosis not present

## 2020-06-30 LAB — CBC WITH DIFFERENTIAL/PLATELET
Absolute Monocytes: 369 cells/uL (ref 200–950)
Basophils Absolute: 11 cells/uL (ref 0–200)
Basophils Relative: 0.2 %
Eosinophils Absolute: 39 cells/uL (ref 15–500)
Eosinophils Relative: 0.7 %
HCT: 39.1 % (ref 38.5–50.0)
Hemoglobin: 12.8 g/dL — ABNORMAL LOW (ref 13.2–17.1)
Lymphs Abs: 1826 cells/uL (ref 850–3900)
MCH: 30.7 pg (ref 27.0–33.0)
MCHC: 32.7 g/dL (ref 32.0–36.0)
MCV: 93.8 fL (ref 80.0–100.0)
MPV: 11.8 fL (ref 7.5–12.5)
Monocytes Relative: 6.7 %
Neutro Abs: 3256 cells/uL (ref 1500–7800)
Neutrophils Relative %: 59.2 %
Platelets: 157 10*3/uL (ref 140–400)
RBC: 4.17 10*6/uL — ABNORMAL LOW (ref 4.20–5.80)
RDW: 12.8 % (ref 11.0–15.0)
Total Lymphocyte: 33.2 %
WBC: 5.5 10*3/uL (ref 3.8–10.8)

## 2020-06-30 LAB — TSH+FREE T4: TSH W/REFLEX TO FT4: 1.53 mIU/L (ref 0.40–4.50)

## 2020-06-30 LAB — COMPLETE METABOLIC PANEL WITH GFR
AG Ratio: 1.4 (calc) (ref 1.0–2.5)
ALT: 13 U/L (ref 9–46)
AST: 26 U/L (ref 10–35)
Albumin: 4 g/dL (ref 3.6–5.1)
Alkaline phosphatase (APISO): 44 U/L (ref 35–144)
BUN/Creatinine Ratio: 17 (calc) (ref 6–22)
BUN: 28 mg/dL — ABNORMAL HIGH (ref 7–25)
CO2: 27 mmol/L (ref 20–32)
Calcium: 9 mg/dL (ref 8.6–10.3)
Chloride: 100 mmol/L (ref 98–110)
Creat: 1.64 mg/dL — ABNORMAL HIGH (ref 0.70–1.11)
GFR, Est African American: 40 mL/min/{1.73_m2} — ABNORMAL LOW (ref >=60)
GFR, Est Non African American: 35 mL/min/{1.73_m2} — ABNORMAL LOW (ref >=60)
Globulin: 2.9 g/dL (calc) (ref 1.9–3.7)
Glucose, Bld: 102 mg/dL (ref 65–139)
Potassium: 3.7 mmol/L (ref 3.5–5.3)
Sodium: 136 mmol/L (ref 135–146)
Total Bilirubin: 0.5 mg/dL (ref 0.2–1.2)
Total Protein: 6.9 g/dL (ref 6.1–8.1)

## 2020-06-30 LAB — IRON AND TIBC
Iron: 69 ug/dL (ref 45–182)
Saturation Ratios: 24 % (ref 17.9–39.5)
TIBC: 284 ug/dL (ref 250–450)
UIBC: 215 ug/dL

## 2020-06-30 LAB — FERRITIN: Ferritin: 231 ng/mL (ref 24–336)

## 2020-06-30 MED ORDER — APIXABAN 2.5 MG PO TABS: 2.5000 mg | ORAL_TABLET | Freq: Two times a day (BID) | ORAL | 6 refills | Status: DC

## 2020-06-30 NOTE — Progress Notes (Signed)
Medication Samples have been provided to the patient.  Drug name: Eliquis       Strength: 2.5mg         Qty: 4 boxes  LOT: [id number]  Exp.Date: 7/22  Dosing instructions: take 1 tablet twice daily  The patient has been instructed regarding the correct time, dose, and frequency of taking this medication, including desired effects and most common side effects.   Mark Lane 3:30 PM 06/30/2020

## 2020-06-30 NOTE — Progress Notes (Signed)
Patient ID: Mark Lane, male   DOB: 04-18-1922, 86 y.o.   MRN: [id number] PCP: Dr. Forde Dandy Cardiology: Dr. Aundra Dubin  86 y.o.with history of CAD and ischemic cardiomyopathy presents for followup of CAD and CHF.  He had an anterolateral STEMI in 7/11 with ostial occlusion of a large 2nd diagonal that was treated with BMS.  EF immediately post-MI was 35%.  He has also been noted to have Mobitz Type I 2nd degree AV block at times.  Last echo in 7/14 showed EF 30-35% with septal/apical akinesis and anterior hypokinesis.    In 2/20, he developed complete heart block and had implantation of a Medtronic CRT-P device.  He subsequently developed atrial fibrillation which has become chronic.   Echo was done in 9/20, showing EF 45-50%, mild LVH, normal RV.   About 10 days ago, his wife went into the hospital with a UTI.  After this happened, he developed progressive weakness and fatigue.  He has lost 13 lbs since last appointment.  He has been "staggering" when he walks per his family.  He denies lightheadedness or syncope.  No falls.  He is tiring very easily though he is not frankly short of breath. He has been having abdominal discomfort diffusely with occasional nausea.  He reports episodes of black stool about a week ago but this has resolved.  I was concerned that his symptoms were due to significant GI bleeding, but hgb has not been significantly low.  He saw GI yesterday and stool was heme negative. SBP is low in the 90s.  No fever, cough, chills.  No COVID-19 exposure.   Medtronic device interrogation: atrial fibrillation chronically, stable thoracic impedance, 98.5% BiV pacing.   ECG (personally reviewed): Atrial fibrillation with BiV pacing.   Labs (6/13): LDL 68, HDL 40 Labs (2/14): K 3.9, creatinine 1.2 Labs (8/14): K 4.1, creatinine 1.4 Labs (12/14): creatinine 1.3 Labs (4/15): K 4.9, creatinine 1.92, BNP 494.6 Labs (9/15): K 3.9, creatinine 1.4, LDL 133, HDL 43 Labs (3/16): K 3.9, creatinine  1.22, LDL 76 Labs (10/16): K 3.8, creatinine 1.15 Labs (3/18): K 3.4, creatinine 1.22 Labs (7/18): LDL 43, HDL 43, LFTs normal Labs (10/18): K 4.1, creatinine 1.17, BNP 298 Labs (6/20): K 4.4, creatinine 1.27, hgb 11.7 Labs (9/20): K 4, creatinine 1.34, LDL 60 Labs (12/20): K 4.3, creatinine 1.64 Labs (8/21): hgb 12.8, K 3.7, creatinine 1.64, LFTs normal, TSH normal  PMH: 1. Hyperlipidemia 2. Nephrolithiasis 3. CAD: Anterolateral STEMI in 7/11.  LHC with ostial total occlusion of a large D2, dominant LCx, small RCA.  Patient had BMS to D2.  4. Complete heart block: Has Medtronic CRT-P device.  5. Ischemic cardiomyopathy: Echo (7/11) with EF 35% and peri-apical akinesis, grade II diastolic dysfunction, normal RV size and systolic function.  Echo (7/14) with EF 30-35%, septal/apical akinesis, anterior hypokinesis, mild MR/AI.  - Echo (9/20): EF 45-50%, mild LVH, normal RV.  6. CKD 7. Atrial fibrillation: Chronic.   SH: Married, lives in [place], prior tobacco.  Has a farm, grows peaches.   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 30 tablet 6   carvedilol (COREG) 3.125 MG tablet TAKE  (1)  TABLET TWICE A DAY. 180 tablet 3   cholecalciferol (VITAMIN D) 1000 UNITS tablet Take 1,000 Units by mouth daily. 2 tablets  furosemide (LASIX) 20 MG tablet 40mg  daily alternating with 20mg  every other day 30 tablet 5   furosemide (LASIX) 40 MG tablet Take 40mg  daily alternating with 20mg  every other day 30 tablet 5   lisinopril (ZESTRIL) 10 MG tablet Take 1 tablet (10 mg total) by mouth daily. 90 tablet 3   ondansetron (ZOFRAN ODT) 4 MG disintegrating tablet Take 1 tablet (4 mg total) by mouth every 8 (eight) hours as needed for nausea or vomiting. 20 tablet 0   pantoprazole (PROTONIX) 40 MG tablet Take  1 tablet (40 mg total) by mouth daily. 30 tablet 3   simvastatin (ZOCOR) 40 MG tablet Take 1 tablet (40 mg total) by mouth at bedtime. 90 tablet 3   spironolactone (ALDACTONE) 25 MG tablet Take 12.5 mg by mouth daily.     No current facility-administered medications for this encounter.   BP 95/65    Pulse (!) 59    Wt 64.6 kg (142 lb 6.4 oz)    SpO2 99%    BMI 23.70 kg/m  General: NAD Neck: No JVD, no thyromegaly or thyroid nodule.  Lungs: Clear to auscultation bilaterally with normal respiratory effort. CV: Nondisplaced PMI.  Heart irregular S1/S2, no S3/S4, no murmur.  No peripheral edema.  No carotid bruit.  Normal pedal pulses.  Abdomen: Soft, nontender, no hepatosplenomegaly, no distention.  Skin: Intact without lesions or rashes.  Neurologic: Alert and oriented x 3.  Psych: Normal affect. Extremities: No clubbing or cyanosis.  HEENT: Normal.   Assessment/Plan: 1. CAD: Chest pain.  Continue ASA 81 and statin.  2. Chronic systolic CHF: Ischemic cardiomyopathy. EF 45-50% (improved) on last echo in 9/20. Now with Medtronic CRT-P device.  He is not volume overloaded by exam or Optivol, but symptoms are worse (NYHA class III).  ?Takotsubo episode with wife's illness (markedly worse after she went into the hospital). BP is low (SBP in 90s) and may contribute to fatigue.  - I will arrange for echo.  - Decrease Lasix to 20 mg daily.  - Continue spironolactone 12.5 daily.  - Continue Coreg 3.125 mg bid.  - Decrease lisinopril to 5 mg daily.  - No ICD given age.  3. Hyperlipidemia:  On Zocor, good lipids 9/20.  4. Complete heart block: S/p MDT CRT-P device placement.  5. Atrial fibrillation: Chronic.   - Decrease apixaban to 2.5 mg bid (age, creatinine > 1.5).   6. Nausea/abdominal discomfort: Heme negative stool and stable hgb do not suggest significant GI bleeding.  He has also had significant weight loss.  - Continue workup with GI and PCP.  - I will check iron studies.     Followup 1 month with PA/NP.   [name] 06/30/2020

## 2020-06-30 NOTE — Patient Instructions (Signed)
Decrease Eliquis to 2.5 mg Twice daily   Decrease Lisinopril to 5 mg Daily  Decrease Furosemide to 20 mg Daily  Labs done today  Your physician has requested that you have an echocardiogram. Echocardiography is a painless test that uses sound waves to create images of your heart. It provides your doctor with information about the size and shape of your heart and how well your heart's chambers and valves are working. This procedure takes approximately one hour. There are no restrictions for this procedure.  Your physician recommends that you schedule a follow-up appointment in: 1 month  If you have any questions or concerns before your next appointment please send [place] a message through [place] or call our office at [phone].    TO LEAVE A MESSAGE FOR THE NURSE SELECT OPTION 2, PLEASE LEAVE A MESSAGE INCLUDING: . YOUR NAME . DATE OF BIRTH . CALL BACK NUMBER . REASON FOR CALL**this is important as we prioritize the call backs  [place] AS LONG AS YOU CALL BEFORE 4:00 PM  At the [facility], you and your health needs are our priority. As part of our continuing mission to provide you with exceptional heart care, we have created designated Provider Care Teams. These Care Teams include your primary Cardiologist (physician) and Advanced Practice Providers (APPs- Physician Assistants and Nurse Practitioners) who all work together to provide you with the care you need, when you need it.   You may see any of the following providers on your designated Care Team at your next follow up: Marland Kitchen Dr Glori Bickers . Dr [name] . Darrick Grinder, NP . Lyda Jester, PA . Audry Riles, PharmD   Please be sure to bring in all your medications bottles to every appointment.

## 2020-07-01 ENCOUNTER — Other Ambulatory Visit (HOSPITAL_COMMUNITY): Payer: Self-pay | Admitting: Cardiology

## 2020-07-01 DIAGNOSIS — R197 Diarrhea, unspecified: Secondary | ICD-10-CM | POA: Diagnosis not present

## 2020-07-01 DIAGNOSIS — K921 Melena: Secondary | ICD-10-CM | POA: Diagnosis not present

## 2020-07-01 DIAGNOSIS — R110 Nausea: Secondary | ICD-10-CM | POA: Diagnosis not present

## 2020-07-01 DIAGNOSIS — R634 Abnormal weight loss: Secondary | ICD-10-CM | POA: Diagnosis not present

## 2020-07-01 LAB — CUP PACEART REMOTE DEVICE CHECK
Battery Remaining Longevity: 64 mo
Battery Voltage: 2.96 V
Brady Statistic RA Percent Paced: 0.44 %
Brady Statistic RV Percent Paced: 98.5 %
Date Time Interrogation Session: 20210826134643
Implantable Lead Implant Date: 20200217
Implantable Lead Implant Date: 20200217
Implantable Lead Implant Date: 20200217
Implantable Lead Location: 753858
Implantable Lead Location: 753859
Implantable Lead Location: 753860
Implantable Lead Model: 4398
Implantable Lead Model: 5076
Implantable Lead Model: 5076
Implantable Pulse Generator Implant Date: 20200217
Lead Channel Impedance Value: 285 Ohm
Lead Channel Impedance Value: 323 Ohm
Lead Channel Impedance Value: 323 Ohm
Lead Channel Impedance Value: 361 Ohm
Lead Channel Impedance Value: 380 Ohm
Lead Channel Impedance Value: 399 Ohm
Lead Channel Impedance Value: 399 Ohm
Lead Channel Impedance Value: 437 Ohm
Lead Channel Impedance Value: 532 Ohm
Lead Channel Impedance Value: 570 Ohm
Lead Channel Impedance Value: 570 Ohm
Lead Channel Impedance Value: 570 Ohm
Lead Channel Impedance Value: 627 Ohm
Lead Channel Impedance Value: 684 Ohm
Lead Channel Pacing Threshold Amplitude: 0.5 V
Lead Channel Pacing Threshold Amplitude: 1.375 V
Lead Channel Pacing Threshold Amplitude: 1.375 V
Lead Channel Pacing Threshold Pulse Width: 0.4 ms
Lead Channel Pacing Threshold Pulse Width: 0.4 ms
Lead Channel Pacing Threshold Pulse Width: 1 ms
Lead Channel Sensing Intrinsic Amplitude: 0.375 mV
Lead Channel Sensing Intrinsic Amplitude: 0.375 mV
Lead Channel Sensing Intrinsic Amplitude: 15 mV
Lead Channel Sensing Intrinsic Amplitude: 15 mV
Lead Channel Setting Pacing Amplitude: 2.5 V
Lead Channel Setting Pacing Amplitude: 2.5 V
Lead Channel Setting Pacing Amplitude: 2.75 V
Lead Channel Setting Pacing Pulse Width: 0.4 ms
Lead Channel Setting Pacing Pulse Width: 1 ms
Lead Channel Setting Sensing Sensitivity: 1.2 mV

## 2020-07-01 MED ORDER — LISINOPRIL 5 MG PO TABS
5.0000 mg | ORAL_TABLET | Freq: Every day | ORAL | 6 refills | Status: DC
Start: 2020-07-01 — End: 2020-07-05

## 2020-07-02 ENCOUNTER — Other Ambulatory Visit: Payer: Self-pay

## 2020-07-02 ENCOUNTER — Encounter (HOSPITAL_COMMUNITY): Payer: Self-pay | Admitting: Emergency Medicine

## 2020-07-02 ENCOUNTER — Inpatient Hospital Stay (HOSPITAL_COMMUNITY)
Admission: EM | Admit: 2020-07-02 | Discharge: 2020-07-05 | DRG: 640 | Disposition: A | Discharge status: Home Health | Payer: PPO | Attending: Internal Medicine | Admitting: Internal Medicine

## 2020-07-02 ENCOUNTER — Emergency Department (HOSPITAL_COMMUNITY): Payer: PPO

## 2020-07-02 DIAGNOSIS — H9190 Unspecified hearing loss, unspecified ear: Secondary | ICD-10-CM | POA: Diagnosis present

## 2020-07-02 DIAGNOSIS — Z87891 Personal history of nicotine dependence: Secondary | ICD-10-CM | POA: Diagnosis not present

## 2020-07-02 DIAGNOSIS — Z8249 Family history of ischemic heart disease and other diseases of the circulatory system: Secondary | ICD-10-CM | POA: Diagnosis not present

## 2020-07-02 DIAGNOSIS — Z20822 Contact with and (suspected) exposure to covid-19: Secondary | ICD-10-CM | POA: Diagnosis present

## 2020-07-02 DIAGNOSIS — I482 Chronic atrial fibrillation, unspecified: Secondary | ICD-10-CM | POA: Diagnosis present

## 2020-07-02 DIAGNOSIS — N183 Chronic kidney disease, stage 3 unspecified: Secondary | ICD-10-CM

## 2020-07-02 DIAGNOSIS — I2510 Atherosclerotic heart disease of native coronary artery without angina pectoris: Secondary | ICD-10-CM | POA: Diagnosis present

## 2020-07-02 DIAGNOSIS — I959 Hypotension, unspecified: Secondary | ICD-10-CM | POA: Diagnosis present

## 2020-07-02 DIAGNOSIS — Z79899 Other long term (current) drug therapy: Secondary | ICD-10-CM

## 2020-07-02 DIAGNOSIS — I255 Ischemic cardiomyopathy: Secondary | ICD-10-CM | POA: Diagnosis present

## 2020-07-02 DIAGNOSIS — K449 Diaphragmatic hernia without obstruction or gangrene: Secondary | ICD-10-CM | POA: Diagnosis present

## 2020-07-02 DIAGNOSIS — D631 Anemia in chronic kidney disease: Secondary | ICD-10-CM | POA: Diagnosis present

## 2020-07-02 DIAGNOSIS — R634 Abnormal weight loss: Secondary | ICD-10-CM | POA: Diagnosis present

## 2020-07-02 DIAGNOSIS — E785 Hyperlipidemia, unspecified: Secondary | ICD-10-CM | POA: Diagnosis present

## 2020-07-02 DIAGNOSIS — R627 Adult failure to thrive: Secondary | ICD-10-CM | POA: Diagnosis present

## 2020-07-02 DIAGNOSIS — I5042 Chronic combined systolic (congestive) and diastolic (congestive) heart failure: Secondary | ICD-10-CM | POA: Diagnosis present

## 2020-07-02 DIAGNOSIS — Z87442 Personal history of urinary calculi: Secondary | ICD-10-CM

## 2020-07-02 DIAGNOSIS — L03113 Cellulitis of right upper limb: Secondary | ICD-10-CM | POA: Insufficient documentation

## 2020-07-02 DIAGNOSIS — E860 Dehydration: Principal | ICD-10-CM | POA: Diagnosis present

## 2020-07-02 DIAGNOSIS — N1832 Chronic kidney disease, stage 3b: Secondary | ICD-10-CM | POA: Diagnosis present

## 2020-07-02 DIAGNOSIS — K3189 Other diseases of stomach and duodenum: Secondary | ICD-10-CM | POA: Diagnosis not present

## 2020-07-02 DIAGNOSIS — I252 Old myocardial infarction: Secondary | ICD-10-CM

## 2020-07-02 DIAGNOSIS — K2951 Unspecified chronic gastritis with bleeding: Secondary | ICD-10-CM | POA: Diagnosis present

## 2020-07-02 DIAGNOSIS — I442 Atrioventricular block, complete: Secondary | ICD-10-CM | POA: Diagnosis present

## 2020-07-02 DIAGNOSIS — Z950 Presence of cardiac pacemaker: Secondary | ICD-10-CM | POA: Diagnosis not present

## 2020-07-02 DIAGNOSIS — E871 Hypo-osmolality and hyponatremia: Secondary | ICD-10-CM | POA: Diagnosis present

## 2020-07-02 DIAGNOSIS — I4819 Other persistent atrial fibrillation: Secondary | ICD-10-CM | POA: Diagnosis not present

## 2020-07-02 DIAGNOSIS — I495 Sick sinus syndrome: Secondary | ICD-10-CM | POA: Diagnosis present

## 2020-07-02 DIAGNOSIS — K317 Polyp of stomach and duodenum: Secondary | ICD-10-CM | POA: Diagnosis present

## 2020-07-02 DIAGNOSIS — R197 Diarrhea, unspecified: Secondary | ICD-10-CM | POA: Diagnosis present

## 2020-07-02 DIAGNOSIS — D649 Anemia, unspecified: Secondary | ICD-10-CM | POA: Diagnosis not present

## 2020-07-02 DIAGNOSIS — Z6823 Body mass index (BMI) 23.0-23.9, adult: Secondary | ICD-10-CM | POA: Diagnosis not present

## 2020-07-02 DIAGNOSIS — R531 Weakness: Secondary | ICD-10-CM | POA: Diagnosis not present

## 2020-07-02 DIAGNOSIS — Z7901 Long term (current) use of anticoagulants: Secondary | ICD-10-CM

## 2020-07-02 HISTORY — DX: Personal history of urinary calculi: Z87.442

## 2020-07-02 LAB — LACTIC ACID, PLASMA
Lactic Acid, Venous: 1.4 mmol/L (ref 0.5–1.9)
Lactic Acid, Venous: 1.9 mmol/L (ref 0.5–1.9)

## 2020-07-02 LAB — COMPREHENSIVE METABOLIC PANEL
ALT: 15 U/L (ref 0–44)
AST: 22 U/L (ref 15–41)
Albumin: 3.7 g/dL (ref 3.5–5.0)
Alkaline Phosphatase: 39 U/L (ref 38–126)
Anion gap: 7 (ref 5–15)
BUN: 34 mg/dL — ABNORMAL HIGH (ref 8–23)
CO2: 25 mmol/L (ref 22–32)
Calcium: 8.7 mg/dL — ABNORMAL LOW (ref 8.9–10.3)
Chloride: 102 mmol/L (ref 98–111)
Creatinine, Ser: 1.88 mg/dL — ABNORMAL HIGH (ref 0.61–1.24)
GFR calc Af Amer: 34 mL/min — ABNORMAL LOW (ref >60)
GFR calc non Af Amer: 29 mL/min — ABNORMAL LOW (ref >60)
Glucose, Bld: 111 mg/dL — ABNORMAL HIGH (ref 70–99)
Potassium: 3.5 mmol/L (ref 3.5–5.1)
Sodium: 134 mmol/L — ABNORMAL LOW (ref 135–145)
Total Bilirubin: 0.7 mg/dL (ref 0.3–1.2)
Total Protein: 6.7 g/dL (ref 6.5–8.1)

## 2020-07-02 LAB — URINALYSIS, ROUTINE W REFLEX MICROSCOPIC
Bilirubin Urine: NEGATIVE
Glucose, UA: NEGATIVE mg/dL
Hgb urine dipstick: NEGATIVE
Ketones, ur: NEGATIVE mg/dL
Leukocytes,Ua: NEGATIVE
Nitrite: NEGATIVE
Protein, ur: NEGATIVE mg/dL
Specific Gravity, Urine: 1.004 — ABNORMAL LOW (ref 1.005–1.030)
pH: 5 (ref 5.0–8.0)

## 2020-07-02 LAB — CBC WITH DIFFERENTIAL/PLATELET
Abs Immature Granulocytes: 0.01 10*3/uL (ref 0.00–0.07)
Basophils Absolute: 0 10*3/uL (ref 0.0–0.1)
Basophils Relative: 0 %
Eosinophils Absolute: 0.1 10*3/uL (ref 0.0–0.5)
Eosinophils Relative: 1 %
HCT: 35.2 % — ABNORMAL LOW (ref 39.0–52.0)
Hemoglobin: 11.5 g/dL — ABNORMAL LOW (ref 13.0–17.0)
Immature Granulocytes: 0 %
Lymphocytes Relative: 31 %
Lymphs Abs: 2 10*3/uL (ref 0.7–4.0)
MCH: 30.7 pg (ref 26.0–34.0)
MCHC: 32.7 g/dL (ref 30.0–36.0)
MCV: 94.1 fL (ref 80.0–100.0)
Monocytes Absolute: 0.5 10*3/uL (ref 0.1–1.0)
Monocytes Relative: 7 %
Neutro Abs: 4 10*3/uL (ref 1.7–7.7)
Neutrophils Relative %: 61 %
Platelets: 154 10*3/uL (ref 150–400)
RBC: 3.74 MIL/uL — ABNORMAL LOW (ref 4.22–5.81)
RDW: 13.2 % (ref 11.5–15.5)
WBC: 6.5 10*3/uL (ref 4.0–10.5)
nRBC: 0 % (ref 0.0–0.2)

## 2020-07-02 LAB — FOLATE: Folate: 15.5 ng/mL (ref >5.9)

## 2020-07-02 LAB — VITAMIN B12: Vitamin B-12: 129 pg/mL — ABNORMAL LOW (ref 180–914)

## 2020-07-02 LAB — MRSA PCR SCREENING: MRSA by PCR: NEGATIVE

## 2020-07-02 LAB — SARS CORONAVIRUS 2 BY RT PCR (HOSPITAL ORDER, PERFORMED IN ~~LOC~~ HOSPITAL LAB): SARS Coronavirus 2: NEGATIVE

## 2020-07-02 LAB — OCCULT BLOOD X 1 CARD TO LAB, STOOL: Fecal Occult Bld: NEGATIVE

## 2020-07-02 LAB — PROCALCITONIN: Procalcitonin: <0.1 ng/mL

## 2020-07-02 LAB — TSH: TSH: 0.865 u[IU]/mL (ref 0.350–4.500)

## 2020-07-02 MED ORDER — POTASSIUM CHLORIDE IN NACL 20-0.9 MEQ/L-% IV SOLN: INTRAVENOUS | Status: AC

## 2020-07-02 MED ORDER — CEFAZOLIN SODIUM-DEXTROSE 1-4 GM/50ML-% IV SOLN
1.0000 g | Freq: Two times a day (BID) | INTRAVENOUS | Status: DC
  Administered 2020-07-02 – 2020-07-05 (×6): 1 g via INTRAVENOUS
  Filled 2020-07-02 (×11): qty 50

## 2020-07-02 MED ORDER — PANTOPRAZOLE SODIUM 40 MG IV SOLR
40.0000 mg | Freq: Two times a day (BID) | INTRAVENOUS | Status: DC
  Administered 2020-07-02 – 2020-07-04 (×5): 40 mg via INTRAVENOUS
  Filled 2020-07-02 (×6): qty 40

## 2020-07-02 MED ORDER — SIMVASTATIN 20 MG PO TABS
40.0000 mg | ORAL_TABLET | Freq: Every day | ORAL | Status: DC
  Administered 2020-07-02 – 2020-07-04 (×3): 40 mg via ORAL
  Filled 2020-07-02 (×3): qty 2

## 2020-07-02 MED ORDER — ONDANSETRON HCL 4 MG/2ML IJ SOLN: 4.0000 mg | Freq: Four times a day (QID) | INTRAMUSCULAR | Status: DC | PRN

## 2020-07-02 MED ORDER — SODIUM CHLORIDE 0.9 % IV BOLUS
500.0000 mL | Freq: Once | INTRAVENOUS | Status: AC
  Administered 2020-07-02: 500 mL via INTRAVENOUS

## 2020-07-02 MED ORDER — ACETAMINOPHEN 650 MG RE SUPP: 650.0000 mg | Freq: Four times a day (QID) | RECTAL | Status: DC | PRN

## 2020-07-02 MED ORDER — ONDANSETRON HCL 4 MG PO TABS: 4.0000 mg | ORAL_TABLET | Freq: Four times a day (QID) | ORAL | Status: DC | PRN

## 2020-07-02 MED ORDER — ACETAMINOPHEN 325 MG PO TABS: 650.0000 mg | ORAL_TABLET | Freq: Four times a day (QID) | ORAL | Status: DC | PRN

## 2020-07-02 MED ORDER — VITAMIN D 25 MCG (1000 UNIT) PO TABS
1000.0000 [IU] | ORAL_TABLET | Freq: Every day | ORAL | Status: DC
  Administered 2020-07-02 – 2020-07-04 (×3): 1000 [IU] via ORAL
  Filled 2020-07-02 (×4): qty 1

## 2020-07-02 NOTE — H&P (Signed)
History and Physical  [name]:[id number] DOB: 11-03-1922 DOA: 07/02/2020   PCP: Reynold Bowen, MD   Patient coming from: Home  Chief Complaint: generalized weakness  HPI:  Mark Lane is a 86 y.o. male with medical history of 86 year old male with a history of systolic and diastolic CHF, ischemic cardiomyopathy, coronary artery disease with STEMI 2011, chronic atrial fibrillation, complete heart block status post PPM February 2020, hyperlipidemia presenting with generalized weakness and failure to thrive symptoms for the past 10 to 11 days.  In reality, the patient began developing decreased oral intake for about 2 weeks prior to this admission.  Subsequently, he developed increasing generalized weakness requiring some assistance with ambulation secondary to unsteady gait.  His son also states that he did require some assistance getting out of bed initially, but that has improved.  However he continued to have some unsteady gait and dizziness with ambulation.  He has not had any fevers, chills, chest pain, shortness of breath, vomiting, abdominal pain, dysuria, hematuria, headache, visual disturbance.  Notably, the patient picked off the scab on the right volar surface of his right forearm about 1 week prior to this admission.  In the past 24 to 48 hours he has noted some redness in that area.  He denies any other rashes or synovitis.  There is not been any new medications.  The patient has had some loose stools for 5 days prior to this admission, but he states that that has been slowing down.  He has had some occasional melanotic stools without any hematochezia.  In addition, he has lost about 11 pounds in the past 4 months. The patient followed up with his gastroenterologist office on 06/29/2020.  Hemoccult was negative during that office visit.  Because he was complaining of diarrhea at that time, orders were placed for stool pathogen panel and repeat CBC.  He subsequently followed up  with Dr. Dr. Algernon Huxley in the CHF clinic on 06/30/2020.  At that time, his lisinopril was decreased to 5 mg daily and furosemide was decreased to 20 mg daily.  He was also noted that during these 2 office visits his systolic blood pressure was in the low 90s.  Because of the erythema in his right forearm and continued weakness, he presented to the emergency department for further evaluation. In the emergency department, the patient was afebrile with soft blood pressures in the low 90s and upper 80s.  Oxygen saturation was 99% on room air.  Sodium 134, potassium 3.5, chloride 102, CO2 25, BUN 34, creatinine 1.8.  LFTs were unremarkable.  WBC 6.5, hemoglobin 9.5, platelets 254,000.  Lactic acid was 1.9.  UA was negative for pyuria.  Chest x-ray was negative for any acute findings.  Assessment/Plan: Generalized weakness/failure to thrive/Dehydration -TSH/free T4 -Serum Q75 -Folic acid -A.m. cortisol -PT evaluation -Repeat echocardiogram  Hypotension -Lactic acid peaked 1.9 -Blood cultures x2 sets -UA negative for pyuria -Chest x-ray negative for infiltrates -Check procalcitonin -Suspect may be related to the patient's carvedilol, lisinopril, spironolactone, furosemide in the setting of failure to thrive and weight loss -Holding carvedilol, lisinopril, spironolactone, furosemide temporarily -Judicious IV fluids  CKD stage IIIb -Baseline creatinine 1.4-1.7 -Patient presented with serum creatinine 1.88  Melanotic stool -Patient had negative FOBT during his GI visit on 06/29/2020 -Repeat FOBT -GI consult -Start IV Protonix twice daily -hold apixaban temporarily  Loose stools -Stool pathogen panel  Right forearm cellulitis -Continue cefazolin  Hyponatremia -Secondary to volume depletion and poor solute intake -  Start judicious normal saline  Anemia of chronic disease -06/30/2020 iron saturation 24%, ferritin 231 -Baseline hemoglobin~12 -Presented with hemoglobin 11.5 -FOBT  Chronic  atrial fibrillation -hold apixaban temporarily  Ischemic cardiomyopathy/CAD -No chest pain presently -07/22/2019 echo EF 45-50%, impaired relaxation, mild anteroseptal HK  Complete heart block -Status post PPM February 2020  Hyperlipidemia -continue statin       Past Medical History:  Diagnosis Date  . Coronary artery disease   . Hyperlipidemia    Past Surgical History:  Procedure Laterality Date  . BIV PACEMAKER INSERTION CRT-P N/A 12/22/2018   Procedure: BIV PACEMAKER INSERTION CRT-P;  Surgeon: Evans Lance, MD;  Location: [facility];  Service: Cardiovascular;  Laterality: N/A;  . CARDIAC CATHETERIZATION    . HEMORRHOID SURGERY     removed a hemorrhoid in the 1970s  . KIDNEY STONE SURGERY     remove a kidney stone   Social History:  reports that he has quit smoking. He has never used smokeless tobacco. He reports that he does not drink alcohol and does not use drugs.   Family History  Problem Relation Age of Onset  . Heart attack Father        in his 42's  . Cancer Brother        1/2  . Cancer Sister   . Heart Problems Brother 45       pacemaker 2/2     No Known Allergies   Prior to Admission medications   Medication Sig Start Date End Date Taking? Authorizing Provider  apixaban (ELIQUIS) 2.5 MG TABS tablet Take 1 tablet (2.5 mg total) by mouth 2 (two) times daily. 06/30/20  Yes Larey Dresser, MD  carvedilol (COREG) 3.125 MG tablet TAKE  (1)  TABLET TWICE A DAY. 08/17/19  Yes Evans Lance, MD  cholecalciferol (VITAMIN D) 1000 UNITS tablet Take 1,000 Units by mouth daily. 2 tablets   Yes [provider]  furosemide (LASIX) 20 MG tablet TAKE 1 TABLET DAILY 07/01/20  Yes Larey Dresser, MD  lisinopril (ZESTRIL) 5 MG tablet Take 1 tablet (5 mg total) by mouth daily. 07/01/20  Yes Larey Dresser, MD  ondansetron (ZOFRAN ODT) 4 MG disintegrating tablet Take 1 tablet (4 mg total) by mouth every 8 (eight) hours as needed for nausea or  vomiting. 06/29/20  Yes Laurine Blazer A, PA-C  pantoprazole (PROTONIX) 40 MG tablet Take 1 tablet (40 mg total) by mouth daily. 06/29/20  Yes Minus Liberty, PA-C  simvastatin (ZOCOR) 40 MG tablet Take 1 tablet (40 mg total) by mouth at bedtime. 05/13/20  Yes Larey Dresser, MD  spironolactone (ALDACTONE) 25 MG tablet Take 12.5 mg by mouth daily.   Yes [provider]  furosemide (LASIX) 40 MG tablet Take 40mg  daily alternating with 20mg  every other day Patient not taking: Reported on 07/02/2020 03/11/20   Larey Dresser, MD    Review of Systems:  Constitutional:  No weight loss, night sweats, Fevers, chills, Head&Eyes: No headache.  No vision loss.  No eye pain or scotoma ENT:  No Difficulty swallowing,Tooth/dental problems,Sore throat,  No ear ache, post nasal drip,  Cardio-vascular:  No chest pain, Orthopnea, PND, swelling in lower extremities,  dizziness, palpitations  GI:  No  abdominal pain, nausea, vomiting, diarrhea, loss of appetite, hematochezia, melena, heartburn, indigestion, Resp:  No shortness of breath with exertion or at rest. No cough. No coughing up of blood .No wheezing.No chest wall deformity  Skin:  no  rash or lesions.  GU:  no dysuria, change in color of urine, no urgency or frequency. No flank pain.  Musculoskeletal:  No joint pain or swelling. No decreased range of motion. No back pain.  Psych:  No change in mood or affect. No depression or anxiety. Neurologic: No headache, no dysesthesia, no focal weakness, no vision loss. No syncope  Physical Exam: Vitals:   07/02/20 1330 07/02/20 1400 07/02/20 1430 07/02/20 1500  BP: (!) 84/54 (!) 86/55 (!) 91/57 (!) 94/58  Pulse: 60 79 60 60  Resp: 12 17 13 13   Temp:      TempSrc:      SpO2: 99% (!) 81% 99% 100%  Weight:      Height:       General:  A&O x 3, NAD, nontoxic, pleasant/cooperative Head/Eye: No conjunctival hemorrhage, no icterus, [place]/AT, No nystagmus ENT:  No icterus,  No thrush, good  dentition, no pharyngeal exudate Neck:  No masses, no lymphadenpathy, no bruits CV:  RRR, no rub, no gallop, no S3 Lung: Bibasilar crackles but no wheezing.  Good air movement Abdomen: soft/NT, +BS, nondistended, no peritoneal signs Ext: No cyanosis, No rashes, No petechiae, No lymphangitis, No edema Neuro: CNII-XII intact, strength 4/5 in bilateral upper and lower extremities, no dysmetria  Labs on Admission:  Basic Metabolic Panel: Recent Labs  Lab 06/27/20 1225 06/27/20 1225 06/29/20 1149 07/02/20 1211  NA 136  --  136 134*  K 3.9   < > 3.7 3.5  CL 101  --  100 102  CO2 24  --  27 25  GLUCOSE 127*  --  102 111*  BUN 26*  --  28* 34*  CREATININE 1.85*  --  1.64* 1.88*  CALCIUM 9.4  --  9.0 8.7*   < > = values in this interval not displayed.   Liver Function Tests: Recent Labs  Lab 06/27/20 1225 06/29/20 1149 07/02/20 1211  AST 31 26 22   ALT 17 13 15   ALKPHOS 39  --  39  BILITOT 0.7 0.5 0.7  PROT 7.1 6.9 6.7  ALBUMIN 3.8  --  3.7   No results for input(s): LIPASE, AMYLASE in the last 168 hours. No results for input(s): AMMONIA in the last 168 hours. CBC: Recent Labs  Lab 06/27/20 1225 06/29/20 1149 07/02/20 1211  WBC 3.3* 5.5 6.5  NEUTROABS  --  3,256 4.0  HGB 12.2* 12.8* 11.5*  HCT 38.3* 39.1 35.2*  MCV 94.1 93.8 94.1  PLT 148* 157 154   Coagulation Profile: No results for input(s): INR, PROTIME in the last 168 hours. Cardiac Enzymes: No results for input(s): CKTOTAL, CKMB, CKMBINDEX, TROPONINI in the last 168 hours. BNP: Invalid input(s): POCBNP CBG: No results for input(s): GLUCAP in the last 168 hours. Urine analysis:    Component Value Date/Time   COLORURINE STRAW (A) 07/02/2020 1253   APPEARANCEUR CLEAR 07/02/2020 1253   LABSPEC 1.004 (L) 07/02/2020 1253   PHURINE 5.0 07/02/2020 1253   GLUCOSEU NEGATIVE 07/02/2020 1253   HGBUR NEGATIVE 07/02/2020 1253   BILIRUBINUR NEGATIVE 07/02/2020 1253   KETONESUR NEGATIVE 07/02/2020 1253   PROTEINUR  NEGATIVE 07/02/2020 1253   UROBILINOGEN 0.2 02/14/2014 1721   NITRITE NEGATIVE 07/02/2020 1253   LEUKOCYTESUR NEGATIVE 07/02/2020 1253   Sepsis Labs: @LABRCNTIP (procalcitonin:4,lacticidven:4) ) Recent Results (from the past 240 hour(s))  SARS Coronavirus 2 by RT PCR (hospital order, performed in [place] hospital lab) Nasopharyngeal Nasopharyngeal Swab     Status: None   Collection Time: 07/02/20  2:12 PM   Specimen: Nasopharyngeal Swab  Result Value Ref Range Status   SARS Coronavirus 2 NEGATIVE NEGATIVE Final    Comment: (NOTE) SARS-CoV-2 target nucleic acids are NOT DETECTED.  The SARS-CoV-2 RNA is generally detectable in upper and lower respiratory specimens during the acute phase of infection. The lowest concentration of SARS-CoV-2 viral copies this assay can detect is 250 copies / mL. A negative result does not preclude SARS-CoV-2 infection and should not be used as the sole basis for treatment or other patient management decisions.  A negative result may occur with improper specimen collection / handling, submission of specimen other than nasopharyngeal swab, presence of viral mutation(s) within the areas targeted by this assay, and inadequate number of viral copies (<250 copies / mL). A negative result must be combined with clinical observations, patient history, and epidemiological information.  Fact Sheet for Patients:   StrictlyIdeas.no  Fact Sheet for Healthcare Providers: BankingDealers.co.za  This test is not yet approved or  cleared by the [place] FDA and has been authorized for detection and/or diagnosis of SARS-CoV-2 by FDA under an Emergency Use Authorization (EUA).  This EUA will remain in effect (meaning this test can be used) for the duration of the COVID-19 declaration under Section 564(b)(1) of the Act, 21 U.S.C. section 360bbb-3(b)(1), unless the authorization is terminated or revoked  sooner.  Performed at [facility], [address], [place], [place] [address]      Radiological Exams on Admission: DG Chest Portable 1 View  Result Date: 07/02/2020 CLINICAL DATA:  Weakness. EXAM: PORTABLE CHEST 1 VIEW COMPARISON:  None. FINDINGS: A 3 lead pacemaker is noted. Stable cardiomediastinal silhouette. No pneumothorax. No nodules or masses. No focal infiltrates. IMPRESSION: No active disease. Electronically Signed   By: Dorise Bullion III M.D   On: 07/02/2020 13:11    EKG: Independently reviewed. Paced afib    Time spent:60 minutes Code Status:   FULL Family Communication:  Son updated at bedside 8/28 Disposition Plan: expect 1-2 day hospitalization Consults called: GI DVT Prophylaxis:apixaban  Orson Eva, DO  [facility] Pager [phone]  If 7PM-7AM, please contact night-coverage [email or web address] Password [facility] 07/02/2020, 3:36 PM

## 2020-07-02 NOTE — ED Triage Notes (Signed)
Weakness started on Aug 18t h.  Increasing over last ten days.  Seen GI 25 th and Cardiology on 26 th of this month.  GI notice increasing weight loss.  Diarrhea on Aug 18 th times five days but resolved.  BP 90/48, son reports BP med cut in half on yesterday by Dr Aundra Dubin.  Denies SOB or Covid symptoms.  Pt does have red area to right arm (outlined by family).  Redness is not outside of line.  C./o feeling sore.  Notice the area to right arm 4-5 days ago, small scab.  Redness notice on last night about 6 cm.  Healing scab noted.  Non-skid sock given to pt.

## 2020-07-02 NOTE — ED Notes (Signed)
Given peanut butter crackers and soda.

## 2020-07-02 NOTE — Progress Notes (Signed)
Noted trending down of pt blood pressure. Paged [facility] awaiting response at this time.Patient is currently asymptomatic. Patient currently alert and oriented per baseline. No complaints of dizziness.

## 2020-07-02 NOTE — ED Provider Notes (Signed)
[facility] EMERGENCY DEPARTMENT Provider Note   CSN: [id number] Arrival date & time: 07/02/20  1024     History Chief Complaint  Patient presents with  . Weakness    times 10 days    Mark Lane is a 86 y.o. male.  HPI Patient presents with generalized weakness.  Has been going for around the last 10 days.  Has been seen as an outpatient by cardiology and GI.  Guaiac negative although initially had had black stools.  Had had some diarrhea around 10 days ago but resolved.  Patient has been hypotensive.  Seen yesterday by CHF clinic.  They decreased some of his medications.  No fevers.  No cough.  Does have a somewhat decreased oral intake.  Has new right forearm erythema.  Has been there for around half of the last 10 days.  States blood work was drawn but not at the site.  No dysuria.  With cardiology there was some question of Takotsubo's cardiomyopathy.  Echocardiogram ordered but not done yet.  Patient is hard of hearing and most of the history comes from his son.    Past Medical History:  Diagnosis Date  . Coronary artery disease   . Hyperlipidemia     Patient Active Problem List   Diagnosis Date Noted  . Biventricular cardiac pacemaker in situ 07/22/2019  . Persistent atrial fibrillation ([place]) 07/22/2019  . Complete heart block ([place]) 12/22/2018  . Cardiomyopathy, ischemic 12/30/2012  . ACUT MI OTH LAT WALL SUBSQT EPIS CARE 05/29/2010  . 1st degree AV block 02/23/2009  . Hyperlipidemia 02/22/2009  . Coronary atherosclerosis 02/22/2009    Past Surgical History:  Procedure Laterality Date  . BIV PACEMAKER INSERTION CRT-P N/A 12/22/2018   Procedure: BIV PACEMAKER INSERTION CRT-P;  Surgeon: Evans Lance, MD;  Location: [place] [facility];  Service: Cardiovascular;  Laterality: N/A;  . CARDIAC CATHETERIZATION    . HEMORRHOID SURGERY     removed a hemorrhoid in the 1970s  . KIDNEY STONE SURGERY     remove a kidney stone       Family History  Problem Relation  Age of Onset  . Heart attack Father        in his 76's  . Cancer Brother        1/2  . Cancer Sister   . Heart Problems Brother 24       pacemaker 2/2    Social History   Tobacco Use  . Smoking status: Former Research scientist (life sciences)  . Smokeless tobacco: Never Used  Substance Use Topics  . Alcohol use: No  . Drug use: No    Home Medications Prior to Admission medications   Medication Sig Start Date End Date Taking? Authorizing Provider  apixaban (ELIQUIS) 2.5 MG TABS tablet Take 1 tablet (2.5 mg total) by mouth 2 (two) times daily. 06/30/20  Yes Larey Dresser, MD  carvedilol (COREG) 3.125 MG tablet TAKE  (1)  TABLET TWICE A DAY. 08/17/19  Yes Evans Lance, MD  cholecalciferol (VITAMIN D) 1000 UNITS tablet Take 1,000 Units by mouth daily. 2 tablets   Yes [provider]  furosemide (LASIX) 20 MG tablet TAKE 1 TABLET DAILY 07/01/20  Yes Larey Dresser, MD  lisinopril (ZESTRIL) 5 MG tablet Take 1 tablet (5 mg total) by mouth daily. 07/01/20  Yes Larey Dresser, MD  ondansetron (ZOFRAN ODT) 4 MG disintegrating tablet Take 1 tablet (4 mg total) by mouth every 8 (eight) hours as needed for nausea or  vomiting. 06/29/20  Yes Laurine Blazer A, PA-C  pantoprazole (PROTONIX) 40 MG tablet Take 1 tablet (40 mg total) by mouth daily. 06/29/20  Yes Minus Liberty, PA-C  simvastatin (ZOCOR) 40 MG tablet Take 1 tablet (40 mg total) by mouth at bedtime. 05/13/20  Yes Larey Dresser, MD  spironolactone (ALDACTONE) 25 MG tablet Take 12.5 mg by mouth daily.   Yes [provider]  furosemide (LASIX) 40 MG tablet Take 40mg  daily alternating with 20mg  every other day Patient not taking: Reported on 07/02/2020 03/11/20   Larey Dresser, MD    Allergies    Patient has no known allergies.  Review of Systems   Review of Systems  Constitutional: Positive for appetite change and fatigue.  HENT: Negative for congestion.   Respiratory: Negative for shortness of breath.   Gastrointestinal:  Positive for diarrhea. Negative for abdominal pain.  Genitourinary: Negative for flank pain.  Musculoskeletal: Negative for back pain.  Skin: Positive for wound.  Neurological: Positive for weakness.  Psychiatric/Behavioral: Negative for confusion.    Physical Exam Updated Vital Signs BP (!) 86/55   Pulse 79   Temp 98.4 F (36.9 C) (Oral)   Resp 17   Ht 5\' 5"  (1.651 m)   Wt 64.4 kg   SpO2 (!) 81%   BMI 23.63 kg/m   Physical Exam Vitals reviewed.  Constitutional:      Appearance: Normal appearance.  HENT:     Head: Atraumatic.  Eyes:     Pupils: Pupils are equal, round, and reactive to light.  Cardiovascular:     Rate and Rhythm: Regular rhythm.  Pulmonary:     Breath sounds: No wheezing or rhonchi.  Abdominal:     Tenderness: There is no abdominal tenderness.  Musculoskeletal:     Comments: Erythematous area right anterior forearm.  No induration.  No warmth.  Skin:    General: Skin is warm.     Capillary Refill: Capillary refill takes less than 2 seconds.     Coloration: Skin is pale.  Neurological:     Mental Status: He is oriented to person, place, and time.     ED Results / Procedures / Treatments   Labs (all labs ordered are listed, but only abnormal results are displayed) Labs Reviewed  COMPREHENSIVE METABOLIC PANEL - Abnormal; Notable for the following components:      Result Value   Sodium 134 (*)    Glucose, Bld 111 (*)    BUN 34 (*)    Creatinine, Ser 1.88 (*)    Calcium 8.7 (*)    GFR calc non Af Amer 29 (*)    GFR calc Af Amer 34 (*)    All other components within normal limits  CBC WITH DIFFERENTIAL/PLATELET - Abnormal; Notable for the following components:   RBC 3.74 (*)    Hemoglobin 11.5 (*)    HCT 35.2 (*)    All other components within normal limits  URINALYSIS, ROUTINE W REFLEX MICROSCOPIC - Abnormal; Notable for the following components:   Color, Urine STRAW (*)    Specific Gravity, Urine 1.004 (*)    All other components within  normal limits  CULTURE, BLOOD (ROUTINE X 2)  CULTURE, BLOOD (ROUTINE X 2)  SARS CORONAVIRUS 2 BY RT PCR (HOSPITAL ORDER, [place] [facility])  LACTIC ACID, PLASMA  LACTIC ACID, PLASMA    EKG EKG Interpretation  Date/Time:  Saturday July 02 2020 11:30:43 EDT Ventricular Rate:  64 PR Interval:  QRS Duration: 144 QT Interval:  507 QTC Calculation: 524 R Axis:   93 Text Interpretation: paced with afib Confirmed by Davonna Belling [phone]) on 07/02/2020 2:19:11 PM   Radiology DG Chest Portable 1 View  Result Date: 07/02/2020 CLINICAL DATA:  Weakness. EXAM: PORTABLE CHEST 1 VIEW COMPARISON:  None. FINDINGS: A 3 lead pacemaker is noted. Stable cardiomediastinal silhouette. No pneumothorax. No nodules or masses. No focal infiltrates. IMPRESSION: No active disease. Electronically Signed   By: Dorise Bullion III M.D   On: 07/02/2020 13:11    Procedures Procedures (including critical care time)  Medications Ordered in ED Medications  sodium chloride 0.9 % bolus 500 mL (500 mLs Intravenous New Bag/Given 07/02/20 1421)    ED Course  I have reviewed the triage vital signs and the nursing notes.  Pertinent labs & imaging results that were available during my care of the patient were reviewed by me and considered in my medical decision making (see chart for details).    MDM Rules/Calculators/A&P                          Patient with hypotension and fatigue.  Recent normal TSH.  Hemoglobin overall stable.  Creatinine mildly elevated but near baseline may be a little higher.  Seen by cardiology 2 days ago and had change in medications.  However still hypotension.  Normal lactic acid normal white count without fever.  Has had Covid vaccines but Covid testing still pending.  Chest x-ray reassuring.  Urine does not show infection.  However does have small area of potential cellulitis on the right forearm.  I doubt this is the cause of severe hypotension and do not think  this is a severe sepsis.  However with age and comorbidities that feel the patient benefit for admission to the hospital to further evaluate the hypotension.  Fluid bolus will be judiciously given now due to CHF history.  CRITICAL CARE Performed by: Davonna Belling Total critical care time: 30 minutes Critical care time was exclusive of separately billable procedures and treating other patients. Critical care was necessary to treat or prevent imminent or life-threatening deterioration. Critical care was time spent personally by me on the following activities: development of treatment plan with patient and/or surrogate as well as nursing, discussions with consultants, evaluation of patient's response to treatment, examination of patient, obtaining history from patient or surrogate, ordering and performing treatments and interventions, ordering and review of laboratory studies, ordering and review of radiographic studies, pulse oximetry and re-evaluation of patient's condition.  Final Clinical Impression(s) / ED Diagnoses Final diagnoses:  Hypotension, unspecified hypotension type  Right forearm cellulitis    Rx / DC Orders ED Discharge Orders    None       Davonna Belling, MD 07/02/20 1423

## 2020-07-02 NOTE — ED Notes (Signed)
EDP at bedside  

## 2020-07-03 ENCOUNTER — Inpatient Hospital Stay (HOSPITAL_COMMUNITY): Payer: PPO

## 2020-07-03 DIAGNOSIS — I255 Ischemic cardiomyopathy: Secondary | ICD-10-CM

## 2020-07-03 LAB — MAGNESIUM: Magnesium: 2 mg/dL (ref 1.7–2.4)

## 2020-07-03 LAB — ECHOCARDIOGRAM COMPLETE
Area-P 1/2: 2.94 cm2
Height: 65 in
S' Lateral: 3.76 cm
Weight: 2225.76 oz

## 2020-07-03 LAB — CBC
HCT: 32.9 % — ABNORMAL LOW (ref 39.0–52.0)
Hemoglobin: 10.6 g/dL — ABNORMAL LOW (ref 13.0–17.0)
MCH: 30.5 pg (ref 26.0–34.0)
MCHC: 32.2 g/dL (ref 30.0–36.0)
MCV: 94.8 fL (ref 80.0–100.0)
Platelets: 149 10*3/uL — ABNORMAL LOW (ref 150–400)
RBC: 3.47 MIL/uL — ABNORMAL LOW (ref 4.22–5.81)
RDW: 13.2 % (ref 11.5–15.5)
WBC: 5.4 10*3/uL (ref 4.0–10.5)
nRBC: 0 % (ref 0.0–0.2)

## 2020-07-03 LAB — BASIC METABOLIC PANEL
Anion gap: 6 (ref 5–15)
BUN: 28 mg/dL — ABNORMAL HIGH (ref 8–23)
CO2: 27 mmol/L (ref 22–32)
Calcium: 8.3 mg/dL — ABNORMAL LOW (ref 8.9–10.3)
Chloride: 106 mmol/L (ref 98–111)
Creatinine, Ser: 1.62 mg/dL — ABNORMAL HIGH (ref 0.61–1.24)
GFR calc Af Amer: 41 mL/min — ABNORMAL LOW (ref >60)
GFR calc non Af Amer: 35 mL/min — ABNORMAL LOW (ref >60)
Glucose, Bld: 103 mg/dL — ABNORMAL HIGH (ref 70–99)
Potassium: 4 mmol/L (ref 3.5–5.1)
Sodium: 139 mmol/L (ref 135–145)

## 2020-07-03 LAB — T4, FREE: Free T4: 0.92 ng/dL (ref 0.61–1.12)

## 2020-07-03 LAB — CORTISOL-AM, BLOOD: Cortisol - AM: 6.2 ug/dL — ABNORMAL LOW (ref 6.7–22.6)

## 2020-07-03 MED ORDER — CYANOCOBALAMIN 1000 MCG/ML IJ SOLN
1000.0000 ug | Freq: Once | INTRAMUSCULAR | Status: AC
  Administered 2020-07-03: 1000 ug via INTRAMUSCULAR
  Filled 2020-07-03: qty 1

## 2020-07-03 MED ORDER — CARVEDILOL 3.125 MG PO TABS
3.1250 mg | ORAL_TABLET | Freq: Two times a day (BID) | ORAL | Status: DC
  Administered 2020-07-03 – 2020-07-04 (×3): 3.125 mg via ORAL
  Filled 2020-07-03 (×4): qty 1

## 2020-07-03 MED ORDER — VITAMIN B-12 100 MCG PO TABS
500.0000 ug | ORAL_TABLET | Freq: Every day | ORAL | Status: DC
  Administered 2020-07-04: 500 ug via ORAL
  Filled 2020-07-03 (×2): qty 5

## 2020-07-03 MED ORDER — COSYNTROPIN 0.25 MG IJ SOLR
0.2500 mg | Freq: Once | INTRAMUSCULAR | Status: AC
  Administered 2020-07-04: 0.25 mg via INTRAVENOUS
  Filled 2020-07-03 (×2): qty 0.25

## 2020-07-03 NOTE — TOC Progression Note (Signed)
Transition of Care [facility]) - Progression Note    Patient Details  Name: [name] MRN: [id number] Date of Birth: [birth year]  [facility] CM/SW Contact  Natasha Bence, LCSW Phone Number: 07/03/2020, 3:30 PM  Clinical Narrative:    CSW received referral for HHPT for patient. Patient was able to ambulate 125 ft without assistance per pt evaluation. Patient declined HHPT, but reported they would be willing to revisit if physical mobility declines. TOC to follow.         Expected Discharge Plan and Services                                                 Social Determinants of Health (SDOH) Interventions    Readmission Risk Interventions No flowsheet data found.

## 2020-07-03 NOTE — Progress Notes (Addendum)
PROGRESS NOTE  Mark Lane GGE:[id number] DOB: 12/12/1921 DOA: 07/02/2020 PCP: Reynold Bowen, MD  Brief History:  86 y.o. male with medical history of 86 year old male with a history of systolic and diastolic CHF, ischemic cardiomyopathy, coronary artery disease with STEMI 2011, chronic atrial fibrillation, complete heart block status post PPM February 2020, hyperlipidemia presenting with generalized weakness and failure to thrive symptoms for the past 10 to 11 days.  In reality, the patient began developing decreased oral intake for about 2 weeks prior to this admission.  Subsequently, he developed increasing generalized weakness requiring some assistance with ambulation secondary to unsteady gait.  His son also states that he did require some assistance getting out of bed initially, but that has improved.  However he continued to have some unsteady gait and dizziness with ambulation.  He has not had any fevers, chills, chest pain, shortness of breath, vomiting, abdominal pain, dysuria, hematuria, headache, visual disturbance.  Notably, the patient picked off the scab on the right volar surface of his right forearm about 1 week prior to this admission.  In the past 24 to 48 hours he has noted some redness in that area.  He denies any other rashes or synovitis.  There is not been any new medications.  The patient has had some loose stools for 5 days prior to this admission, but he states that that has been slowing down.  He has had some occasional melanotic stools without any hematochezia.  In addition, he has lost about 11 pounds in the past 4 months. The patient followed up with his gastroenterologist office on 06/29/2020.  Hemoccult was negative during that office visit.  Because he was complaining of diarrhea at that time, orders were placed for stool pathogen panel and repeat CBC.  He subsequently followed up with Dr. Dr. Algernon Huxley in the CHF clinic on 06/30/2020.  At that time, his lisinopril  was decreased to 5 mg daily and furosemide was decreased to 20 mg daily.  He was also noted that during these 2 office visits his systolic blood pressure was in the low 90s.  Because of the erythema in his right forearm and continued weakness, he presented to the emergency department for further evaluation. In the emergency department, the patient was afebrile with soft blood pressures in the low 90s and upper 80s.  Oxygen saturation was 99% on room air.  Sodium 134, potassium 3.5, chloride 102, CO2 25, BUN 34, creatinine 1.8.  LFTs were unremarkable.  WBC 6.5, hemoglobin 9.5, platelets 254,000.  Lactic acid was 1.9.  UA was negative for pyuria.  Chest x-ray was negative for any acute findings.  Assessment/Plan:  Generalized weakness/failure to thrive/Dehydration -TSH--0.865 -Serum Y65-->035-->WSFKCLE -Folic XNTZ--00.1 -A.m. cortisol--pending -PT evaluation--pending -Repeat echocardiogram--EF 50%, G2DD, trivial /TRMR/AI  Hypotension -likely multifactorial including dehydration, cardiac meds, and FTT -Lactic acid peaked 1.9 -Blood cultures x2 sets--neg to date -UA negative for pyuria -Chest x-ray negative for infiltrates -Check procalcitonin < 0.10 -Suspect may be partly related to the patient's carvedilol, lisinopril, spironolactone, furosemide in the setting of failure to thrive and weight loss -Holding carvedilol, lisinopril, spironolactone, furosemide temporarily -Judicious IV fluids x 12 hours  Chronic systolic and diastolic CHF -remains clinically euvolemic -07/03/20 Echo--EF 50%, G2DD, trivial /TRMR/AI, normal PASP  CKD stage IIIb -Baseline creatinine 1.4-1.7 -Patient presented with serum creatinine 1.88 -improving with IVF  Melanotic stool -Patient had negative FOBT during his GI visit on 06/29/2020 -8/25 and 8/28 Repeat FOBT -GI  consult -Continue IV Protonix twice daily -hold apixaban temporarily -drop in Hgb last 24 hours partly dilution  Loose stools -Stool  pathogen panel  Right forearm cellulitis -Continue cefazolin  Hyponatremia -Secondary to volume depletion and poor solute intake -Start judicious normal saline-->improving  Anemia of chronic disease -06/30/2020 iron saturation 24%, ferritin 231 -Baseline hemoglobin~12 -Presented with hemoglobin 11.5 -8/25 and 8/28 FOBT--neg  Chronic atrial fibrillation -hold apixaban temporarily  Ischemic cardiomyopathy/CAD -No chest pain presently -07/22/2019 echo EF 45-50%, impaired relaxation, mild anteroseptal HK  Complete heart block -Status post PPM February 2020  Hyperlipidemia -continue statin      Status is: Inpatient  Remains inpatient appropriate because:Hemodynamically unstable   Dispo: The patient is from: Home              Anticipated d/c is to: Home              Anticipated d/c date is: 1 day              Patient currently is not medically stable to d/c.        Family Communication:  Son updated 8/29  Consultants:  GI  Code Status:  FULL   DVT Prophylaxis:  SCDs   Procedures: As Listed in Progress Note Above  Antibiotics: None      Subjective: Patient denies fevers, chills, headache, chest pain, dyspnea, nausea, vomiting, diarrhea, abdominal pain, dysuria, hematuria, hematochezia, and melena. He is feeling a little better, but remains weak.  No diarrhea.  BM more firm in last 24 hours  Objective: Vitals:   07/03/20 0600 07/03/20 0630 07/03/20 0700 07/03/20 0800  BP: (!) 107/46 (!) 101/50 (!) 111/54 (!) 90/42  Pulse: 60 61 60 60  Resp: 13 12 12 14   Temp:    97.8 F (36.6 C)  TempSrc:    Oral  SpO2: 96% 95% 97% 98%  Weight:      Height:        Intake/Output Summary (Last 24 hours) at 07/03/2020 1055 Last data filed at 07/03/2020 0610 Gross per 24 hour  Intake 1271.12 ml  Output 250 ml  Net 1021.12 ml   Weight change:  Exam:   General:  Pt is alert, follows commands appropriately, not in acute distress  HEENT: No  icterus, No thrush, No neck mass, [place]/AT  Cardiovascular: RRR, S1/S2, no rubs, no gallops  Respiratory: fine bibasilar crackles. No wheeze  Abdomen: Soft/+BS, non tender, non distended, no guarding  Extremities: No edema, No lymphangitis, No petechiae, No rashes, no synovitis   Data Reviewed: I have personally reviewed following labs and imaging studies Basic Metabolic Panel: Recent Labs  Lab 06/27/20 1225 06/29/20 1149 07/02/20 1211 07/03/20 0620  NA 136 136 134* 139  K 3.9 3.7 3.5 4.0  CL 101 100 102 106  CO2 24 27 25 27   GLUCOSE 127* 102 111* 103*  BUN 26* 28* 34* 28*  CREATININE 1.85* 1.64* 1.88* 1.62*  CALCIUM 9.4 9.0 8.7* 8.3*  MG  --   --   --  2.0   Liver Function Tests: Recent Labs  Lab 06/27/20 1225 06/29/20 1149 07/02/20 1211  AST 31 26 22   ALT 17 13 15   ALKPHOS 39  --  39  BILITOT 0.7 0.5 0.7  PROT 7.1 6.9 6.7  ALBUMIN 3.8  --  3.7   No results for input(s): LIPASE, AMYLASE in the last 168 hours. No results for input(s): AMMONIA in the last 168 hours. Coagulation Profile: No results for input(s): INR,  PROTIME in the last 168 hours. CBC: Recent Labs  Lab 06/27/20 1225 06/29/20 1149 07/02/20 1211 07/03/20 0620  WBC 3.3* 5.5 6.5 5.4  NEUTROABS  --  3,256 4.0  --   HGB 12.2* 12.8* 11.5* 10.6*  HCT 38.3* 39.1 35.2* 32.9*  MCV 94.1 93.8 94.1 94.8  PLT 148* 157 154 149*   Cardiac Enzymes: No results for input(s): CKTOTAL, CKMB, CKMBINDEX, TROPONINI in the last 168 hours. BNP: Invalid input(s): POCBNP CBG: No results for input(s): GLUCAP in the last 168 hours. HbA1C: No results for input(s): HGBA1C in the last 72 hours. Urine analysis:    Component Value Date/Time   COLORURINE STRAW (A) 07/02/2020 1253   APPEARANCEUR CLEAR 07/02/2020 1253   LABSPEC 1.004 (L) 07/02/2020 1253   PHURINE 5.0 07/02/2020 1253   GLUCOSEU NEGATIVE 07/02/2020 1253   HGBUR NEGATIVE 07/02/2020 1253   Hypoluxo 07/02/2020 1253   KETONESUR NEGATIVE  07/02/2020 1253   PROTEINUR NEGATIVE 07/02/2020 1253   UROBILINOGEN 0.2 02/14/2014 1721   NITRITE NEGATIVE 07/02/2020 1253   LEUKOCYTESUR NEGATIVE 07/02/2020 1253   Sepsis Labs: @LABRCNTIP (procalcitonin:4,lacticidven:4) ) Recent Results (from the past 240 hour(s))  SARS Coronavirus 2 by RT PCR (hospital order, performed in [place] hospital lab) Nasopharyngeal Nasopharyngeal Swab     Status: None   Collection Time: 07/02/20  2:12 PM   Specimen: Nasopharyngeal Swab  Result Value Ref Range Status   SARS Coronavirus 2 NEGATIVE NEGATIVE Final    Comment: (NOTE) SARS-CoV-2 target nucleic acids are NOT DETECTED.  The SARS-CoV-2 RNA is generally detectable in upper and lower respiratory specimens during the acute phase of infection. The lowest concentration of SARS-CoV-2 viral copies this assay can detect is 250 copies / mL. A negative result does not preclude SARS-CoV-2 infection and should not be used as the sole basis for treatment or other patient management decisions.  A negative result may occur with improper specimen collection / handling, submission of specimen other than nasopharyngeal swab, presence of viral mutation(s) within the areas targeted by this assay, and inadequate number of viral copies (<250 copies / mL). A negative result must be combined with clinical observations, patient history, and epidemiological information.  Fact Sheet for Patients:   StrictlyIdeas.no  Fact Sheet for Healthcare Providers: BankingDealers.co.za  This test is not yet approved or  cleared by the [place] FDA and has been authorized for detection and/or diagnosis of SARS-CoV-2 by FDA under an Emergency Use Authorization (EUA).  This EUA will remain in effect (meaning this test can be used) for the duration of the COVID-19 declaration under Section 564(b)(1) of the Act, 21 U.S.C. section 360bbb-3(b)(1), unless the authorization is  terminated or revoked sooner.  Performed at [facility], [address] [place], [place] [address]   Culture, blood (routine x 2)     Status: None (Preliminary result)   Collection Time: 07/02/20  2:38 PM   Specimen: BLOOD  Result Value Ref Range Status   Specimen Description BLOOD  Final   Special Requests NONE  Final   Culture   Final    NO GROWTH < 24 HOURS Performed at [facility], [address], [place], [place] [address]    Report Status PENDING  Incomplete  Culture, blood (routine x 2)     Status: None (Preliminary result)   Collection Time: 07/02/20  2:38 PM   Specimen: BLOOD  Result Value Ref Range Status   Specimen Description BLOOD  Final   Special Requests NONE  Final  Culture   Final    NO GROWTH < 24 HOURS Performed at [facility], [address], [place], [place] [address]    Report Status PENDING  Incomplete  MRSA PCR Screening     Status: None   Collection Time: 07/02/20  6:31 PM   Specimen: Nasal Mucosa; Nasopharyngeal  Result Value Ref Range Status   MRSA by PCR NEGATIVE NEGATIVE Final    Comment:        The GeneXpert MRSA Assay (FDA approved for NASAL specimens only), is one component of a comprehensive MRSA colonization surveillance program. It is not intended to diagnose MRSA infection nor to guide or monitor treatment for MRSA infections. Performed at [facility], [address], [place],  [address]      Scheduled Meds: . cholecalciferol  1,000 Units Oral Daily  . pantoprazole (PROTONIX) IV  40 mg Intravenous Q12H  . simvastatin  40 mg Oral QHS   Continuous Infusions: . 0.9 % NaCl with KCl 20 mEq / L 75 mL/hr at 07/03/20 0737  .  ceFAZolin (ANCEF) IV 1 g (07/03/20 0536)    Procedures/Studies: DG Chest Portable 1 View  Result Date: 07/02/2020 CLINICAL DATA:  Weakness. EXAM: PORTABLE CHEST 1 VIEW COMPARISON:  None. FINDINGS: A 3 lead pacemaker is noted. Stable cardiomediastinal silhouette. No pneumothorax. No nodules or masses.  No focal infiltrates. IMPRESSION: No active disease. Electronically Signed   By: Dorise Bullion III M.D   On: 07/02/2020 13:11   CUP PACEART REMOTE DEVICE CHECK  Result Date: 07/01/2020 Scheduled remote reviewed. Normal device function.  Known permanent atrial fib, on [place].  Good ventricular rate control Next remote 91 days. Kathy Breach, RN, CCDS, [employer]   Orson Eva, DO  [facility]  If 7PM-7AM, please contact night-coverage [email or web address] Password [email or web address] 07/03/2020, 10:55 AM   LOS: 1 day

## 2020-07-03 NOTE — Plan of Care (Signed)
  Problem: Acute Rehab PT Goals(only PT should resolve) Goal: Pt Will Go Supine/Side To Sit Outcome: Progressing Flowsheets (Taken 07/03/2020 1141) Pt will go Supine/Side to Sit: Independently Goal: Patient Will Transfer Sit To/From Stand Outcome: Progressing Flowsheets (Taken 07/03/2020 1141) Patient will transfer sit to/from stand: Independently Goal: Pt Will Transfer Bed To Chair/Chair To Bed Outcome: Progressing Flowsheets (Taken 07/03/2020 1141) Pt will Transfer Bed to Chair/Chair to Bed: Independently Goal: Pt Will Ambulate Outcome: Progressing Flowsheets (Taken 07/03/2020 1141) Pt will Ambulate:  > 125 feet  Independently  with least restrictive assistive device   Clarene Critchley PT, DPT 11:42 AM, 07/03/20 [phone]

## 2020-07-03 NOTE — Evaluation (Signed)
Physical Therapy Evaluation Patient Details Name: Mark Lane MRN: [id number] DOB: 08/02/1922 Today's Date: 07/03/2020   History of Present Illness  86 year old male with a history of systolic and diastolic CHF, ischemic cardiomyopathy, coronary artery disease with STEMI 2011, chronic atrial fibrillation, complete heart block status post PPM February 2020, hyperlipidemia presenting with generalized weakness and failure to thrive symptoms for the past 10 to 11 days.    Clinical Impression  Patient found awake and alert in bed. Patient performed bed mobility independently. Patient reported slight head fogginess upon sitting. RN reported patient's vital sign monitors and heart monitor could be removed during mobility, so asked for patient symptoms throughout mobility with patient denying any symptoms other than slight head fogginess upon sitting. Patient ambulated 125 feet without AD, but did demonstrate reaching for support without significant unsteadiness. Therapist recommending use of SPC and educated patient and son on this. Patient would benefit from continued skilled physical therapy while at hospital and at recommended venue below in order to continue progressing patient's strength, activity tolerance, and overall functional mobility.     Follow Up Recommendations Home health PT;Supervision - Intermittent;Supervision for mobility/OOB    Equipment Recommendations  Other (comment) (Recommend patient using SPC, and patient reported already having one)    Recommendations for Other Services       Precautions / Restrictions Precautions Precautions: Fall Restrictions Weight Bearing Restrictions: No      Mobility  Bed Mobility Overal bed mobility: Independent             General bed mobility comments: Patient performs supine to sit from flat bed independently  Transfers Overall transfer level: Modified independent Equipment used: None (Increased time)             General  transfer comment: Patient with minimal head fogginess upon sitting, rested for a minute prior to standing - no symptoms reported during standing or ambulation  Ambulation/Gait Ambulation/Gait assistance: Supervision Gait Distance (Feet): 125 Feet Assistive device: None Gait Pattern/deviations: Step-through pattern;Decreased stride length Gait velocity: Decreased   General Gait Details: Patient occassionally reached for countertop at one point, but appeared only minimally unsteady. Recommended to patient to use his cane at home.  Stairs            Wheelchair Mobility    Modified Rankin (Stroke Patients Only)       Balance Overall balance assessment: Modified Independent                                           Pertinent Vitals/Pain Pain Assessment: 0-10 Pain Score: 0-No pain    Home Living Family/patient expects to be discharged to:: Private residence Living Arrangements: Spouse/significant other Available Help at Discharge: Family;Available 24 hours/day Type of Home: House Home Access: Level entry     Home Layout: One level Home Equipment: Walker - 2 wheels;Cane - single point      Prior Function Level of Independence: Independent         Comments: Patient reports being a household ambulator and mowing his grass     Hand Dominance        Extremity/Trunk Assessment   Upper Extremity Assessment Upper Extremity Assessment: Generalized weakness    Lower Extremity Assessment Lower Extremity Assessment: Generalized weakness    Cervical / Trunk Assessment Cervical / Trunk Assessment: Kyphotic  Communication   Communication: No difficulties  Cognition Arousal/Alertness: Awake/alert Behavior  During Therapy: WFL for tasks assessed/performed Overall Cognitive Status: Within Functional Limits for tasks assessed                                        General Comments      Exercises     Assessment/Plan    PT  Assessment Patient needs continued PT services  PT Problem List Decreased strength;Decreased mobility;Decreased activity tolerance;Decreased balance       PT Treatment Interventions Gait training;Stair training;Functional mobility training;Therapeutic activities;Therapeutic exercise;Balance training;Neuromuscular re-education;Patient/family education    PT Goals (Current goals can be found in the Care Plan section)  Acute Rehab PT Goals Patient Stated Goal: To return home PT Goal Formulation: With patient Time For Goal Achievement: 07/10/20 Potential to Achieve Goals: Good    Frequency Min 2X/week   Barriers to discharge        Co-evaluation               AM-PAC PT "6 Clicks" Mobility  Outcome Measure Help needed turning from your back to your side while in a flat bed without using bedrails?: None Help needed moving from lying on your back to sitting on the side of a flat bed without using bedrails?: None Help needed moving to and from a bed to a chair (including a wheelchair)?: None Help needed standing up from a chair using your arms (e.g., wheelchair or bedside chair)?: None Help needed to walk in hospital room?: A Little Help needed climbing 3-5 steps with a railing? : A Little 6 Click Score: 22    End of Session Equipment Utilized During Treatment: Gait belt Activity Tolerance: Patient tolerated treatment well;No increased pain Patient left: in bed;with family/visitor present Nurse Communication: Mobility status PT Visit Diagnosis: Unsteadiness on feet (R26.81);Other abnormalities of gait and mobility (R26.89);Muscle weakness (generalized) (M62.81)    Time: 7209-4709 PT Time Calculation (min) (ACUTE ONLY): 15 min   Charges:   PT Evaluation $PT Eval Low Complexity: 1 Low         Clarene Critchley PT, DPT 11:40 AM, 07/03/20 [phone]

## 2020-07-03 NOTE — Progress Notes (Signed)
Pt was transported to room 301 via wheelchair. Pt placed in bed, bed alarm on and call light within reach.

## 2020-07-03 NOTE — Discharge Summary (Addendum)
Physician Discharge Summary  Mark Lane SLH:[id number] DOB: 1921-12-13 DOA: 07/02/2020  PCP: Reynold Bowen, MD  Admit date: 07/02/2020 Discharge date: 07/05/2020  Admitted From: Home Disposition:  Home    Recommendations for Outpatient Follow-up:  1. Follow up with PCP in 1-2 weeks 2. Please obtain BMP/CBC in one week   Home Health: HHPT set up   Discharge Condition: Stable CODE STATUS: FULL Diet recommendation: Heart Healthy    Brief/Interim Summary: 86 y.o.malewith medical history of86 year old male with a history of systolic and diastolic CHF, ischemic cardiomyopathy, coronary artery disease with STEMI 2011, chronic atrial fibrillation, complete heart block status post PPM February 2020, hyperlipidemia presenting with generalized weakness and failure to thrive symptoms for the past 10 to 11 days. In reality, the patient began developing decreased oral intake for about 2 weeks prior to this admission. Subsequently, he developed increasing generalized weakness requiring some assistance with ambulation secondary to unsteady gait. His son also states that he did require some assistance getting out of bed initially, but that has improved. However he continued to have some unsteady gait and dizziness with ambulation. He has not had any fevers, chills, chest pain, shortness of breath, vomiting, abdominal pain, dysuria, hematuria, headache, visual disturbance. Notably, the patient picked off the scab on the right volar surface of his right forearm about 1 week prior to this admission. In the past 24 to 48 hours he has noted some redness in that area. He denies any other rashes or synovitis. There is not been any new medications. The patient has had some loose stools for 5 days prior to this admission, but he states that that has been slowing down. He has had some occasional melanotic stools without any hematochezia. In addition, he has lost about 11 pounds in the past 4  months. The patient followed up with his gastroenterologist office on 06/29/2020. Hemoccult was negative during that office visit. Because he was complaining of diarrhea at that time, orders were placed for stool pathogen panel and repeat CBC. He subsequently followed up with Dr. Dr. Algernon Huxley in the CHF clinic on 06/30/2020.At that time, his lisinopril was decreased to 5 mg daily and furosemide was decreased to 20 mg daily. He was also noted that during these 2 office visits his systolic blood pressure was in the low 90s. Because of the erythema in his right forearm and continued weakness, he presented to the emergency department for further evaluation. In the emergency department, the patient was afebrile with soft blood pressures in the low 90s and upper 80s. Oxygen saturation was 99% on room air. Sodium 134, potassium 3.5, chloride 102, CO2 25, BUN 34, creatinine 1.8. LFTs were unremarkable. WBC 6.5, hemoglobin 9.5, platelets 254,000. Lactic acid was 1.9. UA was negative for pyuria. Chest x-ray was negative for any acute findings.  Discharge Diagnoses:  Generalized weakness/failure to thrive/Dehydration -TSH--0.865 -Serum L57-->262-->MBTDHRC -Folic BULA--45.3 -A.m. cortisol--6.2 -cosyntropin stimulation test-->appropriate response -PT evaluation-->HHPT -Repeat echocardiogram--EF 50%, G2DD, trivial /TR/MR/AI --overall improved since stopping meds and gently hydrating  Hypotension -likely multifactorial including dehydration, cardiac meds, and FTT -Lactic acid peaked 1.9 -Blood cultures x2 sets--neg to date -UA negative for pyuria -Chest x-ray negative for infiltrates -Check procalcitonin < 0.10 -Suspect may be partly related to the patient's carvedilol, lisinopril, spironolactone, furosemide in the setting of failure to thrive and weight loss -Holding carvedilol, lisinopril, spironolactone, furosemide temporarily -carvedilol restarted -Judicious IV fluids x 12 hours -overall  improved since stopping meds and gently hydrating -8/30--restarted lower dose lisinopril 2.5 mg -restart  lasix, spiro 9/03   Chronic systolic and diastolic CHF -remains clinically euvolemic -07/03/20 Echo--EF 50%, G2DD, trivial /TRMR/AI, normal PASP  CKD stage IIIb -Baseline creatinine 1.4-1.7 -Patient presented with serum creatinine 1.88 -improving with IVF  Melanotic stool -Patient had negative FOBT during his GI visit on 06/29/2020 -8/25 and 8/28 Repeat FOBT -GI consult-->EGD on 07/05/20 -07/05/20 EGD--> 4 cm paraesophageal hiatal hernia with presence of erythematous nodularity in the hiatal hernia site as well as in the gastric body which was extensively biopsied.  There was no presence of hematin or any evidence of recent bleeding; few gastric polyps -Continue IV Protonix twice daily>>po -hold apixaban temporarily>>restarted at time d/c -drop in Hgb partly dilution--Hgb largely remained stable during hospitalization without obvious signs of acute/active blood loss  Loose stools -Stool pathogen panel--no further loose stools to collect  Right forearm cellulitis -Continue cefazolin>>resolved -d/c home with cephalexin x 4 more days  Hyponatremia -Secondary to volume depletion and poor solute intake -Start judicious normal saline-->improving  Anemia of chronic disease -06/30/2020 iron saturation 24%, ferritin 231 -Baseline hemoglobin~12 -Presented with hemoglobin 11.5 -8/25 and 8/28 FOBT--neg  Chronic atrial fibrillation -holdapixaban temporarily>>restart  Ischemic cardiomyopathy/CAD -No chest pain presently -07/22/2019 echo EF 45-50%, impaired relaxation, mild anteroseptal HK  Complete heart block -Status post PPM February 2020  Hyperlipidemia -continue statin  Discharge Instructions   Allergies as of 07/05/2020   No Known Allergies     Medication List    TAKE these medications   apixaban 2.5 MG Tabs tablet Commonly known as: ELIQUIS Take 1  tablet (2.5 mg total) by mouth 2 (two) times daily.   carvedilol 3.125 MG tablet Commonly known as: COREG TAKE  (1)  TABLET TWICE A DAY.   cephALEXin 500 MG capsule Commonly known as: KEFLEX Take 1 capsule (500 mg total) by mouth 2 (two) times daily.   cholecalciferol 1000 units tablet Commonly known as: VITAMIN D Take 1,000 Units by mouth daily. 2 tablets   furosemide 20 MG tablet Commonly known as: LASIX TAKE 1 TABLET DAILY What changed: Another medication with the same name was removed. Continue taking this medication, and follow the directions you see here.   lisinopril 2.5 MG tablet Commonly known as: ZESTRIL Take 1 tablet (2.5 mg total) by mouth daily. What changed:   medication strength  how much to take   ondansetron 4 MG disintegrating tablet Commonly known as: Zofran ODT Take 1 tablet (4 mg total) by mouth every 8 (eight) hours as needed for nausea or vomiting.   pantoprazole 40 MG tablet Commonly known as: PROTONIX Take 1 tablet (40 mg total) by mouth daily.   simvastatin 40 MG tablet Commonly known as: ZOCOR Take 1 tablet (40 mg total) by mouth at bedtime.   spironolactone 25 MG tablet Commonly known as: ALDACTONE Take 12.5 mg by mouth daily.   vitamin B-12 500 MCG tablet Commonly known as: CYANOCOBALAMIN Take 1 tablet (500 mcg total) by mouth daily.       Follow-up Information    [facility] Follow up.   Specialty: Home Health Services Why:   PT Contact information: [place] [address] [phone]              No Known Allergies  Consultations:  GI   Procedures/Studies: DG Chest Portable 1 View  Result Date: 07/02/2020 CLINICAL DATA:  Weakness. EXAM: PORTABLE CHEST 1 VIEW COMPARISON:  None. FINDINGS: A 3 lead pacemaker is noted. Stable cardiomediastinal silhouette. No pneumothorax. No  nodules or masses. No focal infiltrates. IMPRESSION: No active disease. Electronically Signed   By: Dorise Bullion  III M.D   On: 07/02/2020 13:11   ECHOCARDIOGRAM COMPLETE  Result Date: 07/03/2020    ECHOCARDIOGRAM REPORT   Patient Name:   Mark Lane Date of Exam: 07/03/2020 Medical Rec #:  [id number]     Height:       65.0 in Accession #:    [id number]    Weight:       139.1 lb Date of Birth:  1922-03-30      BSA:          1.695 m Patient Age:    86 years      BP:           90/42 mmHg Patient Gender: M             HR:           60 bpm. Exam Location:  [facility] Procedure: 2D Echo, Cardiac Doppler and Color Doppler Indications:    Cardiomyopathy  History:        Patient has prior history of Echocardiogram examinations, most                 recent 07/22/2019. CHF and Cardiomyopathy, CAD and Previous                 Myocardial Infarction, Pacemaker, Arrythmias:Atrial                 Fibrillation; Risk Factors:Dyslipidemia.  Sonographer:    Dustin Flock RDCS Referring Phys: [phone] Kyrstyn Greear  Sonographer Comments: Patient very thin. IMPRESSIONS  1. Left ventricular ejection fraction, by estimation, is 50%. The left ventricle has low normal function. The left ventricle demonstrates regional wall motion abnormalities (see scoring diagram/findings for description). Left ventricular diastolic parameters are consistent with Grade II diastolic dysfunction (pseudonormalization).  2. Right ventricular systolic function is normal. The right ventricular size is normal. There is normal pulmonary artery systolic pressure. The estimated right ventricular systolic pressure is 79.0 mmHg.  3. Left atrial size was moderately dilated.  4. The mitral valve is grossly normal. Trivial mitral valve regurgitation.  5. The aortic valve is tricuspid. Aortic valve regurgitation is trivial. Mild aortic valve sclerosis is present, with no evidence of aortic valve stenosis.  6. The inferior vena cava is normal in size with greater than 50% respiratory variability, suggesting right atrial pressure of 3 mmHg. FINDINGS  Left Ventricle: Left ventricular  ejection fraction, by estimation, is 50%. The left ventricle has low normal function. The left ventricle demonstrates regional wall motion abnormalities. The left ventricular internal cavity size was normal in size. There is no left ventricular hypertrophy. Left ventricular diastolic parameters are consistent with Grade II diastolic dysfunction (pseudonormalization).  LV Wall Scoring: The mid and distal anterior septum and apex are hypokinetic. The entire anterior wall, entire lateral wall, entire inferior wall, basal anteroseptal segment, mid inferoseptal segment, and basal inferoseptal segment are normal. Right Ventricle: The right ventricular size is normal. No increase in right ventricular wall thickness. Right ventricular systolic function is normal. There is normal pulmonary artery systolic pressure. The tricuspid regurgitant velocity is 2.46 m/s, and  with an assumed right atrial pressure of 3 mmHg, the estimated right ventricular systolic pressure is 24.0 mmHg. Left Atrium: Left atrial size was moderately dilated. Right Atrium: Right atrial size was normal in size. Pericardium: There is no evidence of pericardial effusion. Mitral Valve: The mitral valve is grossly normal.  Mild mitral annular calcification. Trivial mitral valve regurgitation. Tricuspid Valve: The tricuspid valve is not well visualized. Tricuspid valve regurgitation is trivial. Aortic Valve: The aortic valve is tricuspid. Aortic valve regurgitation is trivial. Mild aortic valve sclerosis is present, with no evidence of aortic valve stenosis. Mild aortic valve annular calcification. Pulmonic Valve: The pulmonic valve was grossly normal. Pulmonic valve regurgitation is trivial. Aorta: The aortic root is normal in size and structure. Venous: The inferior vena cava is normal in size with greater than 50% respiratory variability, suggesting right atrial pressure of 3 mmHg. IAS/Shunts: No atrial level shunt detected by color flow Doppler.  LEFT  VENTRICLE PLAX 2D LVIDd:         4.86 cm  Diastology LVIDs:         3.76 cm  LV e' lateral:   8.38 cm/s LV PW:         0.98 cm  LV E/e' lateral: 11.0 LV IVS:        0.88 cm  LV e' medial:    5.33 cm/s LVOT diam:     2.10 cm  LV E/e' medial:  17.3 LV SV:         41 LV SV Index:   24 LVOT Area:     3.46 cm  RIGHT VENTRICLE RV Basal diam:  3.08 cm RV S prime:     11.00 cm/s TAPSE (M-mode): 2.6 cm LEFT ATRIUM             Index       RIGHT ATRIUM           Index LA diam:        3.10 cm 1.83 cm/m  RA Area:     15.00 cm LA Vol (A2C):   38.3 ml 22.59 ml/m RA Volume:   33.30 ml  19.64 ml/m LA Vol (A4C):   78.0 ml 46.01 ml/m LA Biplane Vol: 60.5 ml 35.69 ml/m  AORTIC VALVE LVOT Vmax:   56.80 cm/s LVOT Vmean:  37.800 cm/s LVOT VTI:    0.118 m  AORTA Ao Root diam: 3.30 cm MITRAL VALVE               TRICUSPID VALVE MV Area (PHT): 2.94 cm    TR Peak grad:   24.2 mmHg MV Decel Time: 258 msec    TR Vmax:        246.00 cm/s MV E velocity: 92.10 cm/s MV A velocity: 19.30 cm/s  SHUNTS MV E/A ratio:  4.77        Systemic VTI:  0.12 m                            Systemic Diam: 2.10 cm Rozann Lesches MD Electronically signed by Rozann Lesches MD Signature Date/Time: 07/03/2020/11:14:35 AM    Final    CUP PACEART REMOTE DEVICE CHECK  Result Date: 07/01/2020 Scheduled remote reviewed. Normal device function.  Known permanent atrial fib, on [place].  Good ventricular rate control Next remote 91 days. Kathy Breach, RN, CCDS, [employer]       Discharge Exam: Vitals:   07/05/20 1215 07/05/20 1340  BP: 116/68 112/61  Pulse: (!) 59 63  Resp: 19 16  Temp:  97.7 F (36.5 C)  SpO2: 98% 97%   Vitals:   07/05/20 1145 07/05/20 1200 07/05/20 1215 07/05/20 1340  BP: 104/65 110/67 116/68 112/61  Pulse: (!) 59 67 (!) 59 63  Resp:  12 15 19 16   Temp:    97.7 F (36.5 C)  TempSrc:    Oral  SpO2: 99% 100% 98% 97%  Weight:      Height:        General: Pt is alert, awake, not in acute distress Cardiovascular:  RRR, S1/S2 +, no rubs, no gallops Respiratory: CTA bilaterally, no wheezing, no rhonchi Abdominal: Soft, NT, ND, bowel sounds + Extremities: no edema, no cyanosis   The results of significant diagnostics from this hospitalization (including imaging, microbiology, ancillary and laboratory) are listed below for reference.    Significant Diagnostic Studies: DG Chest Portable 1 View  Result Date: 07/02/2020 CLINICAL DATA:  Weakness. EXAM: PORTABLE CHEST 1 VIEW COMPARISON:  None. FINDINGS: A 3 lead pacemaker is noted. Stable cardiomediastinal silhouette. No pneumothorax. No nodules or masses. No focal infiltrates. IMPRESSION: No active disease. Electronically Signed   By: Dorise Bullion III M.D   On: 07/02/2020 13:11   ECHOCARDIOGRAM COMPLETE  Result Date: 07/03/2020    ECHOCARDIOGRAM REPORT   Patient Name:   Mark Lane Date of Exam: 07/03/2020 Medical Rec #:  [id number]     Height:       65.0 in Accession #:    [id number]    Weight:       139.1 lb Date of Birth:  1922-10-30      BSA:          1.695 m Patient Age:    63 years      BP:           90/42 mmHg Patient Gender: M             HR:           60 bpm. Exam Location:  [facility] Procedure: 2D Echo, Cardiac Doppler and Color Doppler Indications:    Cardiomyopathy  History:        Patient has prior history of Echocardiogram examinations, most                 recent 07/22/2019. CHF and Cardiomyopathy, CAD and Previous                 Myocardial Infarction, Pacemaker, Arrythmias:Atrial                 Fibrillation; Risk Factors:Dyslipidemia.  Sonographer:    Dustin Flock RDCS Referring Phys: [phone] Mekhi Lascola  Sonographer Comments: Patient very thin. IMPRESSIONS  1. Left ventricular ejection fraction, by estimation, is 50%. The left ventricle has low normal function. The left ventricle demonstrates regional wall motion abnormalities (see scoring diagram/findings for description). Left ventricular diastolic parameters are consistent with Grade II  diastolic dysfunction (pseudonormalization).  2. Right ventricular systolic function is normal. The right ventricular size is normal. There is normal pulmonary artery systolic pressure. The estimated right ventricular systolic pressure is 24.2 mmHg.  3. Left atrial size was moderately dilated.  4. The mitral valve is grossly normal. Trivial mitral valve regurgitation.  5. The aortic valve is tricuspid. Aortic valve regurgitation is trivial. Mild aortic valve sclerosis is present, with no evidence of aortic valve stenosis.  6. The inferior vena cava is normal in size with greater than 50% respiratory variability, suggesting right atrial pressure of 3 mmHg. FINDINGS  Left Ventricle: Left ventricular ejection fraction, by estimation, is 50%. The left ventricle has low normal function. The left ventricle demonstrates regional wall motion abnormalities. The left ventricular internal cavity size was normal in size. There is no left ventricular  hypertrophy. Left ventricular diastolic parameters are consistent with Grade II diastolic dysfunction (pseudonormalization).  LV Wall Scoring: The mid and distal anterior septum and apex are hypokinetic. The entire anterior wall, entire lateral wall, entire inferior wall, basal anteroseptal segment, mid inferoseptal segment, and basal inferoseptal segment are normal. Right Ventricle: The right ventricular size is normal. No increase in right ventricular wall thickness. Right ventricular systolic function is normal. There is normal pulmonary artery systolic pressure. The tricuspid regurgitant velocity is 2.46 m/s, and  with an assumed right atrial pressure of 3 mmHg, the estimated right ventricular systolic pressure is 63.3 mmHg. Left Atrium: Left atrial size was moderately dilated. Right Atrium: Right atrial size was normal in size. Pericardium: There is no evidence of pericardial effusion. Mitral Valve: The mitral valve is grossly normal. Mild mitral annular calcification. Trivial  mitral valve regurgitation. Tricuspid Valve: The tricuspid valve is not well visualized. Tricuspid valve regurgitation is trivial. Aortic Valve: The aortic valve is tricuspid. Aortic valve regurgitation is trivial. Mild aortic valve sclerosis is present, with no evidence of aortic valve stenosis. Mild aortic valve annular calcification. Pulmonic Valve: The pulmonic valve was grossly normal. Pulmonic valve regurgitation is trivial. Aorta: The aortic root is normal in size and structure. Venous: The inferior vena cava is normal in size with greater than 50% respiratory variability, suggesting right atrial pressure of 3 mmHg. IAS/Shunts: No atrial level shunt detected by color flow Doppler.  LEFT VENTRICLE PLAX 2D LVIDd:         4.86 cm  Diastology LVIDs:         3.76 cm  LV e' lateral:   8.38 cm/s LV PW:         0.98 cm  LV E/e' lateral: 11.0 LV IVS:        0.88 cm  LV e' medial:    5.33 cm/s LVOT diam:     2.10 cm  LV E/e' medial:  17.3 LV SV:         41 LV SV Index:   24 LVOT Area:     3.46 cm  RIGHT VENTRICLE RV Basal diam:  3.08 cm RV S prime:     11.00 cm/s TAPSE (M-mode): 2.6 cm LEFT ATRIUM             Index       RIGHT ATRIUM           Index LA diam:        3.10 cm 1.83 cm/m  RA Area:     15.00 cm LA Vol (A2C):   38.3 ml 22.59 ml/m RA Volume:   33.30 ml  19.64 ml/m LA Vol (A4C):   78.0 ml 46.01 ml/m LA Biplane Vol: 60.5 ml 35.69 ml/m  AORTIC VALVE LVOT Vmax:   56.80 cm/s LVOT Vmean:  37.800 cm/s LVOT VTI:    0.118 m  AORTA Ao Root diam: 3.30 cm MITRAL VALVE               TRICUSPID VALVE MV Area (PHT): 2.94 cm    TR Peak grad:   24.2 mmHg MV Decel Time: 258 msec    TR Vmax:        246.00 cm/s MV E velocity: 92.10 cm/s MV A velocity: 19.30 cm/s  SHUNTS MV E/A ratio:  4.77        Systemic VTI:  0.12 m  Systemic Diam: 2.10 cm Rozann Lesches MD Electronically signed by Rozann Lesches MD Signature Date/Time: 07/03/2020/11:14:35 AM    Final    CUP PACEART REMOTE DEVICE  CHECK  Result Date: 07/01/2020 Scheduled remote reviewed. Normal device function.  Known permanent atrial fib, on [place].  Good ventricular rate control Next remote 91 days. Kathy Breach, RN, CCDS, [employer]    Microbiology: Recent Results (from the past 240 hour(s))  SARS Coronavirus 2 by RT PCR (hospital order, performed in [facility] hospital lab) Nasopharyngeal Nasopharyngeal Swab     Status: None   Collection Time: 07/02/20  2:12 PM   Specimen: Nasopharyngeal Swab  Result Value Ref Range Status   SARS Coronavirus 2 NEGATIVE NEGATIVE Final    Comment: (NOTE) SARS-CoV-2 target nucleic acids are NOT DETECTED.  The SARS-CoV-2 RNA is generally detectable in upper and lower respiratory specimens during the acute phase of infection. The lowest concentration of SARS-CoV-2 viral copies this assay can detect is 250 copies / mL. A negative result does not preclude SARS-CoV-2 infection and should not be used as the sole basis for treatment or other patient management decisions.  A negative result may occur with improper specimen collection / handling, submission of specimen other than nasopharyngeal swab, presence of viral mutation(s) within the areas targeted by this assay, and inadequate number of viral copies (<250 copies / mL). A negative result must be combined with clinical observations, patient history, and epidemiological information.  Fact Sheet for Patients:   StrictlyIdeas.no  Fact Sheet for Healthcare Providers: BankingDealers.co.za  This test is not yet approved or  cleared by the [place] FDA and has been authorized for detection and/or diagnosis of SARS-CoV-2 by FDA under an Emergency Use Authorization (EUA).  This EUA will remain in effect (meaning this test can be used) for the duration of the COVID-19 declaration under Section 564(b)(1) of the Act, 21 U.S.C. section 360bbb-3(b)(1), unless the authorization  is terminated or revoked sooner.  Performed at [facility], [address] [place], [place] [address]   Culture, blood (routine x 2)     Status: None (Preliminary result)   Collection Time: 07/02/20  2:38 PM   Specimen: BLOOD  Result Value Ref Range Status   Specimen Description BLOOD  Final   Special Requests NONE  Final   Culture   Final    NO GROWTH 3 DAYS Performed at [facility] [address], [place], [place] [address]    Report Status PENDING  Incomplete  Culture, blood (routine x 2)     Status: None (Preliminary result)   Collection Time: 07/02/20  2:38 PM   Specimen: BLOOD  Result Value Ref Range Status   Specimen Description BLOOD  Final   Special Requests NONE  Final   Culture   Final    NO GROWTH 3 DAYS Performed at [facility], [address] [place], [place] [address]    Report Status PENDING  Incomplete  MRSA PCR Screening     Status: None   Collection Time: 07/02/20  6:31 PM   Specimen: Nasal Mucosa; Nasopharyngeal  Result Value Ref Range Status   MRSA by PCR NEGATIVE NEGATIVE Final    Comment:        The GeneXpert MRSA Assay (FDA approved for NASAL specimens only), is one component of a comprehensive MRSA colonization surveillance program. It is not intended to diagnose MRSA infection nor to guide or monitor treatment for MRSA infections. Performed at [facility], [place]  [address] [place], [place] [address]      Labs: Basic Metabolic Panel: Recent Labs  Lab 06/29/20 1149 06/29/20 1149 07/02/20 1211 07/02/20 1211 07/03/20 0620 07/03/20 0620 07/04/20 0708 07/05/20 0542  NA 136  --  134*  --  139  --  138 137  K 3.7   < > 3.5   < > 4.0   < > 4.0 3.9  CL 100  --  102  --  106  --  107 105  CO2 27  --  25  --  27  --  25 25  GLUCOSE 102  --  111*  --  103*  --  106* 95  BUN 28*  --  34*  --  28*  --  20 21  CREATININE 1.64*  --  1.88*  --  1.62*  --  1.35* 1.24  CALCIUM 9.0  --  8.7*  --  8.3*  --  8.4* 8.8*  MG  --   --   --   --  2.0   --  1.9 2.0   < > = values in this interval not displayed.   Liver Function Tests: Recent Labs  Lab 06/29/20 1149 07/02/20 1211  AST 26 22  ALT 13 15  ALKPHOS  --  39  BILITOT 0.5 0.7  PROT 6.9 6.7  ALBUMIN  --  3.7   No results for input(s): LIPASE, AMYLASE in the last 168 hours. No results for input(s): AMMONIA in the last 168 hours. CBC: Recent Labs  Lab 06/29/20 1149 07/02/20 1211 07/03/20 0620 07/04/20 0708 07/05/20 0542  WBC 5.5 6.5 5.4 6.2 7.0  NEUTROABS 3,256 4.0  --   --   --   HGB 12.8* 11.5* 10.6* 10.3* 9.9*  HCT 39.1 35.2* 32.9* 31.8* 30.7*  MCV 93.8 94.1 94.8 94.4 94.8  PLT 157 154 149* 154 163   Cardiac Enzymes: No results for input(s): CKTOTAL, CKMB, CKMBINDEX, TROPONINI in the last 168 hours. BNP: Invalid input(s): POCBNP CBG: No results for input(s): GLUCAP in the last 168 hours.  Time coordinating discharge:  36 minutes  Signed:  Orson Eva, DO [facility] Pager: [phone] 07/05/2020, 6:50 PM

## 2020-07-03 NOTE — Progress Notes (Signed)
  Echocardiogram 2D Echocardiogram has been performed.  Mark Lane 07/03/2020, 8:47 AM

## 2020-07-04 DIAGNOSIS — I959 Hypotension, unspecified: Secondary | ICD-10-CM

## 2020-07-04 DIAGNOSIS — D649 Anemia, unspecified: Secondary | ICD-10-CM

## 2020-07-04 LAB — ACTH STIMULATION, 3 TIME POINTS
Cortisol, 30 Min: 22.7 ug/dL
Cortisol, 60 Min: 31.3 ug/dL
Cortisol, Base: 9.8 ug/dL

## 2020-07-04 LAB — CBC
HCT: 31.8 % — ABNORMAL LOW (ref 39.0–52.0)
Hemoglobin: 10.3 g/dL — ABNORMAL LOW (ref 13.0–17.0)
MCH: 30.6 pg (ref 26.0–34.0)
MCHC: 32.4 g/dL (ref 30.0–36.0)
MCV: 94.4 fL (ref 80.0–100.0)
Platelets: 154 10*3/uL (ref 150–400)
RBC: 3.37 MIL/uL — ABNORMAL LOW (ref 4.22–5.81)
RDW: 13.2 % (ref 11.5–15.5)
WBC: 6.2 10*3/uL (ref 4.0–10.5)
nRBC: 0 % (ref 0.0–0.2)

## 2020-07-04 LAB — BASIC METABOLIC PANEL
Anion gap: 6 (ref 5–15)
BUN: 20 mg/dL (ref 8–23)
CO2: 25 mmol/L (ref 22–32)
Calcium: 8.4 mg/dL — ABNORMAL LOW (ref 8.9–10.3)
Chloride: 107 mmol/L (ref 98–111)
Creatinine, Ser: 1.35 mg/dL — ABNORMAL HIGH (ref 0.61–1.24)
GFR calc Af Amer: 51 mL/min — ABNORMAL LOW (ref >60)
GFR calc non Af Amer: 44 mL/min — ABNORMAL LOW (ref >60)
Glucose, Bld: 106 mg/dL — ABNORMAL HIGH (ref 70–99)
Potassium: 4 mmol/L (ref 3.5–5.1)
Sodium: 138 mmol/L (ref 135–145)

## 2020-07-04 LAB — MAGNESIUM: Magnesium: 1.9 mg/dL (ref 1.7–2.4)

## 2020-07-04 MED ORDER — APIXABAN 2.5 MG PO TABS: 2.5000 mg | ORAL_TABLET | Freq: Two times a day (BID) | ORAL | Status: DC

## 2020-07-04 MED ORDER — FUROSEMIDE 20 MG PO TABS
20.0000 mg | ORAL_TABLET | Freq: Every day | ORAL | Status: DC
  Filled 2020-07-04: qty 1

## 2020-07-04 MED ORDER — LISINOPRIL 5 MG PO TABS
2.5000 mg | ORAL_TABLET | Freq: Every day | ORAL | Status: DC
  Administered 2020-07-04: 2.5 mg via ORAL
  Filled 2020-07-04 (×2): qty 1

## 2020-07-04 NOTE — Progress Notes (Addendum)
Physical Therapy Treatment Patient Details Name: Mark Lane MRN: [id number] DOB: 08/30/1922 Today's Date: 07/04/2020    History of Present Illness 86 year old male with a history of systolic and diastolic CHF, ischemic cardiomyopathy, coronary artery disease with STEMI 2011, chronic atrial fibrillation, complete heart block status post PPM February 2020, hyperlipidemia presenting with generalized weakness and failure to thrive symptoms for the past 10 to 11 days.    PT Comments    The patient was able to perform supine to sit with modified independence today. Although suggested he tries ambulating without AD, the patient reported he would feel more comfortable with a RW. The patient tolerated sitting EOB without dizziness or lightheadedness and performed sit to stand transfer min guard. The patient ambulated 125 feet with supervision and displayed gait WFL, although he took extra time and steps during turns with the RW. Patient tolerated sitting up in chair after therapy- RN notified. PLAN: The patient will continue to benefit from skilled physical therapy services in hospital at recommended venue below in order to improve balance, gait, and ADL's to promote independence in functional activities.     Follow Up Recommendations  Home health PT;Supervision - Intermittent;Supervision for mobility/OOB     Equipment Recommendations  Other (comment) (SPC, pt reports he already has one)    Recommendations for Other Services       Precautions / Restrictions Precautions Precautions: Fall Restrictions Weight Bearing Restrictions: No    Mobility  Bed Mobility Overal bed mobility: Independent             General bed mobility comments: Patient performs supine to sit from flat bed independently  Transfers Overall transfer level: Modified independent Equipment used: Rolling walker (2 wheeled)             General transfer comment: pt able to perform sit to stand with increased time  using RW, asked pt if he would like to ambulate without RW but pt stated he preferred to use the AD  Ambulation/Gait Ambulation/Gait assistance: Supervision Gait Distance (Feet): 125 Feet Assistive device: Rolling walker (2 wheeled) Gait Pattern/deviations: Step-through pattern;Decreased stride length;WFL(Within Functional Limits) Gait velocity: Decreased       Stairs             Wheelchair Mobility    Modified Rankin (Stroke Patients Only)       Balance Overall balance assessment: Modified Independent Sitting-balance support: No upper extremity supported;Feet supported Sitting balance-Leahy Scale: Normal     Standing balance support: Bilateral upper extremity supported;During functional activity Standing balance-Leahy Scale: Good Standing balance comment: trunk slightly flexed over RW                            Cognition Arousal/Alertness: Awake/alert Behavior During Therapy: WFL for tasks assessed/performed Overall Cognitive Status: Within Functional Limits for tasks assessed                                        Exercises General Exercises - Lower Extremity Long Arc Quad: AROM;Strengthening;Seated;Both;10 reps Hip Flexion/Marching: AROM;Seated;Strengthening;Both;10 reps Toe Raises: AROM;Strengthening;Seated;Both;10 reps Heel Raises: AROM;Strengthening;Seated;Both;10 reps    General Comments        Pertinent Vitals/Pain Pain Assessment: No/denies pain    Home Living                      Prior Function  PT Goals (current goals can now be found in the care plan section) Acute Rehab PT Goals Patient Stated Goal: To return home PT Goal Formulation: With patient Time For Goal Achievement: 07/10/20 Potential to Achieve Goals: Good Progress towards PT goals: Progressing toward goals    Frequency    Min 2X/week      PT Plan Current plan remains appropriate    Co-evaluation               AM-PAC PT "6 Clicks" Mobility   Outcome Measure  Help needed turning from your back to your side while in a flat bed without using bedrails?: None Help needed moving from lying on your back to sitting on the side of a flat bed without using bedrails?: None Help needed moving to and from a bed to a chair (including a wheelchair)?: None Help needed standing up from a chair using your arms (e.g., wheelchair or bedside chair)?: None Help needed to walk in hospital room?: A Little Help needed climbing 3-5 steps with a railing? : A Little 6 Click Score: 22    End of Session Equipment Utilized During Treatment: Gait belt Activity Tolerance: Patient tolerated treatment well;No increased pain Patient left: in chair;with call bell/phone within reach Nurse Communication: Mobility status PT Visit Diagnosis: Unsteadiness on feet (R26.81);Other abnormalities of gait and mobility (R26.89);Muscle weakness (generalized) (M62.81)     Time: 6789-3810 PT Time Calculation (min) (ACUTE ONLY): 23 min  Charges:  $Gait Training: 8-22 mins $Therapeutic Exercise: 8-22 mins                     4:01 PM , 07/04/20 Karlyn Agee, SPT Physical Therapy with [facility] [phone] office  During this treatment session, the therapist was present, participating in and directing the treatment.  4:01 PM, 07/04/20 Lonell Grandchild, MPT Physical Therapist with [facility] [phone] office [phone] mobile phone

## 2020-07-04 NOTE — Progress Notes (Signed)
PROGRESS NOTE  Mark Lane YBO:[id number] DOB: 10-06-1922 DOA: 07/02/2020 PCP: Reynold Bowen, MD  Brief History:  86 y.o.malewith medical history of32 year old male with a history of systolic and diastolic CHF, ischemic cardiomyopathy, coronary artery disease with STEMI 2011, chronic atrial fibrillation, complete heart block status post PPM February 2020, hyperlipidemia presenting with generalized weakness and failure to thrive symptoms for the past 10 to 11 days. In reality, the patient began developing decreased oral intake for about 2 weeks prior to this admission. Subsequently, he developed increasing generalized weakness requiring some assistance with ambulation secondary to unsteady gait. His son also states that he did require some assistance getting out of bed initially, but that has improved. However he continued to have some unsteady gait and dizziness with ambulation. He has not had any fevers, chills, chest pain, shortness of breath, vomiting, abdominal pain, dysuria, hematuria, headache, visual disturbance. Notably, the patient picked off the scab on the right volar surface of his right forearm about 1 week prior to this admission. In the past 24 to 48 hours he has noted some redness in that area. He denies any other rashes or synovitis. There is not been any new medications. The patient has had some loose stools for 5 days prior to this admission, but he states that that has been slowing down. He has had some occasional melanotic stools without any hematochezia. In addition, he has lost about 11 pounds in the past 4 months. The patient followed up with his gastroenterologist office on 06/29/2020. Hemoccult was negative during that office visit. Because he was complaining of diarrhea at that time, orders were placed for stool pathogen panel and repeat CBC. He subsequently followed up with Dr. Dr. Algernon Huxley in the CHF clinic on 06/30/2020.At that time, his lisinopril  was decreased to 5 mg daily and furosemide was decreased to 20 mg daily. He was also noted that during these 2 office visits his systolic blood pressure was in the low 90s. Because of the erythema in his right forearm and continued weakness, he presented to the emergency department for further evaluation. In the emergency department, the patient was afebrile with soft blood pressures in the low 90s and upper 80s. Oxygen saturation was 99% on room air. Sodium 134, potassium 3.5, chloride 102, CO2 25, BUN 34, creatinine 1.8. LFTs were unremarkable. WBC 6.5, hemoglobin 9.5, platelets 254,000. Lactic acid was 1.9. UA was negative for pyuria. Chest x-ray was negative for any acute findings. -his anti-HTN/CHF meds were initially held and patient was gently hydrated x 12 hours -his BP and generalized weakness improved -PT evaluated patient -his cardiac meds were slowly re-introduced which he tolerated  Assessment/Plan:  Generalized weakness/failure to thrive/Dehydration -TSH--0.865 -Serum I77-->824-->MPNTIRW -Folic ERXV--40.0 -am cortisol 6.2 -cosyntropin stimulation test-->appropriate response -PT evaluation-->HHPT -Repeat echocardiogram--EF 50%, G2DD, trivial /TR/MR/AI -overall improved since stopping meds and gently hydrating  Hypotension -likely multifactorial including dehydration, cardiac meds, and FTT -Lactic acid peaked 1.9 -Blood cultures x2 sets--neg to date -UA negative for pyuria -Chest x-ray negative for infiltrates -Check procalcitonin < 0.10 -Suspect may be partly related to the patient's carvedilol, lisinopril, spironolactone, furosemide in the setting of failure to thrive and weight loss -Holding carvedilol, lisinopril, spironolactone, furosemide temporarily -Judicious IV fluids x 12 hours -overall improved since stopping meds and gently hydrating -8/30--restarted lower dose lisinopril 2.5 mg -plan to restart lasix 8/67  Chronic systolic and diastolic  CHF -remains clinically euvolemic -07/03/20 Echo--EF 50%, G2DD, trivial /TRMR/AI, normal  PASP  CKD stage IIIb -Baseline creatinine 1.4-1.7 -Patient presented with serum creatinine 1.88 -improved with IVF  Melanotic stool -Patient had negative FOBT during his GI visit on 06/29/2020 -8/25 and 8/28 Repeat FOBT -GI consult -Continue IV Protonix twice daily -hold apixaban temporarily-->Hgb stable-->restart 8/31  Loose stools -Stool pathogen panel-no further loose stool to collect  Right forearm cellulitis -Continue cefazolin -overall improved -plan to d/c home with 5 more days cephalexin  Hyponatremia -Secondary to volume depletion and poor solute intake -Start judicious normal saline-->improved  Anemia of chronic disease -06/30/2020 iron saturation 24%, ferritin 231 -Baseline hemoglobin~12 -Presented with hemoglobin 11.5 -8/25 and 8/28 FOBT--neg -B12--129-->IM dose given x 1, then started po  Chronic atrial fibrillation -holdapixaban temporarily -restart apixaban 8/31  Ischemic cardiomyopathy/CAD -No chest pain presently -07/22/2019 echo EF 45-50%, impaired relaxation, mild anteroseptal HK  Complete heart block -Status post PPM February 2020  Hyperlipidemia -continue statin      Status is: Inpatient  Remains inpatient appropriate because:Hemodynamically unstable   Dispo: The patient is from: Home  Anticipated d/c is to: Home  Anticipated d/c date is: 8/31  Patient currently is not medically stable to d/c.        Family Communication:  granddaughter updated 8/30  Consultants:  GI  Code Status:  FULL   DVT Prophylaxis:  SCDs   Procedures: As Listed in Progress Note Above  Antibiotics: None      Subjective: Patient denies fevers, chills, headache, chest pain, dyspnea, nausea, vomiting, diarrhea, abdominal pain, dysuria, hematuria, hematochezia, and  melena.   Objective: Vitals:   07/03/20 1300 07/03/20 1850 07/04/20 0500 07/04/20 1633  BP: 126/65 (!) 107/48 (!) 106/53 (!) 104/53  Pulse: 60 60 70 64  Resp: 17 18 16 18   Temp:  98.2 F (36.8 C) 97.8 F (36.6 C) 97.7 F (36.5 C)  TempSrc:  Oral  Oral  SpO2: 94% 100% 100% 93%  Weight:      Height:       No intake or output data in the 24 hours ending 07/04/20 1813 Weight change:  Exam:   General:  Pt is alert, follows commands appropriately, not in acute distress  HEENT: No icterus, No thrush, No neck mass, [place]/AT  Cardiovascular: RRR, S1/S2, no rubs, no gallops  Respiratory: CTA bilaterally, no wheezing, no crackles, no rhonchi  Abdomen: Soft/+BS, non tender, non distended, no guarding  Extremities: No edema, No lymphangitis, No petechiae, No rashes, no synovitis   Data Reviewed: I have personally reviewed following labs and imaging studies Basic Metabolic Panel: Recent Labs  Lab 06/29/20 1149 07/02/20 1211 07/03/20 0620 07/04/20 0708  NA 136 134* 139 138  K 3.7 3.5 4.0 4.0  CL 100 102 106 107  CO2 27 25 27 25   GLUCOSE 102 111* 103* 106*  BUN 28* 34* 28* 20  CREATININE 1.64* 1.88* 1.62* 1.35*  CALCIUM 9.0 8.7* 8.3* 8.4*  MG  --   --  2.0 1.9   Liver Function Tests: Recent Labs  Lab 06/29/20 1149 07/02/20 1211  AST 26 22  ALT 13 15  ALKPHOS  --  39  BILITOT 0.5 0.7  PROT 6.9 6.7  ALBUMIN  --  3.7   No results for input(s): LIPASE, AMYLASE in the last 168 hours. No results for input(s): AMMONIA in the last 168 hours. Coagulation Profile: No results for input(s): INR, PROTIME in the last 168 hours. CBC: Recent Labs  Lab 06/29/20 1149 07/02/20 1211 07/03/20 0620 07/04/20 0708  WBC 5.5 6.5 5.4  6.2  NEUTROABS 3,256 4.0  --   --   HGB 12.8* 11.5* 10.6* 10.3*  HCT 39.1 35.2* 32.9* 31.8*  MCV 93.8 94.1 94.8 94.4  PLT 157 154 149* 154   Cardiac Enzymes: No results for input(s): CKTOTAL, CKMB, CKMBINDEX, TROPONINI in the last 168  hours. BNP: Invalid input(s): POCBNP CBG: No results for input(s): GLUCAP in the last 168 hours. HbA1C: No results for input(s): HGBA1C in the last 72 hours. Urine analysis:    Component Value Date/Time   COLORURINE STRAW (A) 07/02/2020 1253   APPEARANCEUR CLEAR 07/02/2020 1253   LABSPEC 1.004 (L) 07/02/2020 1253   PHURINE 5.0 07/02/2020 1253   GLUCOSEU NEGATIVE 07/02/2020 1253   HGBUR NEGATIVE 07/02/2020 1253   [place] 07/02/2020 1253   KETONESUR NEGATIVE 07/02/2020 1253   PROTEINUR NEGATIVE 07/02/2020 1253   UROBILINOGEN 0.2 02/14/2014 1721   NITRITE NEGATIVE 07/02/2020 1253   LEUKOCYTESUR NEGATIVE 07/02/2020 1253   Sepsis Labs: @LABRCNTIP (procalcitonin:4,lacticidven:4) ) Recent Results (from the past 240 hour(s))  SARS Coronavirus 2 by RT PCR (hospital order, performed in [place] hospital lab) Nasopharyngeal Nasopharyngeal Swab     Status: None   Collection Time: 07/02/20  2:12 PM   Specimen: Nasopharyngeal Swab  Result Value Ref Range Status   SARS Coronavirus 2 NEGATIVE NEGATIVE Final    Comment: (NOTE) SARS-CoV-2 target nucleic acids are NOT DETECTED.  The SARS-CoV-2 RNA is generally detectable in upper and lower respiratory specimens during the acute phase of infection. The lowest concentration of SARS-CoV-2 viral copies this assay can detect is 250 copies / mL. A negative result does not preclude SARS-CoV-2 infection and should not be used as the sole basis for treatment or other patient management decisions.  A negative result may occur with improper specimen collection / handling, submission of specimen other than nasopharyngeal swab, presence of viral mutation(s) within the areas targeted by this assay, and inadequate number of viral copies (<250 copies / mL). A negative result must be combined with clinical observations, patient history, and epidemiological information.  Fact Sheet for Patients:    StrictlyIdeas.no  Fact Sheet for Healthcare Providers: BankingDealers.co.za  This test is not yet approved or  cleared by the [place] FDA and has been authorized for detection and/or diagnosis of SARS-CoV-2 by FDA under an Emergency Use Authorization (EUA).  This EUA will remain in effect (meaning this test can be used) for the duration of the COVID-19 declaration under Section 564(b)(1) of the Act, 21 U.S.C. section 360bbb-3(b)(1), unless the authorization is terminated or revoked sooner.  Performed at [facility], [address], [place], [place] [address]   Culture, blood (routine x 2)     Status: None (Preliminary result)   Collection Time: 07/02/20  2:38 PM   Specimen: BLOOD  Result Value Ref Range Status   Specimen Description BLOOD  Final   Special Requests NONE  Final   Culture   Final    NO GROWTH 2 DAYS Performed at [facility], [address] [place], [place] [address]    Report Status PENDING  Incomplete  Culture, blood (routine x 2)     Status: None (Preliminary result)   Collection Time: 07/02/20  2:38 PM   Specimen: BLOOD  Result Value Ref Range Status   Specimen Description BLOOD  Final   Special Requests NONE  Final   Culture   Final    NO GROWTH 2 DAYS Performed at [facility], [address] [place], [place] [address]  Report Status PENDING  Incomplete  MRSA PCR Screening     Status: None   Collection Time: 07/02/20  6:31 PM   Specimen: Nasal Mucosa; Nasopharyngeal  Result Value Ref Range Status   MRSA by PCR NEGATIVE NEGATIVE Final    Comment:        The GeneXpert MRSA Assay (FDA approved for NASAL specimens only), is one component of a comprehensive MRSA colonization surveillance program. It is not intended to diagnose MRSA infection nor to guide or monitor treatment for MRSA infections. Performed at [facility], [address], [place], [place] [address]      Scheduled Meds: .  carvedilol  3.125 mg Oral BID WC  . cholecalciferol  1,000 Units Oral Daily  . lisinopril  2.5 mg Oral Daily  . pantoprazole (PROTONIX) IV  40 mg Intravenous Q12H  . simvastatin  40 mg Oral QHS  . vitamin B-12  500 mcg Oral Daily   Continuous Infusions: .  ceFAZolin (ANCEF) IV 1 g (07/04/20 1807)    Procedures/Studies: DG Chest Portable 1 View  Result Date: 07/02/2020 CLINICAL DATA:  Weakness. EXAM: PORTABLE CHEST 1 VIEW COMPARISON:  None. FINDINGS: A 3 lead pacemaker is noted. Stable cardiomediastinal silhouette. No pneumothorax. No nodules or masses. No focal infiltrates. IMPRESSION: No active disease. Electronically Signed   By: Dorise Bullion III M.D   On: 07/02/2020 13:11   ECHOCARDIOGRAM COMPLETE  Result Date: 07/03/2020    ECHOCARDIOGRAM REPORT   Patient Name:   Jos Yellowhair Date of Exam: 07/03/2020 Medical Rec #:  [id number]     Height:       65.0 in Accession #:    [id number]    Weight:       139.1 lb Date of Birth:  June 20, 1922      BSA:          1.695 m Patient Age:    26 years      BP:           90/42 mmHg Patient Gender: M             HR:           60 bpm. Exam Location:  [facility] Procedure: 2D Echo, Cardiac Doppler and Color Doppler Indications:    Cardiomyopathy  History:        Patient has prior history of Echocardiogram examinations, most                 recent 07/22/2019. CHF and Cardiomyopathy, CAD and Previous                 Myocardial Infarction, Pacemaker, Arrythmias:Atrial                 Fibrillation; Risk Factors:Dyslipidemia.  Sonographer:    Dustin Flock RDCS Referring Phys: [phone] Jadan Rouillard  Sonographer Comments: Patient very thin. IMPRESSIONS  1. Left ventricular ejection fraction, by estimation, is 50%. The left ventricle has low normal function. The left ventricle demonstrates regional wall motion abnormalities (see scoring diagram/findings for description). Left ventricular diastolic parameters are consistent with Grade II diastolic dysfunction  (pseudonormalization).  2. Right ventricular systolic function is normal. The right ventricular size is normal. There is normal pulmonary artery systolic pressure. The estimated right ventricular systolic pressure is 17.7 mmHg.  3. Left atrial size was moderately dilated.  4. The mitral valve is grossly normal. Trivial mitral valve regurgitation.  5. The aortic valve is tricuspid. Aortic valve regurgitation is trivial. Mild aortic valve sclerosis  is present, with no evidence of aortic valve stenosis.  6. The inferior vena cava is normal in size with greater than 50% respiratory variability, suggesting right atrial pressure of 3 mmHg. FINDINGS  Left Ventricle: Left ventricular ejection fraction, by estimation, is 50%. The left ventricle has low normal function. The left ventricle demonstrates regional wall motion abnormalities. The left ventricular internal cavity size was normal in size. There is no left ventricular hypertrophy. Left ventricular diastolic parameters are consistent with Grade II diastolic dysfunction (pseudonormalization).  LV Wall Scoring: The mid and distal anterior septum and apex are hypokinetic. The entire anterior wall, entire lateral wall, entire inferior wall, basal anteroseptal segment, mid inferoseptal segment, and basal inferoseptal segment are normal. Right Ventricle: The right ventricular size is normal. No increase in right ventricular wall thickness. Right ventricular systolic function is normal. There is normal pulmonary artery systolic pressure. The tricuspid regurgitant velocity is 2.46 m/s, and  with an assumed right atrial pressure of 3 mmHg, the estimated right ventricular systolic pressure is 64.4 mmHg. Left Atrium: Left atrial size was moderately dilated. Right Atrium: Right atrial size was normal in size. Pericardium: There is no evidence of pericardial effusion. Mitral Valve: The mitral valve is grossly normal. Mild mitral annular calcification. Trivial mitral valve  regurgitation. Tricuspid Valve: The tricuspid valve is not well visualized. Tricuspid valve regurgitation is trivial. Aortic Valve: The aortic valve is tricuspid. Aortic valve regurgitation is trivial. Mild aortic valve sclerosis is present, with no evidence of aortic valve stenosis. Mild aortic valve annular calcification. Pulmonic Valve: The pulmonic valve was grossly normal. Pulmonic valve regurgitation is trivial. Aorta: The aortic root is normal in size and structure. Venous: The inferior vena cava is normal in size with greater than 50% respiratory variability, suggesting right atrial pressure of 3 mmHg. IAS/Shunts: No atrial level shunt detected by color flow Doppler.  LEFT VENTRICLE PLAX 2D LVIDd:         4.86 cm  Diastology LVIDs:         3.76 cm  LV e' lateral:   8.38 cm/s LV PW:         0.98 cm  LV E/e' lateral: 11.0 LV IVS:        0.88 cm  LV e' medial:    5.33 cm/s LVOT diam:     2.10 cm  LV E/e' medial:  17.3 LV SV:         41 LV SV Index:   24 LVOT Area:     3.46 cm  RIGHT VENTRICLE RV Basal diam:  3.08 cm RV S prime:     11.00 cm/s TAPSE (M-mode): 2.6 cm LEFT ATRIUM             Index       RIGHT ATRIUM           Index LA diam:        3.10 cm 1.83 cm/m  RA Area:     15.00 cm LA Vol (A2C):   38.3 ml 22.59 ml/m RA Volume:   33.30 ml  19.64 ml/m LA Vol (A4C):   78.0 ml 46.01 ml/m LA Biplane Vol: 60.5 ml 35.69 ml/m  AORTIC VALVE LVOT Vmax:   56.80 cm/s LVOT Vmean:  37.800 cm/s LVOT VTI:    0.118 m  AORTA Ao Root diam: 3.30 cm MITRAL VALVE               TRICUSPID VALVE MV Area (PHT): 2.94 cm    TR Peak  grad:   24.2 mmHg MV Decel Time: 258 msec    TR Vmax:        246.00 cm/s MV E velocity: 92.10 cm/s MV A velocity: 19.30 cm/s  SHUNTS MV E/A ratio:  4.77        Systemic VTI:  0.12 m                            Systemic Diam: 2.10 cm Rozann Lesches MD Electronically signed by Rozann Lesches MD Signature Date/Time: 07/03/2020/11:14:35 AM    Final    CUP PACEART REMOTE DEVICE CHECK  Result Date:  07/01/2020 Scheduled remote reviewed. Normal device function.  Known permanent atrial fib, on Page.  Good ventricular rate control Next remote 91 days. Kathy Breach, RN, CCDS, [employer]   Orson Eva, DO  [facility]  If 7PM-7AM, please contact night-coverage [email or web address] Password [email or web address] 07/04/2020, 6:13 PM   LOS: 2 days

## 2020-07-04 NOTE — Consult Note (Addendum)
Maylon Peppers, M.D. Gastroenterology & Hepatology                                           Patient Name: Mark Lane Account #: $[email or web address] @   MRN: [id number] Admission Date: 07/02/2020 Date of Evaluation:  07/04/2020 Time of Evaluation: 2:28 PM  Referring Physician: Orson Eva, MD  Chief Complaint: Fatigue and melena  HPI:  This is a 86 y.o. male with PMH of HLD, CAD complicated by STEMI, systolic and diastolic heart failure,, sick sinus syndrome status post pacemaker placement, atrial fibrillation, hyperlipidemia, who came to the hospital after presenting worsening fatigue and melena.  The patient reports that he had presented for the last 2 weeks generalized fatigue when walking and tiredness.  He endorsed that he is a very active person but he has been more fatigued than usual.  He also noticed his appetite was decreased but he did not present any early satiety.  He also noticed that for the last couple weeks he has presented episodes of melena without rectal bleeding or hematochezia.  Believes he had a bowel movement with this characteristics 2 days ago.  He denies having any nausea, vomiting, fever, chills, hematemesis, abdominal distention, abdominal pain, diarrhea, jaundice, pruritus or weight loss.  Patient reports he has never felt this kind of symptoms in the past.  Notably, the patient was seen in the GI clinic on 06/29/2020 by Laurine Blazer.  The patient had rectal exam which showed brown stool with negative Hemoccult.  He had an order to perform C. difficile testing that was negative.  Gastrointestinal pathogen panel was ordered but is pending.  Patient has never had an EGD in the past.  His last colonoscopy was close to 20 years ago, the patient reported was normal.  In the ED, he had a normal heart rate but his blood pressure was borderline 90/48.  He was afebrile. Labs were remarkable for CBC with a hemoglobin of 11.5, MCV 94, although cell count was normal 6.5 and platelets  154.  CMP showed a BUN of 34, creatinine of 1.88 increased, normal hepatic panel and electrolytes.  Urinalysis was within normal limits.    Due to persistent borderline blood pressure, the patient had serum morning cortisol of 6.2 which was borderline.  He had an ACTH stimulation test which was negative for adrenal insufficiency.  Most recent labs showed hemoglobin of 10.3 with MCV 94 rest of cell lines within normal limits, CMP with improvement of his renal function with creatinine of 1.35 and BUN 20 normal electrolytes.  He had a echocardiogram that showed an ejection fraction of 50% with wall motion abnormalities and grade 2 diastolic dysfunction.  No significant valvulopathy.  Past Medical History: SEE CHRONIC ISSSUES: Past Medical History:  Diagnosis Date  . Coronary artery disease   . Hyperlipidemia    Past Surgical History:  Past Surgical History:  Procedure Laterality Date  . BIV PACEMAKER INSERTION CRT-P N/A 12/22/2018   Procedure: BIV PACEMAKER INSERTION CRT-P;  Surgeon: Evans Lance, MD;  Location: [facility];  Service: Cardiovascular;  Laterality: N/A;  . CARDIAC CATHETERIZATION    . HEMORRHOID SURGERY     removed a hemorrhoid in the 1970s  . KIDNEY STONE SURGERY     remove a kidney stone   Family History:  Family History  Problem Relation Age of Onset  . Heart attack Father  in his 73's  . Cancer Brother        1/2  . Cancer Sister   . Heart Problems Brother 91       pacemaker 2/2   Social History:  Social History   Tobacco Use  . Smoking status: Former Research scientist (life sciences)  . Smokeless tobacco: Never Used  Substance Use Topics  . Alcohol use: No  . Drug use: No    Home Medications:  Prior to Admission medications   Medication Sig Start Date End Date Taking? Authorizing Provider  apixaban (ELIQUIS) 2.5 MG TABS tablet Take 1 tablet (2.5 mg total) by mouth 2 (two) times daily. 06/30/20  Yes Larey Dresser, MD  carvedilol (COREG) 3.125 MG tablet TAKE  (1)   TABLET TWICE A DAY. 08/17/19  Yes Evans Lance, MD  cholecalciferol (VITAMIN D) 1000 UNITS tablet Take 1,000 Units by mouth daily. 2 tablets   Yes [provider]  furosemide (LASIX) 20 MG tablet TAKE 1 TABLET DAILY 07/01/20  Yes Larey Dresser, MD  lisinopril (ZESTRIL) 5 MG tablet Take 1 tablet (5 mg total) by mouth daily. 07/01/20  Yes Larey Dresser, MD  ondansetron (ZOFRAN ODT) 4 MG disintegrating tablet Take 1 tablet (4 mg total) by mouth every 8 (eight) hours as needed for nausea or vomiting. 06/29/20  Yes Laurine Blazer A, PA-C  pantoprazole (PROTONIX) 40 MG tablet Take 1 tablet (40 mg total) by mouth daily. 06/29/20  Yes Minus Liberty, PA-C  simvastatin (ZOCOR) 40 MG tablet Take 1 tablet (40 mg total) by mouth at bedtime. 05/13/20  Yes Larey Dresser, MD  spironolactone (ALDACTONE) 25 MG tablet Take 12.5 mg by mouth daily.   Yes [provider]  furosemide (LASIX) 40 MG tablet Take 62m daily alternating with 265mevery other day Patient not taking: Reported on 07/02/2020 03/11/20   McLarey DresserMD    Inpatient Medications:  Current Facility-Administered Medications:  .  acetaminophen (TYLENOL) tablet 650 mg, 650 mg, Oral, Q6H PRN **OR** acetaminophen (TYLENOL) suppository 650 mg, 650 mg, Rectal, Q6H PRN, Tat, David, MD .  carvedilol (COREG) tablet 3.125 mg, 3.125 mg, Oral, BID WC, Tat, DaShanon BrowMD, 3.125 mg at 07/04/20 0848 .  ceFAZolin (ANCEF) IVPB 1 g/50 mL premix, 1 g, Intravenous, Q12H, Tat, David, MD, Last Rate: 100 mL/hr at 07/04/20 0616, 1 g at 07/04/20 0616 .  cholecalciferol (VITAMIN D3) tablet 1,000 Units, 1,000 Units, Oral, Daily, Tat, David, MD, 1,000 Units at 07/04/20 0848 .  lisinopril (ZESTRIL) tablet 2.5 mg, 2.5 mg, Oral, Daily, Tat, David, MD, 2.5 mg at 07/04/20 1228 .  ondansetron (ZOFRAN) tablet 4 mg, 4 mg, Oral, Q6H PRN **OR** ondansetron (ZOFRAN) injection 4 mg, 4 mg, Intravenous, Q6H PRN, Tat, David, MD .  pantoprazole (PROTONIX) injection  40 mg, 40 mg, Intravenous, Q12H, Tat, DaShanon BrowMD, 40 mg at 07/04/20 0849 .  simvastatin (ZOCOR) tablet 40 mg, 40 mg, Oral, QHS, Tat, DaShanon BrowMD, 40 mg at 07/03/20 2237 .  vitamin B-12 (CYANOCOBALAMIN) tablet 500 mcg, 500 mcg, Oral, Daily, Tat, David, MD, 500 mcg at 07/04/20 0848 Allergies: Patient has no known allergies.  Complete Review of Systems: GENERAL: negative for malaise, night sweats HEENT: No changes in hearing or vision, no nose bleeds or other nasal problems. NECK: Negative for lumps, goiter, pain and significant neck swelling RESPIRATORY: Negative for cough, wheezing CARDIOVASCULAR: Negative for chest pain, leg swelling, palpitations, orthopnea GI: SEE HPI MUSCULOSKELETAL: Negative for joint pain or swelling, back pain, and  muscle pain. SKIN: Negative for lesions, rash PSYCH: Negative for sleep disturbance, mood disorder and recent psychosocial stressors. HEMATOLOGY Negative for prolonged bleeding, bruising easily, and swollen nodes. ENDOCRINE: Negative for cold or heat intolerance, polyuria, polydipsia and goiter. NEURO: negative for tremor, gait imbalance, syncope and seizures. The remainder of the review of systems is noncontributory.  Physical Exam: BP (!) 106/53 (BP Location: Left Arm)   Pulse 70   Temp 97.8 F (36.6 C)   Resp 16   Ht 5' 5"  (1.651 m)   Wt 63.1 kg   SpO2 100%   BMI 23.15 kg/m  GENERAL: The patient is AO x3, in no acute distress. Elder. HEENT: Head is normocephalic and atraumatic. EOMI are intact. Mouth is well hydrated and without lesions.Mucosa and slightly pale. NECK: Supple. No masses LUNGS: Clear to auscultation. No presence of rhonchi/wheezing/rales. Adequate chest expansion HEART: RRR, normal s1 and s2. ABDOMEN: Soft, nontender, no guarding, no peritoneal signs, and nondistended. BS +. No masses. RECTAL EXAM:  Deferred. EXTREMITIES: Without any cyanosis, clubbing, rash, lesions or edema. NEUROLOGIC: AOx3, no focal motor deficit. SKIN: no  jaundice, no rashes   Laboratory Data CBC:     Component Value Date/Time   WBC 6.2 07/04/2020 0708   RBC 3.37 (L) 07/04/2020 0708   HGB 10.3 (L) 07/04/2020 0708   HGB 11.7 (L) 04/30/2019 1206   HCT 31.8 (L) 07/04/2020 0708   HCT 35.5 (L) 04/30/2019 1206   PLT 154 07/04/2020 0708   PLT 157 04/30/2019 1206   MCV 94.4 07/04/2020 0708   MCV 94 04/30/2019 1206   MCH 30.6 07/04/2020 0708   MCHC 32.4 07/04/2020 0708   RDW 13.2 07/04/2020 0708   RDW 12.7 04/30/2019 1206   LYMPHSABS 2.0 07/02/2020 1211   LYMPHSABS 2.8 12/19/2018 1129   MONOABS 0.5 07/02/2020 1211   EOSABS 0.1 07/02/2020 1211   EOSABS 0.3 12/19/2018 1129   BASOSABS 0.0 07/02/2020 1211   BASOSABS 0.1 12/19/2018 1129   COAG:  Lab Results  Component Value Date   INR 1.52 (H) 05/07/2010    BMP:  BMP Latest Ref Rng & Units 07/04/2020 07/03/2020 07/02/2020  Glucose 70 - 99 mg/dL 106(H) 103(H) 111(H)  BUN 8 - 23 mg/dL 20 28(H) 34(H)  Creatinine 0.61 - 1.24 mg/dL 1.35(H) 1.62(H) 1.88(H)  BUN/Creat Ratio 6 - 22 (calc) - - -  Sodium 135 - 145 mmol/L 138 139 134(L)  Potassium 3.5 - 5.1 mmol/L 4.0 4.0 3.5  Chloride 98 - 111 mmol/L 107 106 102  CO2 22 - 32 mmol/L 25 27 25   Calcium 8.9 - 10.3 mg/dL 8.4(L) 8.3(L) 8.7(L)    HEPATIC:  Hepatic Function Latest Ref Rng & Units 07/02/2020 06/29/2020 06/27/2020  Total Protein 6.5 - 8.1 g/dL 6.7 6.9 7.1  Albumin 3.5 - 5.0 g/dL 3.7 - 3.8  AST 15 - 41 U/L 22 26 31   ALT 0 - 44 U/L 15 13 17   Alk Phosphatase 38 - 126 U/L 39 - 39  Total Bilirubin 0.3 - 1.2 mg/dL 0.7 0.5 0.7  Bilirubin, Direct 0.1 - 0.5 mg/dL - - -    CARDIAC:  Lab Results  Component Value Date   CKTOTAL 1,194 (H) 05/08/2010   CKMB (HH) 05/08/2010    198.7 CRITICAL VALUE NOTED.  VALUE IS CONSISTENT WITH PREVIOUSLY REPORTED AND CALLED VALUE.   TROPONINI <0.30 02/14/2014     Imaging: I personally reviewed and interpreted the available imaging.  Assessment & Plan: Mark Lane is a 86 y.o. male with  PMH of HLD,  CAD complicated by STEMI, systolic and diastolic heart failure,, sick sinus syndrome status post pacemaker placement, atrial fibrillation, hyperlipidemia, who came to the hospital after presenting worsening fatigue and melena.  The patient has presented borderline blood pressure during his hospitalization.  He has not presented any overt episode of gastrointestinal bleeding but his hemoglobin has slowly trended down since admission.  He endorsed a previous history of melena but had a negative FOBT recently.  However, given the recurrence of symptoms we will proceed with an EGD tomorrow to rule out any lesions that could have led to bleeding in the past.  We will hold his Eliquis for now to perform EGD tomorrow.  He will continue with his PPI IV twice daily.  We will also follow PCR testing in stool and check his iron stores.  - Repeat CBC qday, transfuse if Hb <8 - Pantoprazole 40 mg q12h IVP - 2 large bore IV lines - Active T/S - Keep NPO after MN - Avoid NSAIDs - Check iron stores - F/u stool PCR testing - Will proceed with EGD tomorrow - Hold Eliquis for now  Harvel Quale, MD Gastroenterology and Hepatology [facility]   Note: Occasional unusual wording and randomly placed punctuation marks may result from the use of speech recognition technology to transcribe this document

## 2020-07-05 ENCOUNTER — Other Ambulatory Visit: Payer: Self-pay

## 2020-07-05 ENCOUNTER — Encounter (HOSPITAL_COMMUNITY)
Admission: EM | Disposition: A | Discharge status: Home / Self Care | Payer: Self-pay | Source: Home / Self Care | Attending: Internal Medicine

## 2020-07-05 ENCOUNTER — Inpatient Hospital Stay (HOSPITAL_COMMUNITY): Payer: PPO | Admitting: Anesthesiology

## 2020-07-05 ENCOUNTER — Encounter (HOSPITAL_COMMUNITY): Payer: Self-pay | Admitting: Internal Medicine

## 2020-07-05 DIAGNOSIS — K449 Diaphragmatic hernia without obstruction or gangrene: Secondary | ICD-10-CM

## 2020-07-05 DIAGNOSIS — K3189 Other diseases of stomach and duodenum: Secondary | ICD-10-CM

## 2020-07-05 DIAGNOSIS — K317 Polyp of stomach and duodenum: Secondary | ICD-10-CM

## 2020-07-05 DIAGNOSIS — R627 Adult failure to thrive: Secondary | ICD-10-CM

## 2020-07-05 HISTORY — PX: ESOPHAGOGASTRODUODENOSCOPY (EGD) WITH PROPOFOL: SHX5813

## 2020-07-05 HISTORY — PX: BIOPSY: SHX5522

## 2020-07-05 LAB — MAGNESIUM: Magnesium: 2 mg/dL (ref 1.7–2.4)

## 2020-07-05 LAB — BASIC METABOLIC PANEL
Anion gap: 7 (ref 5–15)
BUN: 21 mg/dL (ref 8–23)
CO2: 25 mmol/L (ref 22–32)
Calcium: 8.8 mg/dL — ABNORMAL LOW (ref 8.9–10.3)
Chloride: 105 mmol/L (ref 98–111)
Creatinine, Ser: 1.24 mg/dL (ref 0.61–1.24)
GFR calc Af Amer: 57 mL/min — ABNORMAL LOW (ref >60)
GFR calc non Af Amer: 49 mL/min — ABNORMAL LOW (ref >60)
Glucose, Bld: 95 mg/dL (ref 70–99)
Potassium: 3.9 mmol/L (ref 3.5–5.1)
Sodium: 137 mmol/L (ref 135–145)

## 2020-07-05 LAB — C. DIFFICILE GDH AND TOXIN A/B
GDH ANTIGEN: NOT DETECTED
MICRO NUMBER:: 10883265
SPECIMEN QUALITY:: ADEQUATE
TOXIN A AND B: NOT DETECTED

## 2020-07-05 LAB — IRON AND TIBC
Iron: 79 ug/dL (ref 45–182)
Saturation Ratios: 36 % (ref 17.9–39.5)
TIBC: 220 ug/dL — ABNORMAL LOW (ref 250–450)
UIBC: 141 ug/dL

## 2020-07-05 LAB — CBC
HCT: 30.7 % — ABNORMAL LOW (ref 39.0–52.0)
Hemoglobin: 9.9 g/dL — ABNORMAL LOW (ref 13.0–17.0)
MCH: 30.6 pg (ref 26.0–34.0)
MCHC: 32.2 g/dL (ref 30.0–36.0)
MCV: 94.8 fL (ref 80.0–100.0)
Platelets: 163 10*3/uL (ref 150–400)
RBC: 3.24 MIL/uL — ABNORMAL LOW (ref 4.22–5.81)
RDW: 13.1 % (ref 11.5–15.5)
WBC: 7 10*3/uL (ref 4.0–10.5)
nRBC: 0 % (ref 0.0–0.2)

## 2020-07-05 LAB — GASTROINTESTINAL PATHOGEN PANEL PCR

## 2020-07-05 LAB — FERRITIN: Ferritin: 161 ng/mL (ref 24–336)

## 2020-07-05 SURGERY — ESOPHAGOGASTRODUODENOSCOPY (EGD) WITH PROPOFOL
Anesthesia: General

## 2020-07-05 MED ORDER — LACTATED RINGERS IV SOLN: INTRAVENOUS | Status: DC | PRN

## 2020-07-05 MED ORDER — LIDOCAINE HCL (CARDIAC) PF 50 MG/5ML IV SOSY
PREFILLED_SYRINGE | INTRAVENOUS | Status: DC | PRN
  Administered 2020-07-05: 100 mg via INTRAVENOUS

## 2020-07-05 MED ORDER — PROPOFOL 500 MG/50ML IV EMUL
INTRAVENOUS | Status: DC | PRN
  Administered 2020-07-05: 60 mg via INTRAVENOUS
  Administered 2020-07-05: 20 mg via INTRAVENOUS
  Administered 2020-07-05: 10 mg via INTRAVENOUS
  Administered 2020-07-05: 30 mg via INTRAVENOUS

## 2020-07-05 MED ORDER — APIXABAN 2.5 MG PO TABS
2.5000 mg | ORAL_TABLET | Freq: Two times a day (BID) | ORAL | Status: DC
  Filled 2020-07-05: qty 1

## 2020-07-05 MED ORDER — STERILE WATER FOR IRRIGATION IR SOLN
Status: DC | PRN
  Administered 2020-07-05: 1.5 mL

## 2020-07-05 MED ORDER — CYANOCOBALAMIN 500 MCG PO TABS: 500.0000 ug | ORAL_TABLET | Freq: Every day | ORAL | Status: AC

## 2020-07-05 MED ORDER — CEPHALEXIN 500 MG PO CAPS: 500.0000 mg | ORAL_CAPSULE | Freq: Two times a day (BID) | ORAL | 0 refills | Status: DC

## 2020-07-05 MED ORDER — SODIUM CHLORIDE 0.9 % IV SOLN: INTRAVENOUS | Status: DC

## 2020-07-05 MED ORDER — LISINOPRIL 2.5 MG PO TABS: 2.5000 mg | ORAL_TABLET | Freq: Every day | ORAL | Status: DC

## 2020-07-05 NOTE — Progress Notes (Signed)
Remote pacemaker transmission.   

## 2020-07-05 NOTE — TOC Transition Note (Signed)
Transition of Care [facility]) - CM/SW Discharge Note   Patient Details  Name: Mark Lane MRN: [id number] Date of Birth: 1922-10-15  Transition of Care [facility]) CM/SW Contact:  Boneta Lucks, RN Phone Number: 07/05/2020, 4:04 PM   Clinical Narrative:    Patient discharging home today, admitted with Failure to thrive and  CKD.  PT is recommending HHPT. Tim with [facility] accepted the referral for HHPT.   Final next level of care: [place] Barriers to Discharge: Barriers Resolved   Patient Goals and CMS Choice Patient states their goals for this hospitalization and ongoing recovery are:: to go home. CMS Medicare.gov Compare Post Acute Care list provided to:: Patient Choice offered to / list presented to : Patient  Discharge Placement         Patient to be transferred to facility by: Family   Patient and family notified of of transfer: 07/05/20  Discharge Plan and Services      HH Arranged: PT [facility] Agency: [facility] [employer] [facility] [employer] Date [place]: 07/05/20 Time [place]: [phone] Representative spoke with at [place]: Octavia Bruckner

## 2020-07-05 NOTE — Transfer of Care (Signed)
Immediate Anesthesia Transfer of Care Note  Patient: Quanell Ciano  Procedure(s) Performed: ESOPHAGOGASTRODUODENOSCOPY (EGD) WITH PROPOFOL (N/A ) BIOPSY  Patient Location: PACU  Anesthesia Type:General  Level of Consciousness: awake, oriented, drowsy and patient cooperative  Airway & Oxygen Therapy: Patient Spontanous Breathing and Patient connected to nasal cannula oxygen  Post-op Assessment: Report given to RN and Post -op Vital signs reviewed and stable  Post vital signs: Reviewed and stable  Last Vitals:  Vitals Value Taken Time  BP 86/70 07/05/20 1106  Temp    Pulse 67 07/05/20 1108  Resp 19 07/05/20 1108  SpO2 93 % 07/05/20 1108  Vitals shown include unvalidated device data.  Last Pain:  Vitals:   07/05/20 1049  TempSrc:   PainSc: 0-No pain      Patients Stated Pain Goal: 5 (36/43/83 7793)  Complications: No complications documented.

## 2020-07-05 NOTE — Progress Notes (Signed)
Mark Lane, M.D. Gastroenterology & Hepatology   Interval History: No acute events overnight. Patient reports feeling well, he tolerated diet yesterday without any problem.  Has not moved his bowels since yesterday. He reports feeling energetic and stronger.  Denies having any abdominal pain, nausea, vomiting, fever, chills, hematochezia, melena, abdominal distention or syncopal episodes.  Labs today show decrease in his hemoglobin to 9.9 with MCV 94.8.  No alterations in white blood cell count or platelets.  BMP was unremarkable.  Inpatient Medications:  Current Facility-Administered Medications:  .  0.9 %  sodium chloride infusion, , Intravenous, Continuous, Montez Morita, Quillian Quince, MD, Last Rate: 20 mL/hr at 07/05/20 0634, New Bag at 07/05/20 0634 .  [MAR Hold] acetaminophen (TYLENOL) tablet 650 mg, 650 mg, Oral, Q6H PRN **OR** [MAR Hold] acetaminophen (TYLENOL) suppository 650 mg, 650 mg, Rectal, Q6H PRN, Tat, David, MD .  Doug Sou Hold] carvedilol (COREG) tablet 3.125 mg, 3.125 mg, Oral, BID WC, Tat, David, MD, 3.125 mg at 07/04/20 1803 .  [MAR Hold] ceFAZolin (ANCEF) IVPB 1 g/50 mL premix, 1 g, Intravenous, Q12H, Tat, David, MD, Last Rate: 100 mL/hr at 07/05/20 0726, 1 g at 07/05/20 0726 .  [MAR Hold] cholecalciferol (VITAMIN D3) tablet 1,000 Units, 1,000 Units, Oral, Daily, Tat, David, MD, 1,000 Units at 07/04/20 0848 .  [MAR Hold] furosemide (LASIX) tablet 20 mg, 20 mg, Oral, Daily, Tat, David, MD .  Doug Sou Hold] lisinopril (ZESTRIL) tablet 2.5 mg, 2.5 mg, Oral, Daily, Tat, David, MD, 2.5 mg at 07/04/20 1228 .  [MAR Hold] ondansetron (ZOFRAN) tablet 4 mg, 4 mg, Oral, Q6H PRN **OR** [MAR Hold] ondansetron (ZOFRAN) injection 4 mg, 4 mg, Intravenous, Q6H PRN, Tat, David, MD .  Doug Sou Hold] pantoprazole (PROTONIX) injection 40 mg, 40 mg, Intravenous, Q12H, Tat, Shanon Brow, MD, 40 mg at 07/04/20 2212 .  [MAR Hold] simvastatin (ZOCOR) tablet 40 mg, 40 mg, Oral, QHS, Tat, Shanon Brow, MD, 40 mg at  07/04/20 2212 .  [MAR Hold] vitamin B-12 (CYANOCOBALAMIN) tablet 500 mcg, 500 mcg, Oral, Daily, Tat, David, MD, 500 mcg at 07/04/20 0848   I/O   No intake or output data in the 24 hours ending 07/05/20 1034   Physical Exam: Temp:  [97.6 F (36.4 C)-98.3 F (36.8 C)] 97.6 F (36.4 C) (08/31 0915) Pulse Rate:  [63-74] 74 (08/31 0450) Resp:  [15-18] 15 (08/31 0450) BP: (95-105)/(53-70) 103/61 (08/31 0915) SpO2:  [93 %-100 %] 95 % (08/31 0915)  Temp (24hrs), Avg:97.9 F (36.6 C), Min:97.6 F (36.4 C), Max:98.3 F (36.8 C) GENERAL: The patient is AO x3, in no acute distress. Elder. HEENT: Head is normocephalic and atraumatic. EOMI are intact. Mouth is well hydrated and without lesions.Mucosa and slightly pale. NECK: Supple. No masses LUNGS: Clear to auscultation. No presence of rhonchi/wheezing/rales. Adequate chest expansion HEART: RRR, normal s1 and s2. ABDOMEN: Soft, nontender, no guarding, no peritoneal signs, and nondistended. BS +. No masses. RECTAL EXAM:  Deferred. EXTREMITIES: Without any cyanosis, clubbing, rash, lesions or edema. NEUROLOGIC: AOx3, no focal motor deficit. SKIN: no jaundice, no rashes  Laboratory Data: CBC:     Component Value Date/Time   WBC 7.0 07/05/2020 0542   RBC 3.24 (L) 07/05/2020 0542   HGB 9.9 (L) 07/05/2020 0542   HGB 11.7 (L) 04/30/2019 1206   HCT 30.7 (L) 07/05/2020 0542   HCT 35.5 (L) 04/30/2019 1206   PLT 163 07/05/2020 0542   PLT 157 04/30/2019 1206   MCV 94.8 07/05/2020 0542   MCV 94 04/30/2019 1206   MCH  30.6 07/05/2020 0542   MCHC 32.2 07/05/2020 0542   RDW 13.1 07/05/2020 0542   RDW 12.7 04/30/2019 1206   LYMPHSABS 2.0 07/02/2020 1211   LYMPHSABS 2.8 12/19/2018 1129   MONOABS 0.5 07/02/2020 1211   EOSABS 0.1 07/02/2020 1211   EOSABS 0.3 12/19/2018 1129   BASOSABS 0.0 07/02/2020 1211   BASOSABS 0.1 12/19/2018 1129   COAG:  Lab Results  Component Value Date   INR 1.52 (H) 05/07/2010    BMP:  BMP Latest Ref Rng & Units  07/05/2020 07/04/2020 07/03/2020  Glucose 70 - 99 mg/dL 95 106(H) 103(H)  BUN 8 - 23 mg/dL 21 20 28(H)  Creatinine 0.61 - 1.24 mg/dL 1.24 1.35(H) 1.62(H)  BUN/Creat Ratio 6 - 22 (calc) - - -  Sodium 135 - 145 mmol/L 137 138 139  Potassium 3.5 - 5.1 mmol/L 3.9 4.0 4.0  Chloride 98 - 111 mmol/L 105 107 106  CO2 22 - 32 mmol/L 25 25 27   Calcium 8.9 - 10.3 mg/dL 8.8(L) 8.4(L) 8.3(L)    HEPATIC:  Hepatic Function Latest Ref Rng & Units 07/02/2020 06/29/2020 06/27/2020  Total Protein 6.5 - 8.1 g/dL 6.7 6.9 7.1  Albumin 3.5 - 5.0 g/dL 3.7 - 3.8  AST 15 - 41 U/L 22 26 31   ALT 0 - 44 U/L 15 13 17   Alk Phosphatase 38 - 126 U/L 39 - 39  Total Bilirubin 0.3 - 1.2 mg/dL 0.7 0.5 0.7  Bilirubin, Direct 0.1 - 0.5 mg/dL - - -    CARDIAC:  Lab Results  Component Value Date   CKTOTAL 1,194 (H) 05/08/2010   CKMB (HH) 05/08/2010    198.7 CRITICAL VALUE NOTED.  VALUE IS CONSISTENT WITH PREVIOUSLY REPORTED AND CALLED VALUE.   TROPONINI <0.30 02/14/2014      Imaging: I personally reviewed and interpreted the available labs, imaging and endoscopic files.   Assessment/Plan: Mark Lane is a 86 y.o. male with PMH of HLD, CAD complicated by STEMI, systolic and diastolic heart failure,, sick sinus syndrome status post pacemaker placement, atrial fibrillation, hyperlipidemia, who came to the hospital after presenting worsening fatigue and melena.  The patient has presented borderline blood pressure during his hospitalization with negative ACTH test.  He has not presented any overt episode of gastrointestinal bleeding but his hemoglobin has slowly trended down since admission.  He endorsed a previous history of melena but had a negative FOBT recently with normalization of the color of his bowel movements.  Notably, his iron stores showed normal iron and ferritin which make his normocytic anemia unlikely due to gastrointestinal iron losses. However, given the recurrence of symptoms we will proceed with an EGD today to  rule out any lesions that could have led to bleeding in the past.  We will hold his Eliquis for now.  He will continue with his PPI IV twice daily.    - Repeat CBC qday, transfuse if Hb <8 - Pantoprazole 40 mg q12h IVP - 2 large bore IV lines - Active T/S - Keep NPO after MN - Avoid NSAIDs - F/u stool PCR testing - Will proceed with EGD today - Hold Eliquis for now  Mark Peppers, MD Gastroenterology and Hepatology [facility]  Note: Occasional unusual wording and randomly placed punctuation marks may result from the use of speech recognition technology to transcribe this document

## 2020-07-05 NOTE — Brief Op Note (Signed)
07/02/2020 - 07/05/2020  11:13 AM  PATIENT:  Mark Lane  86 y.o. male  PRE-OPERATIVE DIAGNOSIS:  anemia  POST-OPERATIVE DIAGNOSIS:  large hernia, 38/34,  nodularity in hernia and gastric body, gastric polyps  PROCEDURE:  Procedure(s): ESOPHAGOGASTRODUODENOSCOPY (EGD) WITH PROPOFOL (N/A) BIOPSY  SURGEON:  Surgeon(s) and Role:    * Harvel Quale, MD - Primary  Patient underwent EGD today.  Was found to have a 4 cm paraesophageal hiatal hernia with presence of erythematous nodularity in the hiatal hernia site as well as in the gastric body which was extensively biopsied.  There was no presence of hematin or any evidence of recent bleeding.  There were a few gastric polyps.  Otherwise, no other alterations were found in the esophagus and small bowel.   RECOMMENDATIONS - Return patient to hospital ward for ongoing care.  - Resume previous diet.  - Await pathology results.  - Return to GI clinic as previously scheduled.  - Can stop IV PPI, patient can be switched to oral pantoprazole daily. - Can restart Eliquis today.

## 2020-07-05 NOTE — Progress Notes (Signed)
Nsg Discharge Note  Admit Date:  07/02/2020 Discharge date: 07/05/2020   Mark Lane to be D/C'd home per MD order.  AVS completed.  Copy for chart, and copy for patient signed, and dated. Patient/caregiver able to verbalize understanding.  Discharge Medication: Allergies as of 07/05/2020   No Known Allergies     Medication List    TAKE these medications   apixaban 2.5 MG Tabs tablet Commonly known as: ELIQUIS Take 1 tablet (2.5 mg total) by mouth 2 (two) times daily.   carvedilol 3.125 MG tablet Commonly known as: COREG TAKE  (1)  TABLET TWICE A DAY.   cholecalciferol 1000 units tablet Commonly known as: VITAMIN D Take 1,000 Units by mouth daily. 2 tablets   furosemide 20 MG tablet Commonly known as: LASIX TAKE 1 TABLET DAILY What changed: Another medication with the same name was removed. Continue taking this medication, and follow the directions you see here.   lisinopril 2.5 MG tablet Commonly known as: ZESTRIL Take 1 tablet (2.5 mg total) by mouth daily. What changed:   medication strength  how much to take   ondansetron 4 MG disintegrating tablet Commonly known as: Zofran ODT Take 1 tablet (4 mg total) by mouth every 8 (eight) hours as needed for nausea or vomiting.   pantoprazole 40 MG tablet Commonly known as: PROTONIX Take 1 tablet (40 mg total) by mouth daily.   simvastatin 40 MG tablet Commonly known as: ZOCOR Take 1 tablet (40 mg total) by mouth at bedtime.   spironolactone 25 MG tablet Commonly known as: ALDACTONE Take 12.5 mg by mouth daily.   vitamin B-12 500 MCG tablet Commonly known as: CYANOCOBALAMIN Take 1 tablet (500 mcg total) by mouth daily.       Discharge Assessment: Vitals:   07/05/20 1215 07/05/20 1340  BP: 116/68 112/61  Pulse: (!) 59 63  Resp: 19 16  Temp:  97.7 F (36.5 C)  SpO2: 98% 97%   Skin clean, dry and intact without evidence of skin break down, no evidence of skin tears noted. IV catheter discontinued  intact. Site without signs and symptoms of complications - no redness or edema noted at insertion site, patient denies c/o pain - only slight tenderness at site.  Dressing with slight pressure applied.  D/c Instructions-Education: Discharge instructions given to patient/family with verbalized understanding. D/c education completed with patient/family including follow up instructions, medication list, d/c activities limitations if indicated, with other d/c instructions as indicated by MD - patient able to verbalize understanding, all questions fully answered. Patient instructed to return to ED, call 911, or call MD for any changes in condition.  Patient escorted via [place], and D/C home via private auto.  Venita Sheffield, RN 07/05/2020 4:52 PM

## 2020-07-05 NOTE — Op Note (Signed)
[facility] Patient Name: Mark Lane Procedure Date: 07/05/2020 10:32 AM MRN: [id number] Date of Birth: 09-07-1922 Attending MD: Maylon Peppers ,  CSN: [id number] Age: 86 Admit Type: Outpatient Procedure:                Upper GI endoscopy Indications:              Anemia and failure to thrive Providers:                Maylon Peppers, Charlsie Quest. Theda Sers RN, RN, Caprice Kluver, Raphael Gibney, Technician Referring MD:              Medicines:                Monitored Anesthesia Care Complications:            No immediate complications. Estimated Blood Loss:     Estimated blood loss: none. Procedure:                Pre-Anesthesia Assessment:                           - Prior to the procedure, a History and Physical                            was performed, and patient medications, allergies                            and sensitivities were reviewed. The patient's                            tolerance of previous anesthesia was reviewed.                           - The risks and benefits of the procedure and the                            sedation options and risks were discussed with the                            patient. All questions were answered and informed                            consent was obtained.                           - ASA Grade Assessment: II - A patient with mild                            systemic disease.                           After obtaining informed consent, the endoscope was                            passed under direct vision.  Throughout the                            procedure, the patient's blood pressure, pulse, and                            oxygen saturations were monitored continuously. The                            [id number]-H190 ([id number]) was introduced through the                            mouth, and advanced to the second part of duodenum.                            The upper GI endoscopy was accomplished without                             difficulty. The patient tolerated the procedure                            well. Scope In: 10:52:40 AM Scope Out: 11:03:35 AM Total Procedure Duration: 0 hours 10 minutes 55 seconds  Findings:      A 4 cm hiatal hernia was found. The hiatal narrowing was 38 cm from the       incisors. The Z-line was 34 cm from the incisors.      Localized nodular erythematous mucosa was found in the fundus (in hiatal       hernia site) and gastric body. Biopsies were taken with a cold forceps       for histology.      A few 2 to 3 mm sessile polyps were found in the gastric fundus. These       appeared to be fundic gland polyps.      The examined duodenum was normal. Impression:               - 4 cm hiatal hernia.                           - Nodular mucosa in the gastric body. Biopsied.                           - A few gastric polyps.                           - Normal examined duodenum. Moderate Sedation:      Per Anesthesia Care Recommendation:           - Return patient to hospital ward for ongoing care.                           - Resume previous diet.                           - Await pathology results.                           -  Return to GI clinic as previously scheduled.                           - Can stop IV PPI, patient can be switched to oral                            pantoprazole daily.                           - Can restart Eliquis today. Procedure Code(s):        --- Professional ---                           [id number], GC, Esophagogastroduodenoscopy, flexible,                            transoral; with biopsy, single or multiple Diagnosis Code(s):        --- Professional ---                           K44.9, Diaphragmatic hernia without obstruction or                            gangrene                           K31.89, Other diseases of stomach and duodenum                           K31.7, Polyp of stomach and duodenum CPT copyright 2019 American Medical  Association. All rights reserved. The codes documented in this report are preliminary and upon coder review may  be revised to meet current compliance requirements. Maylon Peppers, MD Maylon Peppers,  07/05/2020 11:13:10 AM This report has been signed electronically. Number of Addenda: 0

## 2020-07-05 NOTE — Progress Notes (Signed)
PT Cancellation Note  Patient Details Name: Mark Lane MRN: [id number] DOB: April 09, 1922   Cancelled Treatment:    Reason Eval/Treat Not Completed: Patient at procedure or test/unavailable. Pt off floor to EGD. Will continue to check back as schedule permits.    Tori Hassell [place] PT, DPT 07/05/20, 11:11 AM [phone]

## 2020-07-05 NOTE — Anesthesia Preprocedure Evaluation (Addendum)
Anesthesia Evaluation  Patient identified by MRN, date of birth, ID band Patient awake    Reviewed: NPO status , Patient's Chart, lab work & pertinent test results  History of Anesthesia Complications Negative for: history of anesthetic complications  Airway Mallampati: III  TM Distance: >3 FB Neck ROM: Full    Dental  (+) Loose, Chipped, Missing, Dental Advisory Given,    Pulmonary former smoker,    Pulmonary exam normal breath sounds clear to auscultation       Cardiovascular Exercise Tolerance: Poor + CAD and + Past MI  Normal cardiovascular exam+ dysrhythmias Atrial Fibrillation + pacemaker  Rhythm:Regular Rate:Normal  1. Left ventricular ejection fraction, by estimation, is 50%. The left ventricle has low normal function. The left ventricle demonstrates  regional wall motion abnormalities (see scoring diagram/findings for  description). Left ventricular diastolic  parameters are consistent with Grade II diastolic dysfunction  (pseudonormalization).  2. Right ventricular systolic function is normal. The right ventricular  size is normal. There is normal pulmonary artery systolic pressure. The  estimated right ventricular systolic pressure is 23.7 mmHg.  3. Left atrial size was moderately dilated.  4. The mitral valve is grossly normal. Trivial mitral valve  regurgitation.  5. The aortic valve is tricuspid. Aortic valve regurgitation is trivial.  Mild aortic valve sclerosis is present, with no evidence of aortic valve  stenosis.  6. The inferior vena cava is normal in size with greater than 50% respiratory variability, suggesting right atrial pressure of 3 mmHg.    Neuro/Psych negative psych ROS   GI/Hepatic Neg liver ROS,   Endo/Other  negative endocrine ROS  Renal/GU Renal InsufficiencyRenal disease     Musculoskeletal   Abdominal   Peds  Hematology negative hematology ROS (+)   Anesthesia Other  Findings   Reproductive/Obstetrics                            Anesthesia Physical Anesthesia Plan  ASA: IV  Anesthesia Plan: General   Post-op Pain Management:    Induction: Intravenous  PONV Risk Score and Plan: TIVA  Airway Management Planned: Nasal Cannula, Natural Airway and Simple Face Mask  Additional Equipment:   Intra-op Plan:   Post-operative Plan:   Informed Consent: I have reviewed the patients History and Physical, chart, labs and discussed the procedure including the risks, benefits and alternatives for the proposed anesthesia with the patient or authorized representative who has indicated his/her understanding and acceptance.     Dental advisory given  Plan Discussed with: CRNA  Anesthesia Plan Comments:         Anesthesia Quick Evaluation

## 2020-07-05 NOTE — Plan of Care (Signed)

## 2020-07-05 NOTE — Anesthesia Postprocedure Evaluation (Signed)
Anesthesia Post Note  Patient: Mark Lane  Procedure(s) Performed: ESOPHAGOGASTRODUODENOSCOPY (EGD) WITH PROPOFOL (N/A ) BIOPSY  Patient location during evaluation: PACU Anesthesia Type: General Level of consciousness: awake, oriented, awake and alert and patient cooperative Pain management: pain level controlled Vital Signs Assessment: post-procedure vital signs reviewed and stable Respiratory status: spontaneous breathing, respiratory function stable and patient connected to nasal cannula oxygen Cardiovascular status: blood pressure returned to baseline and stable Postop Assessment: no backache and no headache Anesthetic complications: no   No complications documented.   Last Vitals:  Vitals:   07/05/20 0450 07/05/20 0915  BP: 105/70 103/61  Pulse: 74   Resp: 15   Temp: 36.6 C 36.4 C  SpO2: 98% 95%    Last Pain:  Vitals:   07/05/20 1049  TempSrc:   PainSc: 0-No pain                 Tacy Learn

## 2020-07-06 ENCOUNTER — Encounter (HOSPITAL_COMMUNITY): Payer: Self-pay | Admitting: Gastroenterology

## 2020-07-06 ENCOUNTER — Other Ambulatory Visit: Payer: Self-pay

## 2020-07-06 LAB — SURGICAL PATHOLOGY

## 2020-07-07 ENCOUNTER — Other Ambulatory Visit (HOSPITAL_COMMUNITY): Payer: PPO

## 2020-07-07 LAB — CULTURE, BLOOD (ROUTINE X 2)
Culture: NO GROWTH
Culture: NO GROWTH

## 2020-07-22 ENCOUNTER — Encounter (HOSPITAL_COMMUNITY): Payer: Self-pay

## 2020-07-25 ENCOUNTER — Telehealth (HOSPITAL_COMMUNITY): Payer: Self-pay | Admitting: *Deleted

## 2020-07-25 ENCOUNTER — Other Ambulatory Visit (HOSPITAL_COMMUNITY): Payer: Self-pay | Admitting: Cardiology

## 2020-07-25 NOTE — Telephone Encounter (Signed)
pts wife left VM to confirm appt date and time. I called [phone] as requested and received a busy tone. I also called the mobile number listed in the chart there was no answer and the vm was not set up.

## 2020-07-26 DIAGNOSIS — N200 Calculus of kidney: Secondary | ICD-10-CM | POA: Diagnosis not present

## 2020-07-26 DIAGNOSIS — D649 Anemia, unspecified: Secondary | ICD-10-CM | POA: Diagnosis not present

## 2020-07-26 DIAGNOSIS — K922 Gastrointestinal hemorrhage, unspecified: Secondary | ICD-10-CM | POA: Diagnosis not present

## 2020-07-26 DIAGNOSIS — N1831 Chronic kidney disease, stage 3a: Secondary | ICD-10-CM | POA: Diagnosis not present

## 2020-07-26 DIAGNOSIS — E785 Hyperlipidemia, unspecified: Secondary | ICD-10-CM | POA: Diagnosis not present

## 2020-07-26 DIAGNOSIS — I2510 Atherosclerotic heart disease of native coronary artery without angina pectoris: Secondary | ICD-10-CM | POA: Diagnosis not present

## 2020-07-26 DIAGNOSIS — I129 Hypertensive chronic kidney disease with stage 1 through stage 4 chronic kidney disease, or unspecified chronic kidney disease: Secondary | ICD-10-CM | POA: Diagnosis not present

## 2020-07-26 DIAGNOSIS — I255 Ischemic cardiomyopathy: Secondary | ICD-10-CM | POA: Diagnosis not present

## 2020-07-26 DIAGNOSIS — Z23 Encounter for immunization: Secondary | ICD-10-CM | POA: Diagnosis not present

## 2020-07-26 DIAGNOSIS — R627 Adult failure to thrive: Secondary | ICD-10-CM | POA: Diagnosis not present

## 2020-07-26 DIAGNOSIS — Z950 Presence of cardiac pacemaker: Secondary | ICD-10-CM | POA: Diagnosis not present

## 2020-07-26 DIAGNOSIS — R7301 Impaired fasting glucose: Secondary | ICD-10-CM | POA: Diagnosis not present

## 2020-07-27 ENCOUNTER — Encounter: Payer: Self-pay | Admitting: Internal Medicine

## 2020-07-27 ENCOUNTER — Other Ambulatory Visit: Payer: Self-pay

## 2020-07-27 ENCOUNTER — Ambulatory Visit: Payer: PPO | Admitting: Internal Medicine

## 2020-07-27 VITALS — BP 120/52 | HR 81 | Ht 65.0 in | Wt 152.0 lb

## 2020-07-27 DIAGNOSIS — I442 Atrioventricular block, complete: Secondary | ICD-10-CM | POA: Diagnosis not present

## 2020-07-27 DIAGNOSIS — I4819 Other persistent atrial fibrillation: Secondary | ICD-10-CM

## 2020-07-27 DIAGNOSIS — Z950 Presence of cardiac pacemaker: Secondary | ICD-10-CM | POA: Diagnosis not present

## 2020-07-27 DIAGNOSIS — I255 Ischemic cardiomyopathy: Secondary | ICD-10-CM

## 2020-07-27 NOTE — Progress Notes (Signed)
HPI Mark Lane returns today for ongoing evaluation and management of CHB, CAD, and atrial fib. He is s/p biv PPM insertion due to all of the above and advanced age. He has done well in the interim. He has class 2 symptoms. He does not have palpitations. No syncope and no edema. No Known Allergies   Current Outpatient Medications  Medication Sig Dispense Refill  . apixaban (ELIQUIS) 2.5 MG TABS tablet Take 1 tablet (2.5 mg total) by mouth 2 (two) times daily. 30 tablet 6  . carvedilol (COREG) 3.125 MG tablet TAKE  (1)  TABLET TWICE A DAY. 180 tablet 3  . cephALEXin (KEFLEX) 500 MG capsule Take 1 capsule (500 mg total) by mouth 2 (two) times daily. 8 capsule 0  . cholecalciferol (VITAMIN D) 1000 UNITS tablet Take 1,000 Units by mouth daily. 2 tablets    . furosemide (LASIX) 20 MG tablet TAKE 1 TABLET DAILY 90 tablet 1  . lisinopril (ZESTRIL) 2.5 MG tablet Take 1 tablet (2.5 mg total) by mouth daily.    . ondansetron (ZOFRAN ODT) 4 MG disintegrating tablet Take 1 tablet (4 mg total) by mouth every 8 (eight) hours as needed for nausea or vomiting. 20 tablet 0  . pantoprazole (PROTONIX) 40 MG tablet Take 1 tablet (40 mg total) by mouth daily. 30 tablet 3  . simvastatin (ZOCOR) 40 MG tablet Take 1 tablet (40 mg total) by mouth at bedtime. 90 tablet 3  . spironolactone (ALDACTONE) 25 MG tablet TAKE (1/2) TABLET DAILY. 45 tablet 3  . vitamin B-12 (CYANOCOBALAMIN) 500 MCG tablet Take 1 tablet (500 mcg total) by mouth daily.     No current facility-administered medications for this visit.     Past Medical History:  Diagnosis Date  . Coronary artery disease   . History of kidney stones    years ago  . Hyperlipidemia     ROS:   All systems reviewed and negative except as noted in the HPI.   Past Surgical History:  Procedure Laterality Date  . BIOPSY  07/05/2020   Procedure: BIOPSY;  Surgeon: Harvel Quale, MD;  [facility];  Service: Gastroenterology;;  .  BIV PACEMAKER INSERTION CRT-P N/A 12/22/2018   Procedure: BIV PACEMAKER INSERTION CRT-P;  Surgeon: Evans Lance, MD;  Location: [place] [facility];  Service: Cardiovascular;  Laterality: N/A;  . CARDIAC CATHETERIZATION    . ESOPHAGOGASTRODUODENOSCOPY (EGD) WITH PROPOFOL N/A 07/05/2020   Procedure: ESOPHAGOGASTRODUODENOSCOPY (EGD) WITH PROPOFOL;  Surgeon: Harvel Quale, MD;  [facility];  Service: Gastroenterology;  Laterality: N/A;  . HEMORRHOID SURGERY     removed a hemorrhoid in the 1970s  . KIDNEY STONE SURGERY     remove a kidney stone     Family History  Problem Relation Age of Onset  . Heart attack Father        in his 31's  . Cancer Brother        1/2  . Cancer Sister   . Heart Problems Brother 16       pacemaker 2/2     Social History   Socioeconomic History  . Marital status: Married    Spouse name: Not on file  . Number of children: Not on file  . Years of education: Not on file  . Highest education level: Not on file  Occupational History  . Not on file  Tobacco Use  . Smoking status: Former Research scientist (life sciences)  . Smokeless tobacco: Never  Used  Substance and Sexual Activity  . Alcohol use: No  . Drug use: No  . Sexual activity: Not on file  Other Topics Concern  . Not on file  Social History Narrative   He is married, has 1-son. He does not smoke, or use alcohol   Social Determinants of Health   Financial Resource Strain:   . Difficulty of Paying Living Expenses: Not on file  Food Insecurity:   . Worried About Charity fundraiser in the Last Year: Not on file  . Ran Out of Food in the Last Year: Not on file  Transportation Needs:   . Lack of Transportation (Medical): Not on file  . Lack of Transportation (Non-Medical): Not on file  Physical Activity:   . Days of Exercise per Week: Not on file  . Minutes of Exercise per Session: Not on file  Stress:   . Feeling of Stress : Not on file  Social Connections:   . Frequency of  Communication with Friends and Family: Not on file  . Frequency of Social Gatherings with Friends and Family: Not on file  . Attends Religious Services: Not on file  . Active Member of Clubs or Organizations: Not on file  . Attends Archivist Meetings: Not on file  . Marital Status: Not on file  Intimate Partner Violence:   . Fear of Current or Ex-Partner: Not on file  . Emotionally Abused: Not on file  . Physically Abused: Not on file  . Sexually Abused: Not on file     BP (!) 120/52   Pulse 81   Ht 5\' 5"  (1.651 m)   Wt 152 lb (68.9 kg)   SpO2 90%   BMI 25.29 kg/m   Physical Exam:  Well appearing NAD HEENT: Unremarkable Neck:  No JVD, no thyromegally Lymphatics:  No adenopathy Back:  No CVA tenderness Lungs:  Clear with no wheezes HEART:  Regular rate rhythm, no murmurs, no rubs, no clicks Abd:  soft, positive bowel sounds, no organomegally, no rebound, no guarding Ext:  2 plus pulses, no edema, no cyanosis, no clubbing Skin:  No rashes no nodules Neuro:  CN II through XII intact, motor grossly intact  EKG Atrial fib with biv pacing  DEVICE  Normal device function.  See PaceArt for details.   Assess/Plan: 1. Atrial fib - his VR is well controlled. He is tolerating his current meds. 2. Hypotension - he was in the ED and had his meds reduced. His bp is back to normal. 3. Chronic systolic heart failure - his symptoms are class 2. He will continue his current meds. 4. CHB - he is asymptomatic, s/p PPM insertion. His medtronic biv PPM is working normally.  Mark Overlie Hutchinson Isenberg,MD

## 2020-07-27 NOTE — Patient Instructions (Signed)

## 2020-07-28 ENCOUNTER — Encounter (INDEPENDENT_AMBULATORY_CARE_PROVIDER_SITE_OTHER): Payer: Self-pay | Admitting: Gastroenterology

## 2020-07-28 ENCOUNTER — Ambulatory Visit (INDEPENDENT_AMBULATORY_CARE_PROVIDER_SITE_OTHER): Payer: PPO | Admitting: Gastroenterology

## 2020-07-28 DIAGNOSIS — R634 Abnormal weight loss: Secondary | ICD-10-CM | POA: Diagnosis not present

## 2020-07-28 DIAGNOSIS — K921 Melena: Secondary | ICD-10-CM

## 2020-07-28 DIAGNOSIS — R197 Diarrhea, unspecified: Secondary | ICD-10-CM | POA: Diagnosis not present

## 2020-07-28 DIAGNOSIS — R110 Nausea: Secondary | ICD-10-CM

## 2020-07-28 MED ORDER — PANTOPRAZOLE SODIUM 40 MG PO TBEC: 40.0000 mg | DELAYED_RELEASE_TABLET | Freq: Every day | ORAL | 3 refills | Status: DC

## 2020-07-28 NOTE — Patient Instructions (Signed)
Continue protonix daily 30 min before breakfast. Please call w/ any questions or new symptoms

## 2020-07-28 NOTE — Progress Notes (Signed)
Patient profile: Mark Lane is a 86 y.o. male seen for hospital follow-up.  He was admitted on 07/02/2020-07/05/20, had complaints of fatigue, diarrhea, drop in his hemoglobin.  He had an endoscopy while admitted.  Complex past medical history of hyperlipidemia, CAD complicated by STEMI, CHF, pacemaker, atrial fib.  History of Present Illness: Mark Lane is seen today for evaluation.  He is accompanied by son.  He reports he is doing remarkably well.  He is typically having 1-2 bowel movements a day.  No blood in stool or lower abdominal pain.  Denies constipation diarrhea.  He is not having any GERD symptoms on Protonix 40 mg.  No nausea vomiting or dysphagia.  His appetite is drastically improved since he was discharged from the hospital and he has been able to regain 13 pounds.  He hopes to get back to around 165 pounds.  He is accompanied by his son who helps with history.  Wt Readings from Last 3 Encounters:  07/28/20 150 lb 6.4 oz (68.2 kg)  07/27/20 152 lb (68.9 kg)  07/02/20 139 lb 1.8 oz (63.1 kg)    Last Endoscopy: 06/2020-- 4 cm hiatal hernia.  - Nodular mucosa in the gastric body. Biopsied. - A few gastric polyps. - Normal examined duodenum. Pathology showed presence of atrophy and intestinal metaplasia but negative for dysplasia, lymphoproliferative alterations or amyloidosis. These changes are in a specific and do not warrant any further testing given the patient's age and the fact that they do not explain the patient's symptoms. No need to repeat EGD.   Past Medical History:  Past Medical History:  Diagnosis Date  . Coronary artery disease   . History of kidney stones    years ago  . Hyperlipidemia     Problem List: Patient Active Problem List   Diagnosis Date Noted  . Hypotension 07/02/2020  . Failure to thrive in adult 07/02/2020  . CKD (chronic kidney disease) stage 3, GFR 30-59 ml/min 07/02/2020  . Right forearm cellulitis   . Biventricular cardiac  pacemaker in situ 07/22/2019  . Persistent atrial fibrillation ([place]) 07/22/2019  . Complete heart block ([place]) 12/22/2018  . Cardiomyopathy, ischemic 12/30/2012  . ACUT MI OTH LAT WALL SUBSQT EPIS CARE 05/29/2010  . 1st degree AV block 02/23/2009  . Hyperlipidemia 02/22/2009  . Coronary atherosclerosis 02/22/2009    Past Surgical History: Past Surgical History:  Procedure Laterality Date  . BIOPSY  07/05/2020   Procedure: BIOPSY;  Surgeon: Harvel Quale, MD;  [facility];  Service: Gastroenterology;;  . BIV PACEMAKER INSERTION CRT-P N/A 12/22/2018   Procedure: BIV PACEMAKER INSERTION CRT-P;  Surgeon: Evans Lance, MD;  Location: [place] [facility];  Service: Cardiovascular;  Laterality: N/A;  . CARDIAC CATHETERIZATION    . ESOPHAGOGASTRODUODENOSCOPY (EGD) WITH PROPOFOL N/A 07/05/2020   Procedure: ESOPHAGOGASTRODUODENOSCOPY (EGD) WITH PROPOFOL;  Surgeon: Harvel Quale, MD;  [facility];  Service: Gastroenterology;  Laterality: N/A;  . HEMORRHOID SURGERY     removed a hemorrhoid in the 1970s  . KIDNEY STONE SURGERY     remove a kidney stone    Allergies: No Known Allergies    Home Medications:  Current Outpatient Medications:  .  apixaban (ELIQUIS) 2.5 MG TABS tablet, Take 1 tablet (2.5 mg total) by mouth 2 (two) times daily., Disp: 30 tablet, Rfl: 6 .  carvedilol (COREG) 3.125 MG tablet, TAKE  (1)  TABLET TWICE A DAY., Disp: 180 tablet, Rfl: 3 .  cephALEXin (KEFLEX) 500  MG capsule, Take 1 capsule (500 mg total) by mouth 2 (two) times daily., Disp: 8 capsule, Rfl: 0 .  cholecalciferol (VITAMIN D) 1000 UNITS tablet, Take 1,000 Units by mouth daily. 2 tablets, Disp: , Rfl:  .  furosemide (LASIX) 20 MG tablet, TAKE 1 TABLET DAILY, Disp: 90 tablet, Rfl: 1 .  lisinopril (ZESTRIL) 2.5 MG tablet, Take 1 tablet (2.5 mg total) by mouth daily., Disp: , Rfl:  .  ondansetron (ZOFRAN ODT) 4 MG disintegrating tablet, Take 1 tablet (4 mg total) by  mouth every 8 (eight) hours as needed for nausea or vomiting., Disp: 20 tablet, Rfl: 0 .  pantoprazole (PROTONIX) 40 MG tablet, Take 1 tablet (40 mg total) by mouth daily. Take 30 min before breakfast, Disp: 90 tablet, Rfl: 3 .  simvastatin (ZOCOR) 40 MG tablet, Take 1 tablet (40 mg total) by mouth at bedtime., Disp: 90 tablet, Rfl: 3 .  spironolactone (ALDACTONE) 25 MG tablet, TAKE (1/2) TABLET DAILY., Disp: 45 tablet, Rfl: 3 .  vitamin B-12 (CYANOCOBALAMIN) 500 MCG tablet, Take 1 tablet (500 mcg total) by mouth daily., Disp: , Rfl:    Family History: family history includes Cancer in his brother and sister; Heart Problems (age of onset: 91) in his brother; Heart attack in his father.    Social History:   reports that he has quit smoking. He has never used smokeless tobacco. He reports that he does not drink alcohol and does not use drugs.   Review of Systems: Constitutional: Denies weight loss/weight gain  Eyes: No changes in vision. ENT: No oral lesions, sore throat.  GI: see HPI.  Heme/Lymph: No easy bruising.  CV: No chest pain.  GU: No hematuria.  Integumentary: No rashes.  Neuro: No headaches.  Psych: No depression/anxiety.  Endocrine: No heat/cold intolerance.  Allergic/Immunologic: No urticaria.  Resp: No cough, SOB.  Musculoskeletal: No joint swelling.    Physical Examination: BP 115/69 (BP Location: Right Arm, Patient Position: Sitting, Cuff Size: Normal)   Pulse 84   Temp (!) 96.8 F (36 C) (Temporal)   Ht 5\' 5"  (1.651 m)   Wt 150 lb 6.4 oz (68.2 kg)   BMI 25.03 kg/m  Gen: NAD, alert and oriented x 4 HEENT: PEERLA, EOMI, Neck: supple, no JVD Chest: CTA bilaterally, no wheezes, crackles, or other adventitious sounds CV: RRR, no m/g/c/r Abd: soft, NT, ND, +BS in all four quadrants; no HSM, guarding, ridigity, or rebound tenderness Ext: no edema, well perfused with 2+ pulses, Skin: no rash or lesions noted on observed skin Lymph: no noted LAD  Data  Reviewed: 07/05/20-Hgb 9.9, ferritin 161, Mag 2.0, BMP w/ Ca 8.8, GFR 49   Assessment/Plan: Mr. Gaymon is a 86 y.o. male    Nyjah was seen today for hospitalization follow-up.  Diagnoses and all orders for this visit:  Loss of weight -     pantoprazole (PROTONIX) 40 MG tablet; Take 1 tablet (40 mg total) by mouth daily. Take 30 min before breakfast  Diarrhea, unspecified type -     pantoprazole (PROTONIX) 40 MG tablet; Take 1 tablet (40 mg total) by mouth daily. Take 30 min before breakfast  Nausea without vomiting -     pantoprazole (PROTONIX) 40 MG tablet; Take 1 tablet (40 mg total) by mouth daily. Take 30 min before breakfast  Melena -     pantoprazole (PROTONIX) 40 MG tablet; Take 1 tablet (40 mg total) by mouth daily. Take 30 min before breakfast     1.  Diarrhea/melena/weight loss-seen initially as an outpatient in August 2021, plan work-up and stool studies negative for infection.  Had endoscopy during admission.  All symptoms have resolved.  he has regained 13 pounds since his discharge from the hospital.  He is feeling well and eating well without any pain.  He will continue PPI.  He will contact us with any return symptoms.  2.  Anemia-hemoglobin at discharge was 9.9.  He had labs at his PCP 2 days ago and will request these instead of redrawing.  No further episodes of melena.  Hemoccult negative  Follow-up 1 year if doing well.  He will call sooner with any return of symptoms   I personally performed the service, non-incident to. (WP)  Laurine Blazer, [facility]

## 2020-08-01 ENCOUNTER — Ambulatory Visit (HOSPITAL_COMMUNITY)
Admission: RE | Admit: 2020-08-01 | Discharge: 2020-08-01 | Disposition: A | Discharge status: Home / Self Care | Payer: PPO | Source: Ambulatory Visit | Attending: Cardiology | Admitting: Cardiology

## 2020-08-01 ENCOUNTER — Encounter (HOSPITAL_COMMUNITY): Payer: Self-pay

## 2020-08-01 ENCOUNTER — Other Ambulatory Visit: Payer: Self-pay

## 2020-08-01 VITALS — BP 108/62 | HR 82 | Ht 65.0 in | Wt 147.4 lb

## 2020-08-01 DIAGNOSIS — I2510 Atherosclerotic heart disease of native coronary artery without angina pectoris: Secondary | ICD-10-CM | POA: Diagnosis not present

## 2020-08-01 DIAGNOSIS — I482 Chronic atrial fibrillation, unspecified: Secondary | ICD-10-CM | POA: Diagnosis not present

## 2020-08-01 DIAGNOSIS — E785 Hyperlipidemia, unspecified: Secondary | ICD-10-CM | POA: Diagnosis not present

## 2020-08-01 DIAGNOSIS — I252 Old myocardial infarction: Secondary | ICD-10-CM | POA: Diagnosis not present

## 2020-08-01 DIAGNOSIS — Z7901 Long term (current) use of anticoagulants: Secondary | ICD-10-CM | POA: Insufficient documentation

## 2020-08-01 DIAGNOSIS — N189 Chronic kidney disease, unspecified: Secondary | ICD-10-CM | POA: Diagnosis not present

## 2020-08-01 DIAGNOSIS — I255 Ischemic cardiomyopathy: Secondary | ICD-10-CM | POA: Insufficient documentation

## 2020-08-01 DIAGNOSIS — I5022 Chronic systolic (congestive) heart failure: Secondary | ICD-10-CM | POA: Diagnosis not present

## 2020-08-01 DIAGNOSIS — Z79899 Other long term (current) drug therapy: Secondary | ICD-10-CM | POA: Diagnosis not present

## 2020-08-01 DIAGNOSIS — Z87891 Personal history of nicotine dependence: Secondary | ICD-10-CM | POA: Diagnosis not present

## 2020-08-01 LAB — CUP PACEART INCLINIC DEVICE CHECK
Battery Remaining Longevity: 77 mo
Battery Voltage: 2.96 V
Brady Statistic RA Percent Paced: 0.93 %
Brady Statistic RV Percent Paced: 98.01 %
Date Time Interrogation Session: 20210922164502
Implantable Lead Implant Date: 20200217
Implantable Lead Implant Date: 20200217
Implantable Lead Implant Date: 20200217
Implantable Lead Location: 753858
Implantable Lead Location: 753859
Implantable Lead Location: 753860
Implantable Lead Model: 4398
Implantable Lead Model: 5076
Implantable Lead Model: 5076
Implantable Pulse Generator Implant Date: 20200217
Lead Channel Impedance Value: 247 Ohm
Lead Channel Impedance Value: 304 Ohm
Lead Channel Impedance Value: 304 Ohm
Lead Channel Impedance Value: 323 Ohm
Lead Channel Impedance Value: 361 Ohm
Lead Channel Impedance Value: 361 Ohm
Lead Channel Impedance Value: 380 Ohm
Lead Channel Impedance Value: 418 Ohm
Lead Channel Impedance Value: 475 Ohm
Lead Channel Impedance Value: 494 Ohm
Lead Channel Impedance Value: 513 Ohm
Lead Channel Impedance Value: 551 Ohm
Lead Channel Impedance Value: 551 Ohm
Lead Channel Impedance Value: 608 Ohm
Lead Channel Pacing Threshold Amplitude: 0.5 V
Lead Channel Pacing Threshold Amplitude: 1.25 V
Lead Channel Pacing Threshold Amplitude: 1.375 V
Lead Channel Pacing Threshold Pulse Width: 0.4 ms
Lead Channel Pacing Threshold Pulse Width: 0.4 ms
Lead Channel Pacing Threshold Pulse Width: 1 ms
Lead Channel Sensing Intrinsic Amplitude: 0.375 mV
Lead Channel Sensing Intrinsic Amplitude: 0.5 mV
Lead Channel Sensing Intrinsic Amplitude: 15 mV
Lead Channel Sensing Intrinsic Amplitude: 15 mV
Lead Channel Setting Pacing Amplitude: 2 V
Lead Channel Setting Pacing Amplitude: 2.5 V
Lead Channel Setting Pacing Amplitude: 2.5 V
Lead Channel Setting Pacing Pulse Width: 0.4 ms
Lead Channel Setting Pacing Pulse Width: 1 ms
Lead Channel Setting Sensing Sensitivity: 1.2 mV

## 2020-08-01 LAB — BASIC METABOLIC PANEL
Anion gap: 9 (ref 5–15)
BUN: 18 mg/dL (ref 8–23)
CO2: 28 mmol/L (ref 22–32)
Calcium: 9.4 mg/dL (ref 8.9–10.3)
Chloride: 103 mmol/L (ref 98–111)
Creatinine, Ser: 1.99 mg/dL — ABNORMAL HIGH (ref 0.61–1.24)
GFR calc Af Amer: 32 mL/min — ABNORMAL LOW (ref >60)
GFR calc non Af Amer: 28 mL/min — ABNORMAL LOW (ref >60)
Glucose, Bld: 102 mg/dL — ABNORMAL HIGH (ref 70–99)
Potassium: 4.4 mmol/L (ref 3.5–5.1)
Sodium: 140 mmol/L (ref 135–145)

## 2020-08-01 NOTE — Progress Notes (Signed)
Patient ID: Mark Lane, male   DOB: 1922-08-17, 86 y.o.   MRN: [id number] PCP: Dr. Forde Dandy Cardiology: Dr. Aundra Dubin  86 y.o.with history of CAD and ischemic cardiomyopathy presents for followup of CAD and CHF.  He had an anterolateral STEMI in 7/11 with ostial occlusion of a large 2nd diagonal that was treated with BMS.  EF immediately post-MI was 35%.  He has also been noted to have Mobitz Type I 2nd degree AV block at times.  Last echo in 7/14 showed EF 30-35% with septal/apical akinesis and anterior hypokinesis.    In 2/20, he developed complete heart block and had implantation of a Medtronic CRT-P device.  He subsequently developed atrial fibrillation which has become chronic.   Echo was done in 9/20, showing EF 45-50%, mild LVH, normal RV.   He was recently seen by Dr. Aundra Dubin on 06/30/20 and had noted increased fatigue and weakness. Just prior to this, his wife was in the hospital for UTI. After this happened, he developed progressive weakness and fatigue and had had > 13 lb wt loss. Was tiring easily but no significant dyspnea. Device interrogation showed chronic rate controlled afib, w/ stable thoracic impedance and 98.5% BiV Pacing.  There was concern for possible GIB but w/u was negative. Hgb had not been significantly low.  He saw GI and stool was heme negative. Iron studies were WNL. When he saw Dr. Aundra Dubin, his BP was low in the 20N systolic. Meds were reduced. Lasix reduced to 20 mg daily (down from alternating dose of 40/20 daily) and Lisinopril reduced down to 5 mg daily. There was question regarding potential Takotsubo episode with wife's illness (markedly worse after she went into the hospital), thus repeat echo was recommended, however LVEF was low normal at 50%. RV ok.   3 days after his last clinic visit, on 8/29, he was admitted to the hospital for increased weakness, melanotic stools and failure to thrive. He also had rt forearm erythema concerning for cellulitis and this was treated w/  abx. Blood cultures were negative. UA also negative for UTI.   BP was soft in the 47S systolic. BMP showed slight increase in SCr to 1.88 (basline 1.4-1.7). He was hydrated w/ IVFs and HF meds held initially. Lisinopril ultimately restarted but at a lower dose of 2.5 mg daily. Lasix also restarted at 20 mg daily. Hgb remained stable ~11 however GI was consulted for w/u given melanotic stools.  07/05/20 EGD showed4 cm paraesophageal hiatal hernia with presence of erythematous nodularity in the hiatal hernia site as well as in the gastric body which was extensively biopsied. There was no presence of hematin or any evidence of recent bleeding; few gastric polyps. Pathology benign, showing patchy chronic gastritis. [place] red stain showed no evidence of amyloidosis.   He returns to clinic today for f/u. Here w/ his son. He is feeling better. Less weak. Was able to ambulate into clinic w/o assistance. Was in a wheelchair last visit. BP today is better 108/62. His wt however is up 5 lb from previous clinic visit and his Optivol/ impedence suggest fluid overload. Impedence has been down below reference curve since ~Aug 30. Fluid index progressively trended up and crossed threshold ~1 week ago. He denies any significant increased dyspnea.  Interrogation also show chronic afib. He has had occasional RVR w/ rates in the 120s. Resting HR currently in the 80s    Medtronic device interrogation: atrial fibrillation chronically,  ~100 BiV pacing. Optivol/ Index c/w fluid overload.  ECG: not performed   Labs (6/13): LDL 68, HDL 40 Labs (2/14): K 3.9, creatinine 1.2 Labs (8/14): K 4.1, creatinine 1.4 Labs (12/14): creatinine 1.3 Labs (4/15): K 4.9, creatinine 1.92, BNP 494.6 Labs (9/15): K 3.9, creatinine 1.4, LDL 133, HDL 43 Labs (3/16): K 3.9, creatinine 1.22, LDL 76 Labs (10/16): K 3.8, creatinine 1.15 Labs (3/18): K 3.4, creatinine 1.22 Labs (7/18): LDL 43, HDL 43, LFTs normal Labs (10/18): K 4.1, creatinine  1.17, BNP 298 Labs (6/20): K 4.4, creatinine 1.27, hgb 11.7 Labs (9/20): K 4, creatinine 1.34, LDL 60 Labs (12/20): K 4.3, creatinine 1.64 Labs (8/21): hgb 12.8, K 3.7, creatinine 1.64, LFTs normal, TSH normal  PMH: 1. Hyperlipidemia 2. Nephrolithiasis 3. CAD: Anterolateral STEMI in 7/11.  LHC with ostial total occlusion of a large D2, dominant LCx, small RCA.  Patient had BMS to D2.  4. Complete heart block: Has Medtronic CRT-P device.  5. Ischemic cardiomyopathy: Echo (7/11) with EF 35% and peri-apical akinesis, grade II diastolic dysfunction, normal RV size and systolic function.  Echo (7/14) with EF 30-35%, septal/apical akinesis, anterior hypokinesis, mild MR/AI.  - Echo (9/20): EF 45-50%, mild LVH, normal RV.  6. CKD 7. Atrial fibrillation: Chronic.   SH: Married, lives in [place], prior tobacco.  Has a farm, grows peaches.   FH: CAD  ROS: All systems reviewed and negative except as per HPI  Current Outpatient Medications  Medication Sig Dispense Refill  . apixaban (ELIQUIS) 2.5 MG TABS tablet Take 1 tablet (2.5 mg total) by mouth 2 (two) times daily. 30 tablet 6  . carvedilol (COREG) 3.125 MG tablet TAKE  (1)  TABLET TWICE A DAY. 180 tablet 3  . cephALEXin (KEFLEX) 500 MG capsule Take 1 capsule (500 mg total) by mouth 2 (two) times daily. 8 capsule 0  . cholecalciferol (VITAMIN D) 1000 UNITS tablet Take 1,000 Units by mouth daily. 2 tablets    . furosemide (LASIX) 20 MG tablet TAKE 1 TABLET DAILY 90 tablet 1  . lisinopril (ZESTRIL) 2.5 MG tablet Take 1 tablet (2.5 mg total) by mouth daily.    . ondansetron (ZOFRAN ODT) 4 MG disintegrating tablet Take 1 tablet (4 mg total) by mouth every 8 (eight) hours as needed for nausea or vomiting. 20 tablet 0  . pantoprazole (PROTONIX) 40 MG tablet Take 1 tablet (40 mg total) by mouth daily. Take 30 min before breakfast 90 tablet 3  . simvastatin (ZOCOR) 40 MG tablet Take 1 tablet (40 mg total) by mouth at bedtime. 90 tablet 3  .  spironolactone (ALDACTONE) 25 MG tablet TAKE (1/2) TABLET DAILY. 45 tablet 3  . vitamin B-12 (CYANOCOBALAMIN) 500 MCG tablet Take 1 tablet (500 mcg total) by mouth daily.     No current facility-administered medications for this encounter.   BP 108/62 (BP Location: Left Arm, Patient Position: Sitting, Cuff Size: Normal)   Pulse 82   Ht 5\' 5"  (1.651 m)   Wt 66.9 kg   SpO2 98%   BMI 24.53 kg/m   PHYSICAL EXAM: General:  Well appearing elderly male. No respiratory difficulty HEENT: normal Neck: supple. no JVD. Carotids 2+ bilat; no bruits. No lymphadenopathy or thyromegaly appreciated. Cor: PMI nondisplaced. Irregularly irregular rhythm, regular rate. No rubs, gallops or murmurs. Lungs: clear Abdomen: soft, nontender, nondistended. No hepatosplenomegaly. No bruits or masses. Good bowel sounds. Extremities: no cyanosis, clubbing, rash, edema Neuro: alert & oriented x 3, cranial nerves grossly intact. moves all 4 extremities w/o difficulty. Affect pleasant.   Assessment/Plan:  1. CAD: Stable w/o CP. Continue ASA 81,  blocker and statin.  2. Chronic systolic CHF: Ischemic cardiomyopathy. EF 45-50% (improved) on echo in 9/20. Now with Medtronic CRT-P device. Echo 8/21: EF 50%. RV ok.  - he is volume overloaded based on Optivol/ Impedence. Wt up 5 lb.  - increase lasix to 40 mg daily x 2 days, then return to previous dosing, alternating 40 mg one day, then 20 mg the next (currently on 20 mg daily).  - Continue spironolactone 12.5 daily.  - Continue Coreg 3.125 mg bid.  - Continue lisinopril to 2.5 mg daily (reduced dose).  - Has CRT-P. No ICD given age.  - Check BMP today and again in 7 days  3. Hyperlipidemia:  On Zocor, good lipids 9/20.  4. Complete heart block: S/p MDT CRT-P device placement.  5. Atrial fibrillation: Chronic.   - continue apixaban 2.5 mg bid (reduced dose for age, creatinine > 1.5).   - denies any further abnormal bleeding. Recent GI w/u unrevealing.   F/u again  in 4-6 weeks.   Lyda Jester, PA-C  08/01/2020

## 2020-08-01 NOTE — Patient Instructions (Signed)
Take and extra 20 mg of Lasix today CHANGE Lasix to 40 mg daily alternating with 20 mg daily   Labs today We will only contact you if something comes back abnormal or we need to make some changes. Otherwise no news is good news!  Your physician recommends that you schedule a follow-up appointment in: 6 weeks  in the Advanced Practitioners (PA/NP) Clinic    If you have any questions or concerns before your next appointment please send [place] a message through [place] or call our office at [phone].    TO LEAVE A MESSAGE FOR THE NURSE SELECT OPTION 2, PLEASE LEAVE A MESSAGE INCLUDING: . YOUR NAME . DATE OF BIRTH . CALL BACK NUMBER . REASON FOR CALL**this is important as we prioritize the call backs  YOU WILL RECEIVE A CALL BACK THE SAME DAY AS LONG AS YOU CALL BEFORE 4:00 PM

## 2020-08-02 ENCOUNTER — Telehealth (HOSPITAL_COMMUNITY): Payer: Self-pay | Admitting: Cardiology

## 2020-08-02 DIAGNOSIS — I5022 Chronic systolic (congestive) heart failure: Secondary | ICD-10-CM

## 2020-08-02 NOTE — Telephone Encounter (Signed)
Pt aware and voiced understanding 

## 2020-08-02 NOTE — Telephone Encounter (Signed)
-----   Message from Consuelo Pandy, [place] sent at 08/01/2020  5:41 PM EDT ----- Bump in SCr. Stop lisinopril. Continue w/ plans to increase lasix for fluid overload as instructed in clinic today. Repeat BMP and check BNP in 1 week.

## 2020-08-10 ENCOUNTER — Other Ambulatory Visit (HOSPITAL_COMMUNITY): Payer: PPO

## 2020-08-25 ENCOUNTER — Other Ambulatory Visit: Payer: Self-pay | Admitting: Physician Assistant

## 2020-09-12 ENCOUNTER — Encounter (HOSPITAL_COMMUNITY): Payer: Self-pay

## 2020-09-12 ENCOUNTER — Other Ambulatory Visit: Payer: Self-pay

## 2020-09-12 ENCOUNTER — Ambulatory Visit (HOSPITAL_COMMUNITY)
Admission: RE | Admit: 2020-09-12 | Discharge: 2020-09-12 | Disposition: A | Discharge status: Home / Self Care | Payer: PPO | Source: Ambulatory Visit | Attending: Cardiology | Admitting: Cardiology

## 2020-09-12 VITALS — BP 107/62 | HR 75 | Wt 152.0 lb

## 2020-09-12 DIAGNOSIS — Z7901 Long term (current) use of anticoagulants: Secondary | ICD-10-CM | POA: Insufficient documentation

## 2020-09-12 DIAGNOSIS — Z8249 Family history of ischemic heart disease and other diseases of the circulatory system: Secondary | ICD-10-CM | POA: Insufficient documentation

## 2020-09-12 DIAGNOSIS — I482 Chronic atrial fibrillation, unspecified: Secondary | ICD-10-CM | POA: Insufficient documentation

## 2020-09-12 DIAGNOSIS — I5022 Chronic systolic (congestive) heart failure: Secondary | ICD-10-CM

## 2020-09-12 DIAGNOSIS — I2510 Atherosclerotic heart disease of native coronary artery without angina pectoris: Secondary | ICD-10-CM | POA: Diagnosis not present

## 2020-09-12 DIAGNOSIS — I252 Old myocardial infarction: Secondary | ICD-10-CM | POA: Insufficient documentation

## 2020-09-12 DIAGNOSIS — D649 Anemia, unspecified: Secondary | ICD-10-CM | POA: Insufficient documentation

## 2020-09-12 DIAGNOSIS — N189 Chronic kidney disease, unspecified: Secondary | ICD-10-CM | POA: Diagnosis not present

## 2020-09-12 DIAGNOSIS — I442 Atrioventricular block, complete: Secondary | ICD-10-CM | POA: Diagnosis not present

## 2020-09-12 DIAGNOSIS — E785 Hyperlipidemia, unspecified: Secondary | ICD-10-CM | POA: Insufficient documentation

## 2020-09-12 DIAGNOSIS — Z950 Presence of cardiac pacemaker: Secondary | ICD-10-CM | POA: Diagnosis not present

## 2020-09-12 DIAGNOSIS — Z79899 Other long term (current) drug therapy: Secondary | ICD-10-CM | POA: Insufficient documentation

## 2020-09-12 DIAGNOSIS — I255 Ischemic cardiomyopathy: Secondary | ICD-10-CM | POA: Diagnosis not present

## 2020-09-12 LAB — CBC
HCT: 36.1 % — ABNORMAL LOW (ref 39.0–52.0)
Hemoglobin: 11.5 g/dL — ABNORMAL LOW (ref 13.0–17.0)
MCH: 30.5 pg (ref 26.0–34.0)
MCHC: 31.9 g/dL (ref 30.0–36.0)
MCV: 95.8 fL (ref 80.0–100.0)
Platelets: 177 10*3/uL (ref 150–400)
RBC: 3.77 MIL/uL — ABNORMAL LOW (ref 4.22–5.81)
RDW: 13.2 % (ref 11.5–15.5)
WBC: 6.2 10*3/uL (ref 4.0–10.5)
nRBC: 0 % (ref 0.0–0.2)

## 2020-09-12 LAB — BASIC METABOLIC PANEL
Anion gap: 8 (ref 5–15)
BUN: 19 mg/dL (ref 8–23)
CO2: 29 mmol/L (ref 22–32)
Calcium: 8.9 mg/dL (ref 8.9–10.3)
Chloride: 102 mmol/L (ref 98–111)
Creatinine, Ser: 1.47 mg/dL — ABNORMAL HIGH (ref 0.61–1.24)
GFR, Estimated: 43 mL/min — ABNORMAL LOW (ref >60)
Glucose, Bld: 84 mg/dL (ref 70–99)
Potassium: 3.9 mmol/L (ref 3.5–5.1)
Sodium: 139 mmol/L (ref 135–145)

## 2020-09-12 NOTE — Patient Instructions (Signed)
It was great to see you today! No medication changes are needed at this time.  Labs today We will only contact you if something comes back abnormal or we need to make some changes. Otherwise no news is good news!  Your physician recommends that you schedule a follow-up appointment in: 2-3 months with Dr Aundra Dubin  If you have any questions or concerns before your next appointment please send [place] a message through [facility] or call our office at [phone].    TO LEAVE A MESSAGE FOR THE NURSE SELECT OPTION 2, PLEASE LEAVE A MESSAGE INCLUDING: . YOUR NAME . DATE OF BIRTH . CALL BACK NUMBER . REASON FOR CALL**this is important as we prioritize the call backs  YOU WILL RECEIVE A CALL BACK THE SAME DAY AS LONG AS YOU CALL BEFORE 4:00 PM

## 2020-09-12 NOTE — Progress Notes (Signed)
Patient ID: Mark Lane, male   DOB: 10-Mar-1922, 86 y.o.   MRN: [id number] PCP: Dr. Forde Dandy Cardiology: Dr. Aundra Dubin  86 y.o.with history of CAD and ischemic cardiomyopathy presents for followup of CAD and CHF.  He had an anterolateral STEMI in 7/11 with ostial occlusion of a large 2nd diagonal that was treated with BMS.  EF immediately post-MI was 35%.  He has also been noted to have Mobitz Type I 2nd degree AV block at times.  Last echo in 7/14 showed EF 30-35% with septal/apical akinesis and anterior hypokinesis.    In 2/20, he developed complete heart block and had implantation of a Medtronic CRT-P device.  He subsequently developed atrial fibrillation which has become chronic.   Echo was done in 9/20, showing EF 45-50%, mild LVH, normal RV.   He was recently seen by Dr. Aundra Dubin on 06/30/20 and had noted increased fatigue and weakness. Just prior to this, his wife was in the hospital for UTI. After this happened, he developed progressive weakness and fatigue and had had > 13 lb wt loss. Was tiring easily but no significant dyspnea. Device interrogation showed chronic rate controlled afib, w/ stable thoracic impedance and 98.5% BiV Pacing.  There was concern for possible GIB but w/u was negative. Hgb had not been significantly low.  He saw GI and stool was heme negative. Iron studies were WNL. When he saw Dr. Aundra Dubin, his BP was low in the 32D systolic. Meds were reduced. Lasix reduced to 20 mg daily (down from alternating dose of 40/20 daily) and Lisinopril reduced down to 5 mg daily. There was question regarding potential Takotsubo episode with wife's illness (markedly worse after she went into the hospital), thus repeat echo was recommended, however LVEF was low normal at 50%. RV ok.   3 days after his last clinic visit, on 8/29, he was admitted to the hospital for increased weakness, melanotic stools and failure to thrive. He also had rt forearm erythema concerning for cellulitis and this was treated w/  abx. Blood cultures were negative. UA also negative for UTI.   BP was soft in the 92E systolic. BMP showed slight increase in SCr to 1.88 (basline 1.4-1.7). He was hydrated w/ IVFs and HF meds held initially. Lisinopril ultimately restarted but at a lower dose of 2.5 mg daily. Lasix also restarted at 20 mg daily. Hgb remained stable ~11 however GI was consulted for w/u given melanotic stools.  07/05/20 EGD showed4 cm paraesophageal hiatal hernia with presence of erythematous nodularity in the hiatal hernia site as well as in the gastric body which was extensively biopsied. There was no presence of hematin or any evidence of recent bleeding; few gastric polyps. Pathology benign, showing patchy chronic gastritis. [place] red stain showed no evidence of amyloidosis.   He was seen on return f/u on 9/27 and noted he was feeling better. Less weak. Was able to ambulate into clinic w/o assistance (was in wheelchair the previous visit). BP was better 108/62. His wt however was up 5 lb from previous clinic visit and his Optivol/ impedence suggested fluid overload. Impedence had been down below reference curve since ~Aug 30. Fluid index progressively trended up and crossed threshold. He denies any significant increased dyspnea.  Interrogation also showed chronic afib w/ occasional RVR w/ rates in the 120s. Resting HR was in the 80s at time of visit. We increased his diuretics. He was instructed to increase lasix to 40 mg daily x 2 days, then return to previous dosing, alternating  40 mg one day, then 20 mg the next (he been only taking 20 mg once daily).    He presents back again for f/u. Here w/ son. Doing well. Very active for his age. Still drives. Does yardwork and grocery shops w/o much limitations. Cares for his wife. No significant exertional or resting dyspnea. Reports full med compliance. Denies abnormal bleeding. No falls. Device interrogation shows Optivol impedence/ fluid index c/w euvolemia. BP stable/ well  controlled. Denies orthostatic symptoms.     Medtronic device interrogation: atrial fibrillation chronically,  ~100 BiV pacing. Optivol/ Index c/w fluid euvolemia    ECG: not performed   Labs (6/13): LDL 68, HDL 40 Labs (2/14): K 3.9, creatinine 1.2 Labs (8/14): K 4.1, creatinine 1.4 Labs (12/14): creatinine 1.3 Labs (4/15): K 4.9, creatinine 1.92, BNP 494.6 Labs (9/15): K 3.9, creatinine 1.4, LDL 133, HDL 43 Labs (3/16): K 3.9, creatinine 1.22, LDL 76 Labs (10/16): K 3.8, creatinine 1.15 Labs (3/18): K 3.4, creatinine 1.22 Labs (7/18): LDL 43, HDL 43, LFTs normal Labs (10/18): K 4.1, creatinine 1.17, BNP 298 Labs (6/20): K 4.4, creatinine 1.27, hgb 11.7 Labs (9/20): K 4, creatinine 1.34, LDL 60 Labs (12/20): K 4.3, creatinine 1.64 Labs (8/21): hgb 12.8, K 3.7, creatinine 1.64, LFTs normal, TSH normal Labs (9/21): creatinine 1.99, K 4.4   PMH: 1. Hyperlipidemia 2. Nephrolithiasis 3. CAD: Anterolateral STEMI in 7/11.  LHC with ostial total occlusion of a large D2, dominant LCx, small RCA.  Patient had BMS to D2.  4. Complete heart block: Has Medtronic CRT-P device.  5. Ischemic cardiomyopathy: Echo (7/11) with EF 35% and peri-apical akinesis, grade II diastolic dysfunction, normal RV size and systolic function.  Echo (7/14) with EF 30-35%, septal/apical akinesis, anterior hypokinesis, mild MR/AI.  - Echo (9/20): EF 45-50%, mild LVH, normal RV.  6. CKD 7. Atrial fibrillation: Chronic.   SH: Married, lives in [place], prior tobacco.  Has a farm, grows peaches.   FH: CAD  ROS: All systems reviewed and negative except as per HPI  Current Outpatient Medications  Medication Sig Dispense Refill  . apixaban (ELIQUIS) 2.5 MG TABS tablet Take 1 tablet (2.5 mg total) by mouth 2 (two) times daily. 30 tablet 6  . carvedilol (COREG) 3.125 MG tablet TAKE  (1)  TABLET TWICE A DAY. 180 tablet 3  . cephALEXin (KEFLEX) 500 MG capsule Take 1 capsule (500 mg total) by mouth 2 (two) times daily.  8 capsule 0  . cholecalciferol (VITAMIN D) 1000 UNITS tablet Take 1,000 Units by mouth daily. 2 tablets    . furosemide (LASIX) 20 MG tablet TAKE 1 TABLET DAILY 90 tablet 1  . ondansetron (ZOFRAN ODT) 4 MG disintegrating tablet Take 1 tablet (4 mg total) by mouth every 8 (eight) hours as needed for nausea or vomiting. 20 tablet 0  . pantoprazole (PROTONIX) 40 MG tablet Take 1 tablet (40 mg total) by mouth daily. Take 30 min before breakfast 90 tablet 3  . simvastatin (ZOCOR) 40 MG tablet Take 1 tablet (40 mg total) by mouth at bedtime. 90 tablet 3  . spironolactone (ALDACTONE) 25 MG tablet TAKE (1/2) TABLET DAILY. 45 tablet 3  . vitamin B-12 (CYANOCOBALAMIN) 500 MCG tablet Take 1 tablet (500 mcg total) by mouth daily.     No current facility-administered medications for this encounter.   BP 107/62   Pulse 75   Wt 68.9 kg (152 lb)   SpO2 99%   BMI 25.29 kg/m   PHYSICAL EXAM: General:  Well  appearing, elderly male, ambulating w/o assistive device. No respiratory difficulty HEENT: normal Neck: supple. no JVD. Carotids 2+ bilat; no bruits. No lymphadenopathy or thyromegaly appreciated. Cor: PMI nondisplaced. Irregularly irregular rhythm. No rubs, gallops or murmurs. Lungs: clear Abdomen: soft, nontender, nondistended. No hepatosplenomegaly. No bruits or masses. Good bowel sounds. Extremities: no cyanosis, clubbing, rash, edema Neuro: alert & oriented x 3, cranial nerves grossly intact. moves all 4 extremities w/o difficulty. Affect pleasant.   Assessment/Plan: 1. CAD: Stable w/o CP. Continue ASA 81 mg,  blocker and statin.  2. Chronic systolic CHF: Ischemic cardiomyopathy. EF 45-50% (improved) on echo in 9/20. Now with Medtronic CRT-P device. Echo 8/21: EF 50%. RV ok.  - euvolemic on exam and based on Optivol fluid index/ Impedence. - Continue lasix w/ alternating dosing, 40 mg one day, then 20 mg the next. We discussed low sodium diet and daily wts - Continue spironolactone 12.5  daily.  - Continue Coreg 3.125 mg bid.  - no longer on ACEi due to CKD and low BP  - Has CRT-P, 100% pacing on device interrogation. No ICD given age.  - Check BMP today  3. Hyperlipidemia:  On Zocor, good lipids 9/20.  4. Complete heart block: S/p MDT CRT-P device placement.  5. Atrial fibrillation: Chronic.  Rate well controlled.  - continue apixaban 2.5 mg bid (reduced dose for age, creatinine > 1.5).   - has chronic anemia but recent GI w/u unrevealing. He denies any frank bleeding. Check CBC today   F/u in 6-8 weeks.   Lyda Jester, PA-C  09/12/2020

## 2020-09-12 NOTE — Progress Notes (Signed)
Medication Samples have been provided to the patient.  Drug name: Eliquis       Strength: 2.5mg         Qty: 5 boxes (14 tablets each)  LOT: [id number]  Exp.Date: 7/22  Dosing instructions: 1 tablet twice daily  The patient has been instructed regarding the correct time, dose, and frequency of taking this medication, including desired effects and most common side effects.   Mark Lane Travis Purk 11:11 AM 09/12/2020

## 2020-09-16 ENCOUNTER — Telehealth (HOSPITAL_COMMUNITY): Payer: Self-pay | Admitting: Cardiology

## 2020-09-16 NOTE — Telephone Encounter (Signed)
Medication concerns addressed regarding lisinopril not being on medication list/AVS

## 2020-09-16 NOTE — Telephone Encounter (Signed)
Tiffany(pt daughter) stated she left a  message on v/m couple days ago regarding a medication consult, please return call [phone], Thanks

## 2020-09-21 ENCOUNTER — Encounter (HOSPITAL_COMMUNITY): Payer: Self-pay

## 2020-09-28 ENCOUNTER — Ambulatory Visit (INDEPENDENT_AMBULATORY_CARE_PROVIDER_SITE_OTHER): Payer: PPO

## 2020-09-28 DIAGNOSIS — I442 Atrioventricular block, complete: Secondary | ICD-10-CM | POA: Diagnosis not present

## 2020-09-28 LAB — CUP PACEART REMOTE DEVICE CHECK
Battery Remaining Longevity: 72 mo
Battery Voltage: 2.96 V
Brady Statistic RA Percent Paced: 1.15 %
Brady Statistic RV Percent Paced: 97.4 %
Date Time Interrogation Session: 20211123201720
Implantable Lead Implant Date: 20200217
Implantable Lead Implant Date: 20200217
Implantable Lead Implant Date: 20200217
Implantable Lead Location: 753858
Implantable Lead Location: 753859
Implantable Lead Location: 753860
Implantable Lead Model: 4398
Implantable Lead Model: 5076
Implantable Lead Model: 5076
Implantable Pulse Generator Implant Date: 20200217
Lead Channel Impedance Value: 266 Ohm
Lead Channel Impedance Value: 285 Ohm
Lead Channel Impedance Value: 304 Ohm
Lead Channel Impedance Value: 342 Ohm
Lead Channel Impedance Value: 342 Ohm
Lead Channel Impedance Value: 342 Ohm
Lead Channel Impedance Value: 361 Ohm
Lead Channel Impedance Value: 437 Ohm
Lead Channel Impedance Value: 494 Ohm
Lead Channel Impedance Value: 513 Ohm
Lead Channel Impedance Value: 513 Ohm
Lead Channel Impedance Value: 532 Ohm
Lead Channel Impedance Value: 570 Ohm
Lead Channel Impedance Value: 608 Ohm
Lead Channel Pacing Threshold Amplitude: 0.5 V
Lead Channel Pacing Threshold Amplitude: 1.25 V
Lead Channel Pacing Threshold Amplitude: 1.375 V
Lead Channel Pacing Threshold Pulse Width: 0.4 ms
Lead Channel Pacing Threshold Pulse Width: 0.4 ms
Lead Channel Pacing Threshold Pulse Width: 1 ms
Lead Channel Sensing Intrinsic Amplitude: 0.375 mV
Lead Channel Sensing Intrinsic Amplitude: 0.375 mV
Lead Channel Sensing Intrinsic Amplitude: 14.5 mV
Lead Channel Sensing Intrinsic Amplitude: 14.5 mV
Lead Channel Setting Pacing Amplitude: 1.75 V
Lead Channel Setting Pacing Amplitude: 2.5 V
Lead Channel Setting Pacing Amplitude: 2.75 V
Lead Channel Setting Pacing Pulse Width: 0.4 ms
Lead Channel Setting Pacing Pulse Width: 1 ms
Lead Channel Setting Sensing Sensitivity: 1.2 mV

## 2020-10-06 NOTE — Progress Notes (Signed)
Remote pacemaker transmission.   

## 2020-11-24 DIAGNOSIS — H43813 Vitreous degeneration, bilateral: Secondary | ICD-10-CM | POA: Diagnosis not present

## 2020-11-24 DIAGNOSIS — H35373 Puckering of macula, bilateral: Secondary | ICD-10-CM | POA: Diagnosis not present

## 2020-11-24 DIAGNOSIS — H353131 Nonexudative age-related macular degeneration, bilateral, early dry stage: Secondary | ICD-10-CM | POA: Diagnosis not present

## 2020-11-24 DIAGNOSIS — H52203 Unspecified astigmatism, bilateral: Secondary | ICD-10-CM | POA: Diagnosis not present

## 2020-12-03 ENCOUNTER — Other Ambulatory Visit: Payer: Self-pay | Admitting: Internal Medicine

## 2020-12-06 ENCOUNTER — Other Ambulatory Visit (HOSPITAL_COMMUNITY): Payer: Self-pay

## 2020-12-06 DIAGNOSIS — I442 Atrioventricular block, complete: Secondary | ICD-10-CM

## 2020-12-06 MED ORDER — APIXABAN 2.5 MG PO TABS: 2.5000 mg | ORAL_TABLET | Freq: Two times a day (BID) | ORAL | 3 refills | Status: DC

## 2020-12-13 ENCOUNTER — Encounter (HOSPITAL_COMMUNITY): Payer: PPO | Admitting: Cardiology

## 2020-12-23 ENCOUNTER — Other Ambulatory Visit (HOSPITAL_COMMUNITY): Payer: Self-pay | Admitting: Cardiology

## 2020-12-28 ENCOUNTER — Ambulatory Visit (INDEPENDENT_AMBULATORY_CARE_PROVIDER_SITE_OTHER): Payer: PPO

## 2020-12-28 DIAGNOSIS — I442 Atrioventricular block, complete: Secondary | ICD-10-CM | POA: Diagnosis not present

## 2020-12-29 LAB — CUP PACEART REMOTE DEVICE CHECK
Battery Remaining Longevity: 64 mo
Battery Voltage: 2.95 V
Brady Statistic RA Percent Paced: 0.92 %
Brady Statistic RV Percent Paced: 97.97 %
Date Time Interrogation Session: 20220222185431
Implantable Lead Implant Date: 20200217
Implantable Lead Implant Date: 20200217
Implantable Lead Implant Date: 20200217
Implantable Lead Location: 753858
Implantable Lead Location: 753859
Implantable Lead Location: 753860
Implantable Lead Model: 4398
Implantable Lead Model: 5076
Implantable Lead Model: 5076
Implantable Pulse Generator Implant Date: 20200217
Lead Channel Impedance Value: 266 Ohm
Lead Channel Impedance Value: 304 Ohm
Lead Channel Impedance Value: 304 Ohm
Lead Channel Impedance Value: 361 Ohm
Lead Channel Impedance Value: 361 Ohm
Lead Channel Impedance Value: 380 Ohm
Lead Channel Impedance Value: 399 Ohm
Lead Channel Impedance Value: 437 Ohm
Lead Channel Impedance Value: 513 Ohm
Lead Channel Impedance Value: 513 Ohm
Lead Channel Impedance Value: 551 Ohm
Lead Channel Impedance Value: 570 Ohm
Lead Channel Impedance Value: 608 Ohm
Lead Channel Impedance Value: 665 Ohm
Lead Channel Pacing Threshold Amplitude: 0.5 V
Lead Channel Pacing Threshold Amplitude: 1.125 V
Lead Channel Pacing Threshold Amplitude: 1.5 V
Lead Channel Pacing Threshold Pulse Width: 0.4 ms
Lead Channel Pacing Threshold Pulse Width: 0.4 ms
Lead Channel Pacing Threshold Pulse Width: 1 ms
Lead Channel Sensing Intrinsic Amplitude: 0.5 mV
Lead Channel Sensing Intrinsic Amplitude: 0.5 mV
Lead Channel Sensing Intrinsic Amplitude: 16 mV
Lead Channel Sensing Intrinsic Amplitude: 16 mV
Lead Channel Setting Pacing Amplitude: 1.75 V
Lead Channel Setting Pacing Amplitude: 2.5 V
Lead Channel Setting Pacing Amplitude: 3 V
Lead Channel Setting Pacing Pulse Width: 0.4 ms
Lead Channel Setting Pacing Pulse Width: 1 ms
Lead Channel Setting Sensing Sensitivity: 1.2 mV

## 2021-01-05 DIAGNOSIS — D2372 Other benign neoplasm of skin of left lower limb, including hip: Secondary | ICD-10-CM | POA: Diagnosis not present

## 2021-01-05 DIAGNOSIS — D2371 Other benign neoplasm of skin of right lower limb, including hip: Secondary | ICD-10-CM | POA: Diagnosis not present

## 2021-01-05 DIAGNOSIS — M79673 Pain in unspecified foot: Secondary | ICD-10-CM | POA: Diagnosis not present

## 2021-01-06 NOTE — Progress Notes (Signed)
Remote pacemaker transmission.   

## 2021-02-10 ENCOUNTER — Other Ambulatory Visit: Payer: Self-pay

## 2021-02-10 ENCOUNTER — Encounter (HOSPITAL_COMMUNITY): Payer: Self-pay | Admitting: Cardiology

## 2021-02-10 ENCOUNTER — Ambulatory Visit (HOSPITAL_COMMUNITY)
Admission: RE | Admit: 2021-02-10 | Discharge: 2021-02-10 | Disposition: A | Discharge status: Home / Self Care | Payer: PPO | Source: Ambulatory Visit | Attending: Cardiology | Admitting: Cardiology

## 2021-02-10 VITALS — BP 98/60 | HR 62 | Wt 155.6 lb

## 2021-02-10 DIAGNOSIS — I482 Chronic atrial fibrillation, unspecified: Secondary | ICD-10-CM | POA: Diagnosis not present

## 2021-02-10 DIAGNOSIS — Z79899 Other long term (current) drug therapy: Secondary | ICD-10-CM | POA: Insufficient documentation

## 2021-02-10 DIAGNOSIS — I5022 Chronic systolic (congestive) heart failure: Secondary | ICD-10-CM | POA: Insufficient documentation

## 2021-02-10 DIAGNOSIS — I442 Atrioventricular block, complete: Secondary | ICD-10-CM | POA: Diagnosis not present

## 2021-02-10 DIAGNOSIS — I2510 Atherosclerotic heart disease of native coronary artery without angina pectoris: Secondary | ICD-10-CM | POA: Insufficient documentation

## 2021-02-10 DIAGNOSIS — Z7901 Long term (current) use of anticoagulants: Secondary | ICD-10-CM | POA: Insufficient documentation

## 2021-02-10 DIAGNOSIS — I255 Ischemic cardiomyopathy: Secondary | ICD-10-CM | POA: Diagnosis not present

## 2021-02-10 DIAGNOSIS — Z87891 Personal history of nicotine dependence: Secondary | ICD-10-CM | POA: Diagnosis not present

## 2021-02-10 DIAGNOSIS — E785 Hyperlipidemia, unspecified: Secondary | ICD-10-CM | POA: Diagnosis not present

## 2021-02-10 DIAGNOSIS — Z8249 Family history of ischemic heart disease and other diseases of the circulatory system: Secondary | ICD-10-CM | POA: Insufficient documentation

## 2021-02-10 DIAGNOSIS — R0789 Other chest pain: Secondary | ICD-10-CM | POA: Insufficient documentation

## 2021-02-10 HISTORY — DX: Heart failure, unspecified: I50.9

## 2021-02-10 LAB — LIPID PANEL
Cholesterol: 139 mg/dL (ref 0–200)
HDL: 51 mg/dL (ref >40)
LDL Cholesterol: 74 mg/dL (ref 0–99)
Total CHOL/HDL Ratio: 2.7 RATIO
Triglycerides: 70 mg/dL (ref <150)
VLDL: 14 mg/dL (ref 0–40)

## 2021-02-10 LAB — BASIC METABOLIC PANEL
Anion gap: 7 (ref 5–15)
BUN: 15 mg/dL (ref 8–23)
CO2: 31 mmol/L (ref 22–32)
Calcium: 9.4 mg/dL (ref 8.9–10.3)
Chloride: 101 mmol/L (ref 98–111)
Creatinine, Ser: 1.52 mg/dL — ABNORMAL HIGH (ref 0.61–1.24)
GFR, Estimated: 41 mL/min — ABNORMAL LOW (ref >60)
Glucose, Bld: 87 mg/dL (ref 70–99)
Potassium: 4.5 mmol/L (ref 3.5–5.1)
Sodium: 139 mmol/L (ref 135–145)

## 2021-02-10 LAB — CBC
HCT: 39.2 % (ref 39.0–52.0)
Hemoglobin: 12.6 g/dL — ABNORMAL LOW (ref 13.0–17.0)
MCH: 30.7 pg (ref 26.0–34.0)
MCHC: 32.1 g/dL (ref 30.0–36.0)
MCV: 95.4 fL (ref 80.0–100.0)
Platelets: 192 10*3/uL (ref 150–400)
RBC: 4.11 MIL/uL — ABNORMAL LOW (ref 4.22–5.81)
RDW: 14.3 % (ref 11.5–15.5)
WBC: 6.4 10*3/uL (ref 4.0–10.5)
nRBC: 0 % (ref 0.0–0.2)

## 2021-02-10 NOTE — Progress Notes (Signed)
Medication Samples have been provided to the patient.  Drug name: Eliquis       Strength: 2.5 mg        Qty: 2  LOT: [id number]  Exp.Date: 11/2021  Dosing instructions: Take 1 tablet Twice daily   The patient has been instructed regarding the correct time, dose, and frequency of taking this medication, including desired effects and most common side effects.   Mark Lane 3:50 PM 02/10/2021

## 2021-02-10 NOTE — Patient Instructions (Addendum)
EKG done today.  Labs done today. We will contact you only if your labs are abnormal.  No medication changes were made. Please continue all current medications as prescribed.  Your physician recommends that you schedule a follow-up appointment in: 4 months  If you have any questions or concerns before your next appointment please send [place] a message through [place] or call our office at [phone].    TO LEAVE A MESSAGE FOR THE NURSE SELECT OPTION 2, PLEASE LEAVE A MESSAGE INCLUDING: . YOUR NAME . DATE OF BIRTH . CALL BACK NUMBER . REASON FOR CALL**this is important as we prioritize the call backs  YOU WILL RECEIVE A CALL BACK THE SAME DAY AS LONG AS YOU CALL BEFORE 4:00 PM   Do the following things EVERYDAY: 1) Weigh yourself in the morning before breakfast. Write it down and keep it in a log. 2) Take your medicines as prescribed 3) Eat low salt foods--Limit salt (sodium) to 2000 mg per day.  4) Stay as active as you can everyday 5) Limit all fluids for the day to less than 2 liters   At the [facility], you and your health needs are our priority. As part of our continuing mission to provide you with exceptional heart care, we have created designated Provider Care Teams. These Care Teams include your primary Cardiologist (physician) and Advanced Practice Providers (APPs- Physician Assistants and Nurse Practitioners) who all work together to provide you with the care you need, when you need it.   You may see any of the following providers on your designated Care Team at your next follow up: Marland Kitchen Dr Glori Bickers . Dr [name] . Darrick Grinder, NP . Lyda Jester, PA . Audry Riles, PharmD   Please be sure to bring in all your medications bottles to every appointment.

## 2021-02-12 NOTE — Progress Notes (Signed)
Patient ID: Mark Lane, male   DOB: [birth year], 87 y.o.   MRN: [id number] PCP: Dr. Forde Dandy Cardiology: Dr. Aundra Dubin  87 y.o.with history of CAD and ischemic cardiomyopathy presents for followup of CAD and CHF.  He had an anterolateral STEMI in 7/11 with ostial occlusion of a large 2nd diagonal that was treated with BMS.  EF immediately post-MI was 35%.  He has also been noted to have Mobitz Type I 2nd degree AV block at times.  Last echo in 7/14 showed EF 30-35% with septal/apical akinesis and anterior hypokinesis.    In 2/20, he developed complete heart block and had implantation of a Medtronic CRT-P device.  He subsequently developed atrial fibrillation which has become chronic.   Echo was done in 9/20, showing EF 45-50%, mild LVH, normal RV.   Echo in 8/21 showed EF 50%, normal RV.   He is now off ACEI due to soft BP.  No dyspnea with his usual activities.  No lightheadedness.  Chest will feel "funny" at times, symptoms are not exertional.  Weight is stable. He is in chronic atrial fibrillation.   Medtronic device interrogation: atrial fibrillation chronically, stable thoracic impedance, 99% BiV pacing.   ECG (personally reviewed): Atrial fibrillation with BiV pacing   Labs (6/13): LDL 68, HDL 40 Labs (2/14): K 3.9, creatinine 1.2 Labs (8/14): K 4.1, creatinine 1.4 Labs (12/14): creatinine 1.3 Labs (4/15): K 4.9, creatinine 1.92, BNP 494.6 Labs (9/15): K 3.9, creatinine 1.4, LDL 133, HDL 43 Labs (3/16): K 3.9, creatinine 1.22, LDL 76 Labs (10/16): K 3.8, creatinine 1.15 Labs (3/18): K 3.4, creatinine 1.22 Labs (7/18): LDL 43, HDL 43, LFTs normal Labs (10/18): K 4.1, creatinine 1.17, BNP 298 Labs (6/20): K 4.4, creatinine 1.27, hgb 11.7 Labs (9/20): K 4, creatinine 1.34, LDL 60 Labs (12/20): K 4.3, creatinine 1.64 Labs (8/21): hgb 12.8, K 3.7, creatinine 1.64, LFTs normal, TSH normal Labs (11/21): K 3.9, creatinine 1.47  PMH: 1. Hyperlipidemia 2. Nephrolithiasis 3. CAD:  Anterolateral STEMI in 7/11.  LHC with ostial total occlusion of a large D2, dominant LCx, small RCA.  Patient had BMS to D2.  4. Complete heart block: Has Medtronic CRT-P device.  5. Ischemic cardiomyopathy: Echo (7/11) with EF 35% and peri-apical akinesis, grade II diastolic dysfunction, normal RV size and systolic function.  Echo (7/14) with EF 30-35%, septal/apical akinesis, anterior hypokinesis, mild MR/AI.  - Echo (9/20): EF 45-50%, mild LVH, normal RV.  - Echo (8/21): EF 50%, normal RV 6. CKD 7. Atrial fibrillation: Chronic.  8. Chronic gastritis  SH: Married, lives in [place], prior tobacco smoker.  Has a farm, grows peaches.   FH: CAD  ROS: All systems reviewed and negative except as per HPI  Current Outpatient Medications  Medication Sig Dispense Refill  . apixaban (ELIQUIS) 2.5 MG TABS tablet Take 1 tablet (2.5 mg total) by mouth 2 (two) times daily. 180 tablet 3  . carvedilol (COREG) 3.125 MG tablet TAKE  (1)  TABLET TWICE A DAY. 180 tablet 3  . cholecalciferol (VITAMIN D) 1000 UNITS tablet Take 1,000 Units by mouth daily. 2 tablets    . furosemide (LASIX) 20 MG tablet TAKE 1 TABLET DAILY Needs doctor appointment for further refill. 60 tablet 0  . ondansetron (ZOFRAN ODT) 4 MG disintegrating tablet Take 1 tablet (4 mg total) by mouth every 8 (eight) hours as needed for nausea or vomiting. 20 tablet 0  . pantoprazole (PROTONIX) 40 MG tablet Take 1 tablet (40 mg total) by mouth daily. Take  30 min before breakfast 90 tablet 3  . simvastatin (ZOCOR) 40 MG tablet Take 1 tablet (40 mg total) by mouth at bedtime. 90 tablet 3  . simvastatin (ZOCOR) 40 MG tablet Take 80 mg by mouth daily.    Marland Kitchen spironolactone (ALDACTONE) 25 MG tablet TAKE (1/2) TABLET DAILY. 45 tablet 3  . vitamin B-12 (CYANOCOBALAMIN) 500 MCG tablet Take 1 tablet (500 mcg total) by mouth daily.     No current facility-administered medications for this encounter.   BP 98/60   Pulse 62   Wt 70.6 kg (155 lb 9.6 oz)    SpO2 97%   BMI 25.89 kg/m  General: NAD Neck: JVP 8 cm, no thyromegaly or thyroid nodule.  Lungs: Clear to auscultation bilaterally with normal respiratory effort. CV: Nondisplaced PMI.  Heart irregular S1/S2, no S3/S4, no murmur.  1+ ankle edema.  No carotid bruit.  Normal pedal pulses.  Abdomen: Soft, nontender, no hepatosplenomegaly, no distention.  Skin: Intact without lesions or rashes.  Neurologic: Alert and oriented x 3.  Psych: Normal affect. Extremities: No clubbing or cyanosis.  HEENT: Normal.   Assessment/Plan: 1. CAD: Atypical chest pain.  Continue ASA 81 and statin.  2. Chronic systolic CHF: Ischemic cardiomyopathy.  Now with Medtronic CRT-P device.  Echo in 8/21 with EF up to 50% with normal RV.  Very mild volume overload on exam though Optivol looks ok. NYHA class II symptoms.   - Continue Lasix 20 mg daily.  - Continue spironolactone 12.5 daily.  - Continue Coreg 3.125 mg bid.  - Off lisinopril with soft BP.   - No ICD given age.  3. Hyperlipidemia:  On Zocor, check lipids today.  4. Complete heart block: S/p MDT CRT-P device placement.  5. Atrial fibrillation: Chronic.   - Continue apixaban 2.5 mg bid (age, creatinine > 1.5).    Followup in 4 months.   [name] 02/12/2021

## 2021-03-24 DIAGNOSIS — I4891 Unspecified atrial fibrillation: Secondary | ICD-10-CM | POA: Diagnosis not present

## 2021-03-24 DIAGNOSIS — R059 Cough, unspecified: Secondary | ICD-10-CM | POA: Diagnosis not present

## 2021-03-24 DIAGNOSIS — Z20828 Contact with and (suspected) exposure to other viral communicable diseases: Secondary | ICD-10-CM | POA: Diagnosis not present

## 2021-03-24 DIAGNOSIS — I129 Hypertensive chronic kidney disease with stage 1 through stage 4 chronic kidney disease, or unspecified chronic kidney disease: Secondary | ICD-10-CM | POA: Diagnosis not present

## 2021-03-24 DIAGNOSIS — R6883 Chills (without fever): Secondary | ICD-10-CM | POA: Diagnosis not present

## 2021-03-24 DIAGNOSIS — N1831 Chronic kidney disease, stage 3a: Secondary | ICD-10-CM | POA: Diagnosis not present

## 2021-03-24 DIAGNOSIS — Z1152 Encounter for screening for COVID-19: Secondary | ICD-10-CM | POA: Diagnosis not present

## 2021-03-29 ENCOUNTER — Ambulatory Visit
Admission: RE | Admit: 2021-03-29 | Discharge: 2021-03-29 | Disposition: A | Discharge status: Home / Self Care | Payer: PPO | Source: Ambulatory Visit | Attending: Endocrinology | Admitting: Endocrinology

## 2021-03-29 ENCOUNTER — Other Ambulatory Visit (HOSPITAL_COMMUNITY): Payer: Self-pay | Admitting: Cardiology

## 2021-03-29 ENCOUNTER — Other Ambulatory Visit: Payer: Self-pay | Admitting: Endocrinology

## 2021-03-29 ENCOUNTER — Ambulatory Visit (INDEPENDENT_AMBULATORY_CARE_PROVIDER_SITE_OTHER): Payer: PPO

## 2021-03-29 ENCOUNTER — Other Ambulatory Visit: Payer: Self-pay

## 2021-03-29 DIAGNOSIS — K449 Diaphragmatic hernia without obstruction or gangrene: Secondary | ICD-10-CM | POA: Diagnosis not present

## 2021-03-29 DIAGNOSIS — K5730 Diverticulosis of large intestine without perforation or abscess without bleeding: Secondary | ICD-10-CM | POA: Diagnosis not present

## 2021-03-29 DIAGNOSIS — R109 Unspecified abdominal pain: Secondary | ICD-10-CM | POA: Diagnosis not present

## 2021-03-29 DIAGNOSIS — I255 Ischemic cardiomyopathy: Secondary | ICD-10-CM | POA: Diagnosis not present

## 2021-03-29 DIAGNOSIS — R1012 Left upper quadrant pain: Secondary | ICD-10-CM | POA: Diagnosis not present

## 2021-03-29 DIAGNOSIS — R1084 Generalized abdominal pain: Secondary | ICD-10-CM

## 2021-03-30 DIAGNOSIS — E785 Hyperlipidemia, unspecified: Secondary | ICD-10-CM | POA: Diagnosis not present

## 2021-03-30 DIAGNOSIS — R5383 Other fatigue: Secondary | ICD-10-CM | POA: Diagnosis not present

## 2021-03-30 DIAGNOSIS — I129 Hypertensive chronic kidney disease with stage 1 through stage 4 chronic kidney disease, or unspecified chronic kidney disease: Secondary | ICD-10-CM | POA: Diagnosis not present

## 2021-03-30 DIAGNOSIS — N1831 Chronic kidney disease, stage 3a: Secondary | ICD-10-CM | POA: Diagnosis not present

## 2021-03-30 LAB — CUP PACEART REMOTE DEVICE CHECK
Battery Remaining Longevity: 57 mo
Battery Voltage: 2.95 V
Brady Statistic RA Percent Paced: 1.02 %
Brady Statistic RV Percent Paced: 97.6 %
Date Time Interrogation Session: 20220524213942
Implantable Lead Implant Date: 20200217
Implantable Lead Implant Date: 20200217
Implantable Lead Implant Date: 20200217
Implantable Lead Location: 753858
Implantable Lead Location: 753859
Implantable Lead Location: 753860
Implantable Lead Model: 4398
Implantable Lead Model: 5076
Implantable Lead Model: 5076
Implantable Pulse Generator Implant Date: 20200217
Lead Channel Impedance Value: 266 Ohm
Lead Channel Impedance Value: 285 Ohm
Lead Channel Impedance Value: 304 Ohm
Lead Channel Impedance Value: 361 Ohm
Lead Channel Impedance Value: 361 Ohm
Lead Channel Impedance Value: 380 Ohm
Lead Channel Impedance Value: 399 Ohm
Lead Channel Impedance Value: 437 Ohm
Lead Channel Impedance Value: 494 Ohm
Lead Channel Impedance Value: 513 Ohm
Lead Channel Impedance Value: 532 Ohm
Lead Channel Impedance Value: 551 Ohm
Lead Channel Impedance Value: 608 Ohm
Lead Channel Impedance Value: 646 Ohm
Lead Channel Pacing Threshold Amplitude: 0.5 V
Lead Channel Pacing Threshold Amplitude: 1.125 V
Lead Channel Pacing Threshold Amplitude: 1.375 V
Lead Channel Pacing Threshold Pulse Width: 0.4 ms
Lead Channel Pacing Threshold Pulse Width: 0.4 ms
Lead Channel Pacing Threshold Pulse Width: 1 ms
Lead Channel Sensing Intrinsic Amplitude: 0.375 mV
Lead Channel Sensing Intrinsic Amplitude: 0.375 mV
Lead Channel Sensing Intrinsic Amplitude: 10.25 mV
Lead Channel Sensing Intrinsic Amplitude: 10.25 mV
Lead Channel Setting Pacing Amplitude: 2 V
Lead Channel Setting Pacing Amplitude: 2.5 V
Lead Channel Setting Pacing Amplitude: 3 V
Lead Channel Setting Pacing Pulse Width: 0.4 ms
Lead Channel Setting Pacing Pulse Width: 1 ms
Lead Channel Setting Sensing Sensitivity: 1.2 mV

## 2021-03-31 DIAGNOSIS — R531 Weakness: Secondary | ICD-10-CM | POA: Diagnosis not present

## 2021-03-31 DIAGNOSIS — R1084 Generalized abdominal pain: Secondary | ICD-10-CM | POA: Diagnosis not present

## 2021-04-21 NOTE — Progress Notes (Signed)
Remote pacemaker transmission.   

## 2021-06-14 ENCOUNTER — Other Ambulatory Visit (HOSPITAL_COMMUNITY): Payer: Self-pay | Admitting: Cardiology

## 2021-06-20 ENCOUNTER — Other Ambulatory Visit: Payer: Self-pay

## 2021-06-20 ENCOUNTER — Ambulatory Visit (HOSPITAL_COMMUNITY)
Admission: RE | Admit: 2021-06-20 | Discharge: 2021-06-20 | Disposition: A | Discharge status: Home / Self Care | Payer: PPO | Source: Ambulatory Visit | Attending: Cardiology | Admitting: Cardiology

## 2021-06-20 ENCOUNTER — Encounter (HOSPITAL_COMMUNITY): Payer: Self-pay | Admitting: Cardiology

## 2021-06-20 VITALS — BP 102/58 | HR 74 | Wt 150.0 lb

## 2021-06-20 DIAGNOSIS — I5022 Chronic systolic (congestive) heart failure: Secondary | ICD-10-CM

## 2021-06-20 DIAGNOSIS — I482 Chronic atrial fibrillation, unspecified: Secondary | ICD-10-CM | POA: Diagnosis not present

## 2021-06-20 DIAGNOSIS — I255 Ischemic cardiomyopathy: Secondary | ICD-10-CM | POA: Diagnosis not present

## 2021-06-20 DIAGNOSIS — E785 Hyperlipidemia, unspecified: Secondary | ICD-10-CM | POA: Diagnosis not present

## 2021-06-20 DIAGNOSIS — I252 Old myocardial infarction: Secondary | ICD-10-CM | POA: Insufficient documentation

## 2021-06-20 DIAGNOSIS — Z87891 Personal history of nicotine dependence: Secondary | ICD-10-CM | POA: Insufficient documentation

## 2021-06-20 DIAGNOSIS — Z79899 Other long term (current) drug therapy: Secondary | ICD-10-CM | POA: Diagnosis not present

## 2021-06-20 DIAGNOSIS — I4819 Other persistent atrial fibrillation: Secondary | ICD-10-CM

## 2021-06-20 DIAGNOSIS — R0789 Other chest pain: Secondary | ICD-10-CM | POA: Insufficient documentation

## 2021-06-20 DIAGNOSIS — Z7901 Long term (current) use of anticoagulants: Secondary | ICD-10-CM | POA: Diagnosis not present

## 2021-06-20 DIAGNOSIS — I2510 Atherosclerotic heart disease of native coronary artery without angina pectoris: Secondary | ICD-10-CM | POA: Insufficient documentation

## 2021-06-20 DIAGNOSIS — N189 Chronic kidney disease, unspecified: Secondary | ICD-10-CM | POA: Diagnosis not present

## 2021-06-20 DIAGNOSIS — R531 Weakness: Secondary | ICD-10-CM | POA: Diagnosis not present

## 2021-06-20 LAB — BASIC METABOLIC PANEL
Anion gap: 8 (ref 5–15)
BUN: 19 mg/dL (ref 8–23)
CO2: 27 mmol/L (ref 22–32)
Calcium: 9.5 mg/dL (ref 8.9–10.3)
Chloride: 103 mmol/L (ref 98–111)
Creatinine, Ser: 1.54 mg/dL — ABNORMAL HIGH (ref 0.61–1.24)
GFR, Estimated: 41 mL/min — ABNORMAL LOW (ref >60)
Glucose, Bld: 108 mg/dL — ABNORMAL HIGH (ref 70–99)
Potassium: 4.3 mmol/L (ref 3.5–5.1)
Sodium: 138 mmol/L (ref 135–145)

## 2021-06-20 LAB — CBC
HCT: 39.4 % (ref 39.0–52.0)
Hemoglobin: 12.6 g/dL — ABNORMAL LOW (ref 13.0–17.0)
MCH: 30.6 pg (ref 26.0–34.0)
MCHC: 32 g/dL (ref 30.0–36.0)
MCV: 95.6 fL (ref 80.0–100.0)
Platelets: 186 10*3/uL (ref 150–400)
RBC: 4.12 MIL/uL — ABNORMAL LOW (ref 4.22–5.81)
RDW: 14 % (ref 11.5–15.5)
WBC: 6.6 10*3/uL (ref 4.0–10.5)
nRBC: 0 % (ref 0.0–0.2)

## 2021-06-20 LAB — BRAIN NATRIURETIC PEPTIDE: B Natriuretic Peptide: 354.4 pg/mL — ABNORMAL HIGH (ref 0.0–100.0)

## 2021-06-20 NOTE — Progress Notes (Signed)
Patient ID: Mark Lane, male   DOB: [birth year], 87 y.o.   MRN: [id number] PCP: Dr. Forde Dandy Cardiology: Dr. Aundra Dubin  87 y.o.with history of CAD and ischemic cardiomyopathy presents for followup of CAD and CHF.  He had an anterolateral STEMI in 7/11 with ostial occlusion of a large 2nd diagonal that was treated with BMS.  EF immediately post-MI was 35%.  He has also been noted to have Mobitz Type I 2nd degree AV block at times.  Last echo in 7/14 showed EF 30-35% with septal/apical akinesis and anterior hypokinesis.    In 2/20, he developed complete heart block and had implantation of a Medtronic CRT-P device.  He subsequently developed atrial fibrillation which has become chronic.   Echo was done in 9/20, showing EF 45-50%, mild LVH, normal RV.   Echo in 8/21 showed EF 50%, normal RV.   He is now off ACEI due to soft BP.  Not as active as he used to be.  No lightheadedness or falls.  Appetite is good.  Fatigues easily and legs feel weak, but able to walk to mailbox without fatigue/dyspnea. He has tingling in his feet especially when he sits a long time.    Medtronic device interrogation: atrial fibrillation chronically, stable thoracic impedance with fluid index < threshold, 99% BiV pacing.   Labs (6/13): LDL 68, HDL 40 Labs (2/14): K 3.9, creatinine 1.2 Labs (8/14): K 4.1, creatinine 1.4 Labs (12/14): creatinine 1.3 Labs (4/15): K 4.9, creatinine 1.92, BNP 494.6 Labs (9/15): K 3.9, creatinine 1.4, LDL 133, HDL 43 Labs (3/16): K 3.9, creatinine 1.22, LDL 76 Labs (10/16): K 3.8, creatinine 1.15 Labs (3/18): K 3.4, creatinine 1.22 Labs (7/18): LDL 43, HDL 43, LFTs normal Labs (10/18): K 4.1, creatinine 1.17, BNP 298 Labs (6/20): K 4.4, creatinine 1.27, hgb 11.7 Labs (9/20): K 4, creatinine 1.34, LDL 60 Labs (12/20): K 4.3, creatinine 1.64 Labs (8/21): hgb 12.8, K 3.7, creatinine 1.64, LFTs normal, TSH normal Labs (11/21): K 3.9, creatinine 1.47 Labs (4/22): LDL 74, HDL 51, K 4.5, creatinine  1.52  PMH: 1. Hyperlipidemia 2. Nephrolithiasis 3. CAD: Anterolateral STEMI in 7/11.  LHC with ostial total occlusion of a large D2, dominant LCx, small RCA.  Patient had BMS to D2.  4. Complete heart block: Has Medtronic CRT-P device.  5. Ischemic cardiomyopathy: Echo (7/11) with EF 35% and peri-apical akinesis, grade II diastolic dysfunction, normal RV size and systolic function.  Echo (7/14) with EF 30-35%, septal/apical akinesis, anterior hypokinesis, mild MR/AI.  - Echo (9/20): EF 45-50%, mild LVH, normal RV.  - Echo (8/21): EF 50%, normal RV 6. CKD 7. Atrial fibrillation: Chronic.  8. Chronic gastritis  SH: Married, lives in [place], prior tobacco smoker.  Has a farm, grows peaches.   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 180 tablet 3   carvedilol (COREG) 3.125 MG tablet TAKE  (1)  TABLET TWICE A DAY. 180 tablet 3   cholecalciferol (VITAMIN D) 1000 UNITS tablet Take 1,000 Units by mouth daily. 2 tablets     furosemide (LASIX) 20 MG tablet TAKE 1 TABLET DAILY 120 tablet 0   ondansetron (ZOFRAN ODT) 4 MG disintegrating tablet Take 1 tablet (4 mg total) by mouth every 8 (eight) hours as needed for nausea or vomiting. 20 tablet 0  pantoprazole (PROTONIX) 40 MG tablet Take 1 tablet (40 mg total) by mouth daily. Take 30 min before breakfast 90 tablet 3   simvastatin (ZOCOR) 20 MG tablet Take 2 tablets (40 mg total) by mouth at bedtime. 90 tablet 1   simvastatin (ZOCOR) 40 MG tablet Take 1 tablet (40 mg total) by mouth at bedtime. 90 tablet 3   simvastatin (ZOCOR) 40 MG tablet Take 80 mg by mouth daily.     spironolactone (ALDACTONE) 25 MG tablet TAKE (1/2) TABLET DAILY. 45 tablet 3   vitamin B-12 (CYANOCOBALAMIN) 500 MCG tablet Take 1 tablet (500 mcg total) by mouth daily.     No current  facility-administered medications for this encounter.   BP (!) 102/58   Pulse 74   Wt 68 kg (150 lb)   SpO2 98%   BMI 24.96 kg/m  General: NAD Neck: No JVD, no thyromegaly or thyroid nodule.  Lungs: Clear to auscultation bilaterally with normal respiratory effort. CV: Nondisplaced PMI.  Heart regular S1/S2, no S3/S4, no murmur.  1+ ankle edema.  No carotid bruit.  Normal pedal pulses.  Abdomen: Soft, nontender, no hepatosplenomegaly, no distention.  Skin: Intact without lesions or rashes.  Neurologic: Alert and oriented x 3.  Psych: Normal affect. Extremities: No clubbing or cyanosis.  HEENT: Normal.   Assessment/Plan: 1. CAD: Atypical chest pain.  Continue ASA 81 and statin.  2. Chronic systolic CHF: Ischemic cardiomyopathy.  Now with Medtronic CRT-P device.  Echo in 8/21 with EF up to 50% with normal RV.  Not volume overloaded by exam or Optivol. NYHA class II symptoms.   - Continue Lasix 20 mg daily, BMET/BNP today.  - Continue spironolactone 12.5 daily.  - Continue Coreg 3.125 mg bid.  - Off lisinopril with soft BP.   3. Hyperlipidemia:  Good lipids in 4/22.  4. Complete heart block: S/p MDT CRT-P device placement.  5. Atrial fibrillation: Chronic.   - Continue apixaban 2.5 mg bid (age, creatinine > 1.5).    Followup in 4 months.   [name] 06/20/2021

## 2021-06-20 NOTE — Patient Instructions (Signed)
Labs done today. We will contact you only if your labs are abnormal.  No medication changes were made. Please continue all current medications as prescribed.  Your physician recommends that you schedule a follow-up appointment in: 4 months  If you have any questions or concerns before your next appointment please send us a message through mychart or call our office at [phone].    TO LEAVE A MESSAGE FOR THE NURSE SELECT OPTION 2, PLEASE LEAVE A MESSAGE INCLUDING: YOUR NAME DATE OF BIRTH CALL BACK NUMBER REASON FOR CALL**this is important as we prioritize the call backs  YOU WILL RECEIVE A CALL BACK THE SAME DAY AS LONG AS YOU CALL BEFORE 4:00 PM   Do the following things EVERYDAY: Weigh yourself in the morning before breakfast. Write it down and keep it in a log. Take your medicines as prescribed Eat low salt foods--Limit salt (sodium) to 2000 mg per day.  Stay as active as you can everyday Limit all fluids for the day to less than 2 liters   At the [facility], you and your health needs are our priority. As part of our continuing mission to provide you with exceptional heart care, we have created designated Provider Care Teams. These Care Teams include your primary Cardiologist (physician) and Advanced Practice Providers (APPs- Physician Assistants and Nurse Practitioners) who all work together to provide you with the care you need, when you need it.   You may see any of the following providers on your designated Care Team at your next follow up: Dr Daniel Bensimhon Dr Dalton McLean Amy Clegg, NP Brittainy Simmons, PA Lauren Kemp, PharmD   Please be sure to bring in all your medications bottles to every appointment.   

## 2021-06-28 ENCOUNTER — Ambulatory Visit (INDEPENDENT_AMBULATORY_CARE_PROVIDER_SITE_OTHER): Payer: PPO

## 2021-06-28 DIAGNOSIS — I440 Atrioventricular block, first degree: Secondary | ICD-10-CM | POA: Diagnosis not present

## 2021-06-28 LAB — CUP PACEART REMOTE DEVICE CHECK
Battery Remaining Longevity: 46 mo
Battery Voltage: 2.94 V
Brady Statistic RA Percent Paced: 1.17 %
Brady Statistic RV Percent Paced: 96.22 %
Date Time Interrogation Session: 20220823223832
Implantable Lead Implant Date: 20200217
Implantable Lead Implant Date: 20200217
Implantable Lead Implant Date: 20200217
Implantable Lead Location: 753858
Implantable Lead Location: 753859
Implantable Lead Location: 753860
Implantable Lead Model: 4398
Implantable Lead Model: 5076
Implantable Lead Model: 5076
Implantable Pulse Generator Implant Date: 20200217
Lead Channel Impedance Value: 304 Ohm
Lead Channel Impedance Value: 323 Ohm
Lead Channel Impedance Value: 323 Ohm
Lead Channel Impedance Value: 361 Ohm
Lead Channel Impedance Value: 361 Ohm
Lead Channel Impedance Value: 380 Ohm
Lead Channel Impedance Value: 399 Ohm
Lead Channel Impedance Value: 475 Ohm
Lead Channel Impedance Value: 494 Ohm
Lead Channel Impedance Value: 589 Ohm
Lead Channel Impedance Value: 589 Ohm
Lead Channel Impedance Value: 608 Ohm
Lead Channel Impedance Value: 627 Ohm
Lead Channel Impedance Value: 646 Ohm
Lead Channel Pacing Threshold Amplitude: 0.5 V
Lead Channel Pacing Threshold Amplitude: 1.25 V
Lead Channel Pacing Threshold Amplitude: 1.5 V
Lead Channel Pacing Threshold Pulse Width: 0.4 ms
Lead Channel Pacing Threshold Pulse Width: 0.4 ms
Lead Channel Pacing Threshold Pulse Width: 1 ms
Lead Channel Sensing Intrinsic Amplitude: 0.625 mV
Lead Channel Sensing Intrinsic Amplitude: 0.625 mV
Lead Channel Sensing Intrinsic Amplitude: 16.5 mV
Lead Channel Sensing Intrinsic Amplitude: 16.5 mV
Lead Channel Setting Pacing Amplitude: 2 V
Lead Channel Setting Pacing Amplitude: 2.5 V
Lead Channel Setting Pacing Amplitude: 3 V
Lead Channel Setting Pacing Pulse Width: 0.4 ms
Lead Channel Setting Pacing Pulse Width: 1 ms
Lead Channel Setting Sensing Sensitivity: 1.2 mV

## 2021-07-13 NOTE — Progress Notes (Signed)
Remote pacemaker transmission.   

## 2021-07-20 ENCOUNTER — Other Ambulatory Visit (HOSPITAL_COMMUNITY): Payer: Self-pay | Admitting: Cardiology

## 2021-07-24 IMAGING — CT CT ABD-PELV W/O CM
1 of 3 series · 12 of 32 positions shown, 18 images · non-contrast
Comparison: None

CLINICAL DATA: Abdominal pain, mid abdominal RIGHT upper quadrant
of LEFT upper quadrant pain status post fall 5 days ago.

EXAM:
CT ABDOMEN AND PELVIS WITHOUT CONTRAST
TECHNIQUE: Multidetector CT imaging of the abdomen and pelvis was performed
following the standard protocol without IV contrast.

[Series 2: abd/pelvis w/(date) · axial · 0.69mm/px · z∈[-395,-45]mm · 12 of 84 slices shown, 18 images]
[im 7/84  soft-tissue]
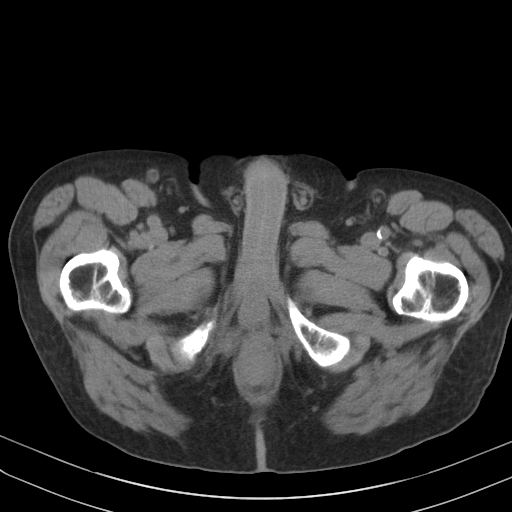
[im 7/84  bone]
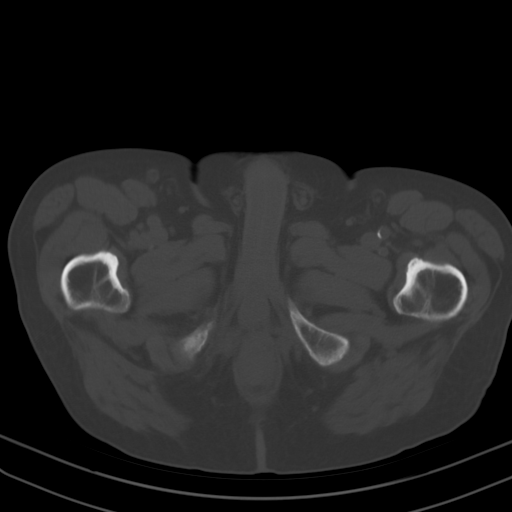
[im 13/84  soft-tissue]
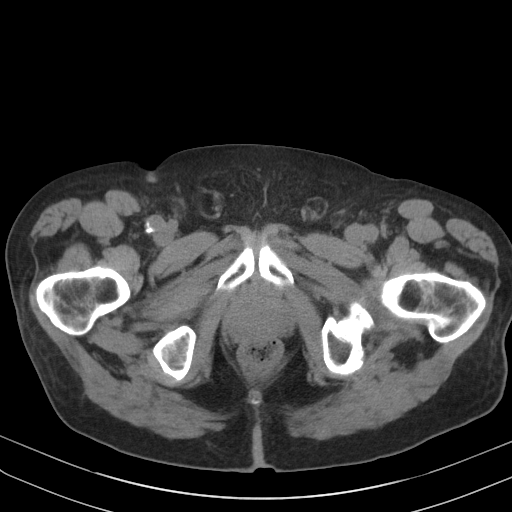
[im 20/84  soft-tissue]
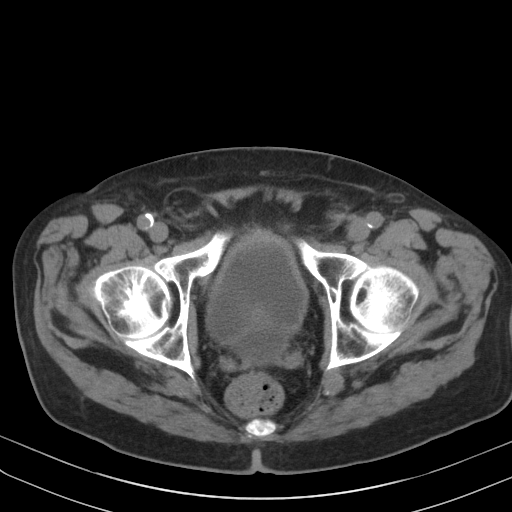
[im 26/84  soft-tissue]
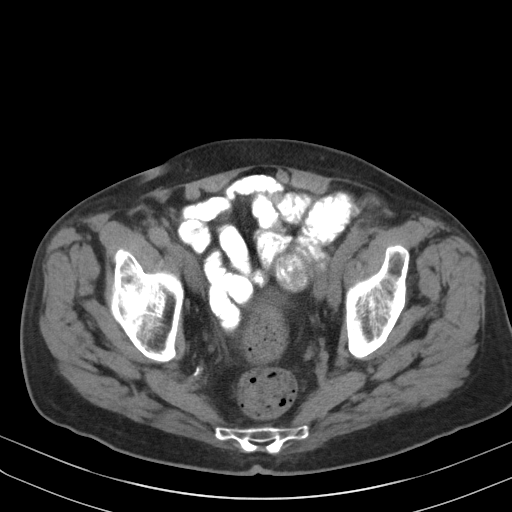
[im 32/84  soft-tissue]
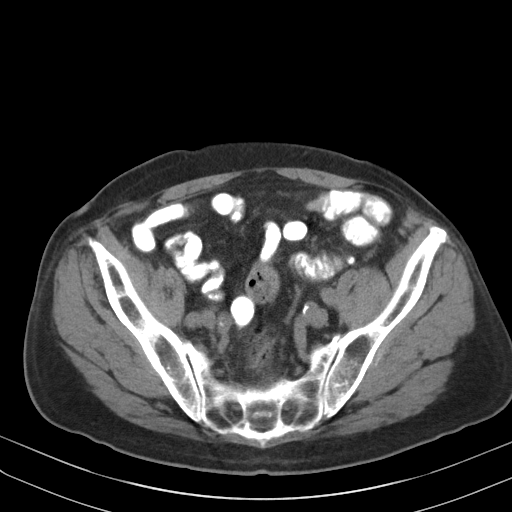
[im 39/84  soft-tissue]
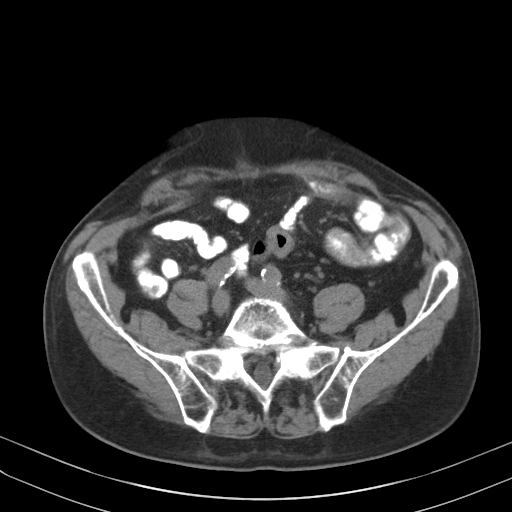
[im 45/84  soft-tissue]
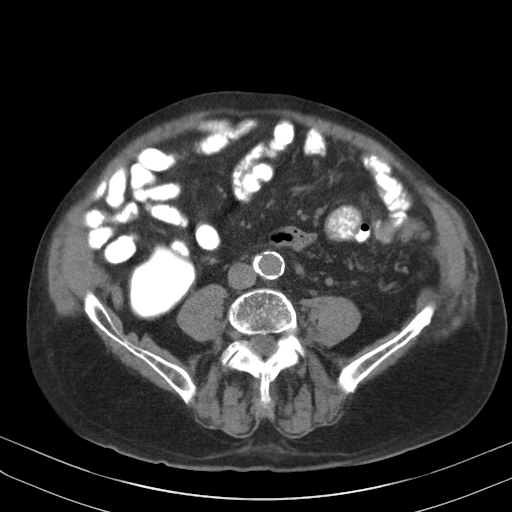
[im 52/84  soft-tissue]
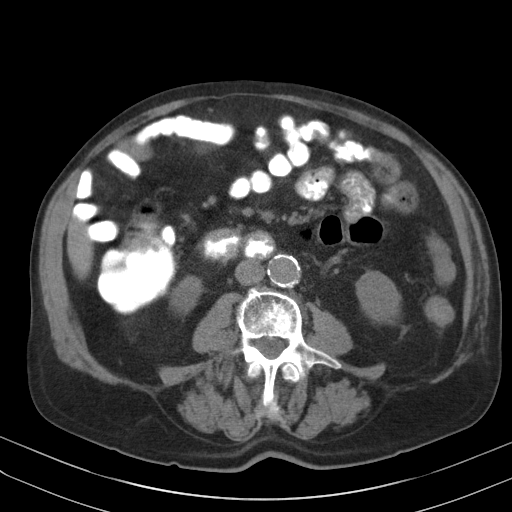
[im 58/84  soft-tissue]
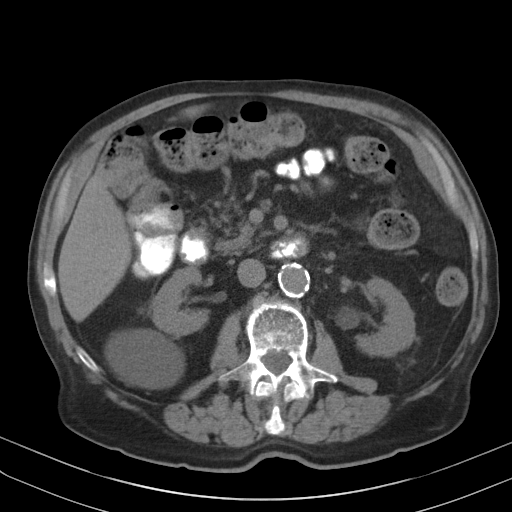
[im 58/84  lung]
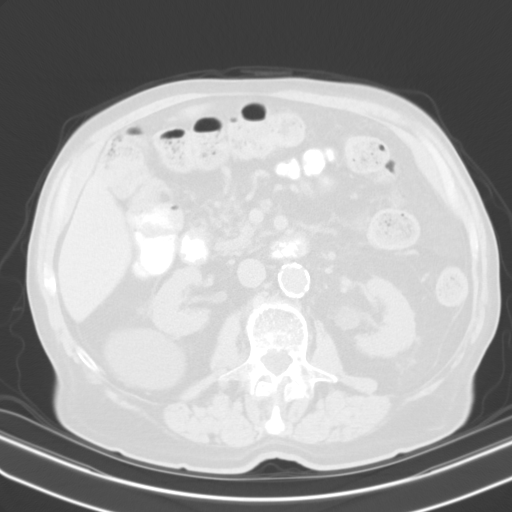
[im 58/84  bone]
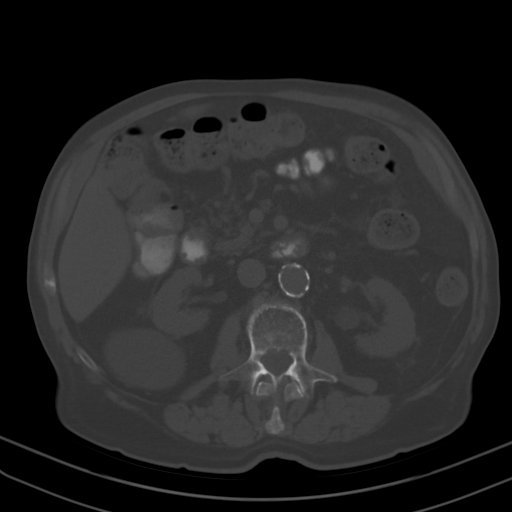
[im 64/84  soft-tissue]
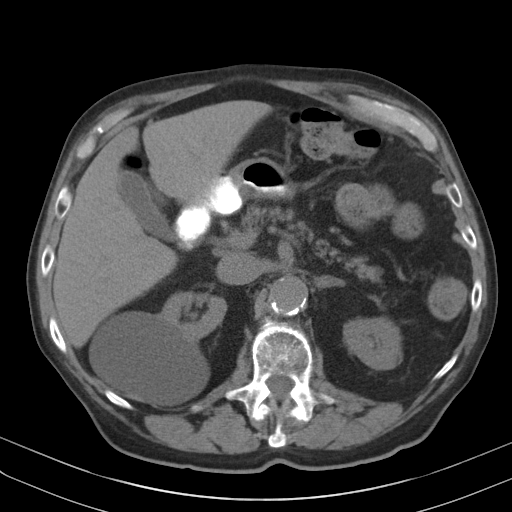
[im 64/84  lung]
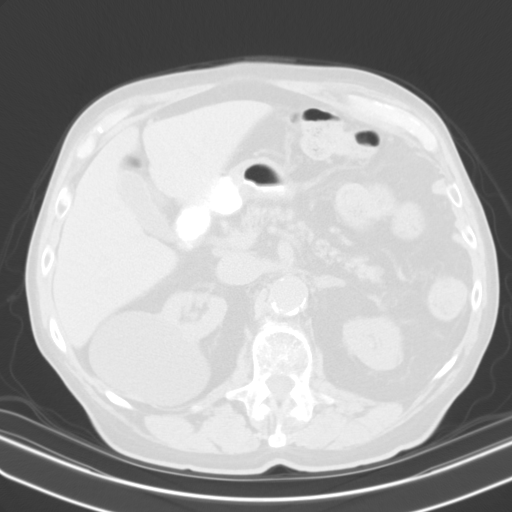
[im 71/84  soft-tissue]
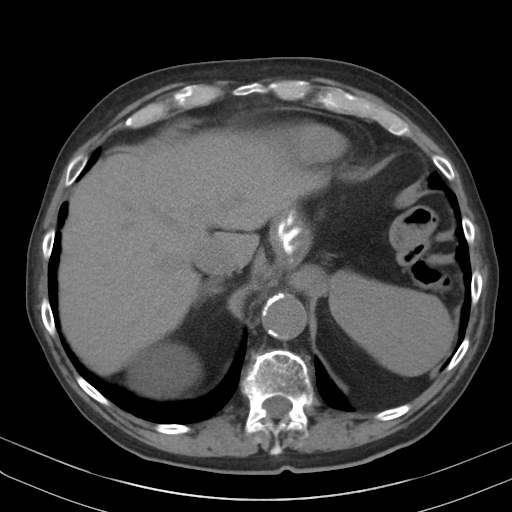
[im 71/84  lung]
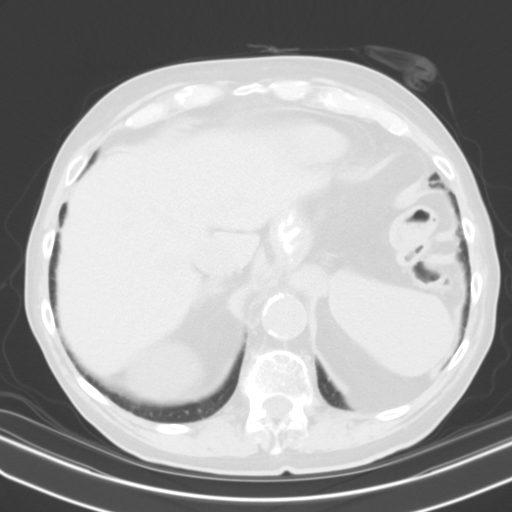
[im 77/84  soft-tissue]
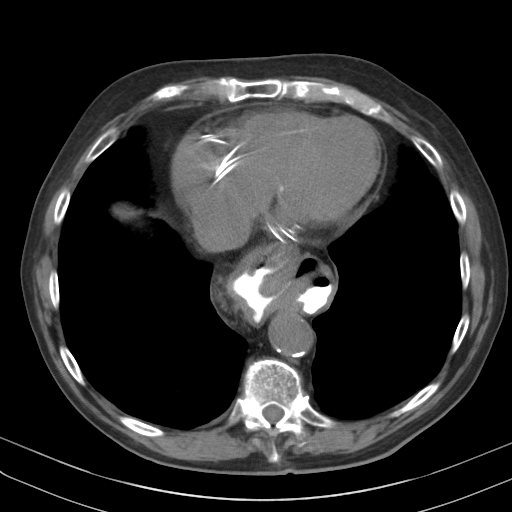
[im 77/84  lung]
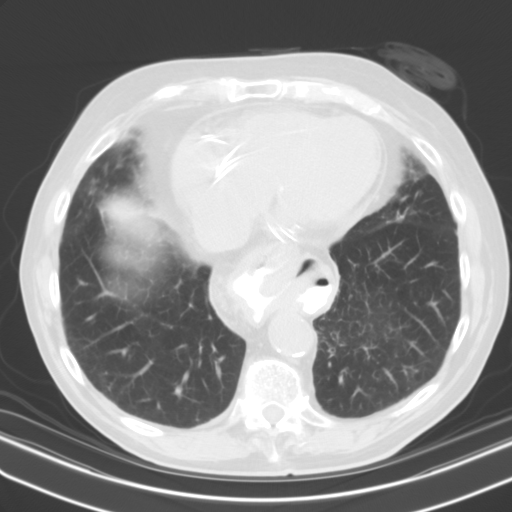

[12 of 32 positions shown; findings below may reference images not displayed]

FINDINGS: Lower chest: 3 lead cardiac pacer device in place. No effusion. No
consolidation.

Hepatobiliary: Hepatic cyst in the LEFT hepatic lobe.

Liver with normal contours.

No signs of pericholecystic stranding. No biliary duct dilation
grossly.

Pancreas: No peripancreatic stranding or contour abnormality.

Spleen: Normal size and contour.

Adrenals/Urinary Tract: Adrenal glands are normal. The no
perinephric stranding. Large RIGHT renal cyst measuring 8.2 by
cm. No nephrolithiasis, hydronephrosis or suspicious renal finding
on noncontrast evaluation. Urinary bladder is under distended.

Stomach/Bowel: Moderately large hiatal hernia extending into the
chest present on previous chest radiographs. Mild gastric thickening
suggested.

Small bowel is normal caliber. No sign of adjacent inflammation.
Colon with no sign of adjacent inflammation or sign of obstruction.
Sigmoid diverticulosis and diverticular disease. Colon under
distended distally with small amount of stool mixed with oral
contrast media proximally.

Vascular/Lymphatic: Aortic atherosclerosis. No aneurysmal dilation
of the abdominal aorta. Smooth contour of the IVC. There is no
gastrohepatic or hepatoduodenal ligament lymphadenopathy. No
retroperitoneal or mesenteric lymphadenopathy.

No pelvic sidewall lymphadenopathy.

Reproductive: Prostate, unremarkable by CT.

Other: Mild omental stranding in the LEFT upper quadrant. No
ascites. No free air.

Musculoskeletal: Spinal degenerative changes. No acute or
destructive bone process.
IMPRESSION: 1. Nonspecific mild stranding and or haziness of the omentum in the
LEFT upper quadrant. May relate to prior inflammation. Could
consider short interval follow-up at 3 months. No free air or
ascites. No signs of acute findings in the abdomen or pelvis.
2. Moderately large hiatal hernia extending into the chest present
on previous chest radiographs. Mild gastric thickening suggested.
Correlate with any symptoms of gastritis.
3. Sigmoid diverticulosis and diverticular disease.
4. Aortic atherosclerosis.

Aortic Atherosclerosis (WDCD8-YOA.A).

## 2021-07-26 ENCOUNTER — Encounter: Payer: Self-pay | Admitting: Internal Medicine

## 2021-07-26 ENCOUNTER — Ambulatory Visit: Payer: PPO | Admitting: Internal Medicine

## 2021-07-26 ENCOUNTER — Other Ambulatory Visit: Payer: Self-pay

## 2021-07-26 VITALS — BP 112/64 | HR 87 | Ht 65.0 in | Wt 153.4 lb

## 2021-07-26 DIAGNOSIS — I5022 Chronic systolic (congestive) heart failure: Secondary | ICD-10-CM | POA: Diagnosis not present

## 2021-07-26 DIAGNOSIS — I4819 Other persistent atrial fibrillation: Secondary | ICD-10-CM | POA: Diagnosis not present

## 2021-07-26 NOTE — Progress Notes (Signed)
HPI Mr. Fonda returns today for ongoing evaluation and management of CHB, CAD, and atrial fib. He is s/p biv PPM insertion due to all of the above and advanced age. He has done well in the interim. He has class 2 symptoms. He does not have palpitations. No syncope. He has 2+ edema. No Known Allergies   Current Outpatient Medications  Medication Sig Dispense Refill   apixaban (ELIQUIS) 2.5 MG TABS tablet Take 1 tablet (2.5 mg total) by mouth 2 (two) times daily. 180 tablet 3   carvedilol (COREG) 3.125 MG tablet TAKE  (1)  TABLET TWICE A DAY. 180 tablet 3   cholecalciferol (VITAMIN D) 1000 UNITS tablet Take 1,000 Units by mouth daily. 2 tablets     furosemide (LASIX) 20 MG tablet TAKE 1 TABLET DAILY 120 tablet 0   pantoprazole (PROTONIX) 40 MG tablet Take 1 tablet (40 mg total) by mouth daily. Take 30 min before breakfast 90 tablet 3   simvastatin (ZOCOR) 40 MG tablet Take 1 tablet (40 mg total) by mouth at bedtime. 90 tablet 3   spironolactone (ALDACTONE) 25 MG tablet TAKE (1/2) TABLET DAILY. 45 tablet 3   vitamin B-12 (CYANOCOBALAMIN) 500 MCG tablet Take 1 tablet (500 mcg total) by mouth daily.     No current facility-administered medications for this visit.     Past Medical History:  Diagnosis Date   CHF (congestive heart failure) (HCC)    Coronary artery disease    History of kidney stones    years ago   Hyperlipidemia     ROS:   All systems reviewed and negative except as noted in the HPI.   Past Surgical History:  Procedure Laterality Date   BIOPSY  07/05/2020   Procedure: BIOPSY;  Surgeon: Harvel Quale, MD;  Location: [facility];  Service: Gastroenterology;;   BIV PACEMAKER INSERTION CRT-P N/A 12/22/2018   Procedure: BIV PACEMAKER INSERTION CRT-P;  Surgeon: Evans Lance, MD;  Location: [facility];  Service: Cardiovascular;  Laterality: N/A;   CARDIAC CATHETERIZATION     ESOPHAGOGASTRODUODENOSCOPY (EGD) WITH PROPOFOL N/A 07/05/2020    Procedure: ESOPHAGOGASTRODUODENOSCOPY (EGD) WITH PROPOFOL;  Surgeon: Harvel Quale, MD;  Location: [facility];  Service: Gastroenterology;  Laterality: N/A;   HEMORRHOID SURGERY     removed a hemorrhoid in the 1970s   KIDNEY STONE SURGERY     remove a kidney stone     Family History  Problem Relation Age of Onset   Heart attack Father        in his 33's   Cancer Brother        1/2   Cancer Sister    Heart Problems Brother 44       pacemaker 2/2     Social History   Socioeconomic History   Marital status: Married    Spouse name: Not on file   Number of children: Not on file   Years of education: Not on file   Highest education level: Not on file  Occupational History   Not on file  Tobacco Use   Smoking status: Former   Smokeless tobacco: Never  Substance and Sexual Activity   Alcohol use: No   Drug use: No   Sexual activity: Not on file  Other Topics Concern   Not on file  Social History Narrative   He is married, has 1-son. He does not smoke, or use alcohol   Social Determinants of Health  Financial Resource Strain: Not on file  Food Insecurity: Not on file  Transportation Needs: Not on file  Physical Activity: Not on file  Stress: Not on file  Social Connections: Not on file  Intimate Partner Violence: Not on file     BP 112/64   Pulse 87   Ht 5\' 5"  (1.651 m)   Wt 153 lb 6.4 oz (69.6 kg)   SpO2 95%   BMI 25.53 kg/m   Physical Exam:  Well appearing NAD HEENT: Unremarkable Neck:  No JVD, no thyromegally Lymphatics:  No adenopathy Back:  No CVA tenderness Lungs:  Clear with no wheezes HEART:  Regular rate rhythm, no murmurs, no rubs, no clicks Abd:  soft, positive bowel sounds, no organomegally, no rebound, no guarding Ext:  2 plus pulses, 2+ edema, no cyanosis, no clubbing Skin:  No rashes no nodules Neuro:  CN II through XII intact, motor grossly intact   DEVICE  Normal device function.  See PaceArt for details.    Assess/Plan:  1. Atrial fib - his VR is well controlled. He is tolerating his current meds. 2. Hypotension - he was in the ED and had his meds reduced. His bp is back to normal. 3. Chronic systolic heart failure - his symptoms are class 2. He will continue his current meds. 4. CHB - he is asymptomatic, s/p PPM insertion. His medtronic biv PPM is working normally.   Carleene Overlie Livana Yerian,MD

## 2021-07-26 NOTE — Patient Instructions (Signed)
Medication Instructions:  Your physician recommends that you continue on your current medications as directed. Please refer to the Current Medication list given to you today.  *If you need a refill on your cardiac medications before your next appointment, please call your pharmacy*   Lab Work: none If you have labs (blood work) drawn today and your tests are completely normal, you will receive your results only by: [place] (if you have MyChart) OR A paper copy in the mail If you have any lab test that is abnormal or we need to change your treatment, we will call you to review the results.   Testing/Procedures: none   Follow-Up: At [facility], you and your health needs are our priority.  As part of our continuing mission to provide you with exceptional heart care, we have created designated Provider Care Teams.  These Care Teams include your primary Cardiologist (physician) and Advanced Practice Providers (APPs -  Physician Assistants and Nurse Practitioners) who all work together to provide you with the care you need, when you need it.  We recommend signing up for the patient portal called "MyChart".  Sign up information is provided on this After Visit Summary.  MyChart is used to connect with patients for Virtual Visits (Telemedicine).  Patients are able to view lab/test results, encounter notes, upcoming appointments, etc.  Non-urgent messages can be sent to your provider as well.   To learn more about what you can do with MyChart, go to [email or web address].    Your next appointment:   1 year(s)  The format for your next appointment:   In Person  Provider:   Cristopher Peru, MD   Other Instructions

## 2021-07-27 ENCOUNTER — Other Ambulatory Visit (INDEPENDENT_AMBULATORY_CARE_PROVIDER_SITE_OTHER): Payer: Self-pay

## 2021-07-27 ENCOUNTER — Other Ambulatory Visit (HOSPITAL_COMMUNITY): Payer: Self-pay | Admitting: Cardiology

## 2021-07-27 DIAGNOSIS — R634 Abnormal weight loss: Secondary | ICD-10-CM

## 2021-07-27 DIAGNOSIS — R110 Nausea: Secondary | ICD-10-CM

## 2021-07-27 DIAGNOSIS — K921 Melena: Secondary | ICD-10-CM

## 2021-07-27 DIAGNOSIS — R197 Diarrhea, unspecified: Secondary | ICD-10-CM

## 2021-07-27 MED ORDER — PANTOPRAZOLE SODIUM 40 MG PO TBEC: 40.0000 mg | DELAYED_RELEASE_TABLET | Freq: Every day | ORAL | 0 refills | Status: DC

## 2021-08-04 DIAGNOSIS — I129 Hypertensive chronic kidney disease with stage 1 through stage 4 chronic kidney disease, or unspecified chronic kidney disease: Secondary | ICD-10-CM | POA: Diagnosis not present

## 2021-08-04 DIAGNOSIS — I2510 Atherosclerotic heart disease of native coronary artery without angina pectoris: Secondary | ICD-10-CM | POA: Diagnosis not present

## 2021-08-04 DIAGNOSIS — N1831 Chronic kidney disease, stage 3a: Secondary | ICD-10-CM | POA: Diagnosis not present

## 2021-08-04 DIAGNOSIS — I4891 Unspecified atrial fibrillation: Secondary | ICD-10-CM | POA: Diagnosis not present

## 2021-08-29 ENCOUNTER — Other Ambulatory Visit: Payer: Self-pay | Admitting: Internal Medicine

## 2021-08-29 ENCOUNTER — Other Ambulatory Visit (INDEPENDENT_AMBULATORY_CARE_PROVIDER_SITE_OTHER): Payer: Self-pay | Admitting: Gastroenterology

## 2021-08-29 DIAGNOSIS — R634 Abnormal weight loss: Secondary | ICD-10-CM

## 2021-08-29 DIAGNOSIS — R197 Diarrhea, unspecified: Secondary | ICD-10-CM

## 2021-08-29 DIAGNOSIS — R110 Nausea: Secondary | ICD-10-CM

## 2021-08-29 DIAGNOSIS — K921 Melena: Secondary | ICD-10-CM

## 2021-09-21 ENCOUNTER — Other Ambulatory Visit (HOSPITAL_COMMUNITY): Payer: Self-pay | Admitting: Cardiology

## 2021-09-26 ENCOUNTER — Other Ambulatory Visit (INDEPENDENT_AMBULATORY_CARE_PROVIDER_SITE_OTHER): Payer: Self-pay | Admitting: Internal Medicine

## 2021-09-26 DIAGNOSIS — K921 Melena: Secondary | ICD-10-CM

## 2021-09-26 DIAGNOSIS — R197 Diarrhea, unspecified: Secondary | ICD-10-CM

## 2021-09-26 DIAGNOSIS — R110 Nausea: Secondary | ICD-10-CM

## 2021-09-26 DIAGNOSIS — R634 Abnormal weight loss: Secondary | ICD-10-CM

## 2021-09-26 NOTE — Telephone Encounter (Signed)
Last refill had a note that she needed office visit before further refills. Last seen sept 2021. No upcoming appt. Mark Lane please schedule visit

## 2021-09-27 ENCOUNTER — Ambulatory Visit (INDEPENDENT_AMBULATORY_CARE_PROVIDER_SITE_OTHER): Payer: PPO

## 2021-09-27 DIAGNOSIS — I442 Atrioventricular block, complete: Secondary | ICD-10-CM

## 2021-09-27 LAB — CUP PACEART REMOTE DEVICE CHECK
Battery Remaining Longevity: 31 mo
Battery Voltage: 2.92 V
Brady Statistic RA Percent Paced: 1.03 %
Brady Statistic RV Percent Paced: 96.67 %
Date Time Interrogation Session: 20221122205124
Implantable Lead Implant Date: 20200217
Implantable Lead Implant Date: 20200217
Implantable Lead Implant Date: 20200217
Implantable Lead Location: 753858
Implantable Lead Location: 753859
Implantable Lead Location: 753860
Implantable Lead Model: 4398
Implantable Lead Model: 5076
Implantable Lead Model: 5076
Implantable Pulse Generator Implant Date: 20200217
Lead Channel Impedance Value: 304 Ohm
Lead Channel Impedance Value: 304 Ohm
Lead Channel Impedance Value: 323 Ohm
Lead Channel Impedance Value: 361 Ohm
Lead Channel Impedance Value: 380 Ohm
Lead Channel Impedance Value: 399 Ohm
Lead Channel Impedance Value: 399 Ohm
Lead Channel Impedance Value: 456 Ohm
Lead Channel Impedance Value: 494 Ohm
Lead Channel Impedance Value: 589 Ohm
Lead Channel Impedance Value: 589 Ohm
Lead Channel Impedance Value: 608 Ohm
Lead Channel Impedance Value: 646 Ohm
Lead Channel Impedance Value: 684 Ohm
Lead Channel Pacing Threshold Amplitude: 0.5 V
Lead Channel Pacing Threshold Amplitude: 1.25 V
Lead Channel Pacing Threshold Amplitude: 1.625 V
Lead Channel Pacing Threshold Pulse Width: 0.4 ms
Lead Channel Pacing Threshold Pulse Width: 0.4 ms
Lead Channel Pacing Threshold Pulse Width: 1 ms
Lead Channel Sensing Intrinsic Amplitude: 0.5 mV
Lead Channel Sensing Intrinsic Amplitude: 0.5 mV
Lead Channel Sensing Intrinsic Amplitude: 10 mV
Lead Channel Sensing Intrinsic Amplitude: 10 mV
Lead Channel Setting Pacing Amplitude: 1.75 V
Lead Channel Setting Pacing Amplitude: 2.5 V
Lead Channel Setting Pacing Amplitude: 4.25 V
Lead Channel Setting Pacing Pulse Width: 0.4 ms
Lead Channel Setting Pacing Pulse Width: 1 ms
Lead Channel Setting Sensing Sensitivity: 1.2 mV

## 2021-10-09 NOTE — Progress Notes (Signed)
Remote pacemaker transmission.   

## 2021-10-23 ENCOUNTER — Other Ambulatory Visit: Payer: Self-pay

## 2021-10-23 ENCOUNTER — Other Ambulatory Visit (INDEPENDENT_AMBULATORY_CARE_PROVIDER_SITE_OTHER): Payer: Self-pay | Admitting: Internal Medicine

## 2021-10-23 ENCOUNTER — Encounter (HOSPITAL_COMMUNITY): Payer: Self-pay | Admitting: Cardiology

## 2021-10-23 ENCOUNTER — Ambulatory Visit (HOSPITAL_COMMUNITY)
Admission: RE | Admit: 2021-10-23 | Discharge: 2021-10-23 | Disposition: A | Discharge status: Home / Self Care | Payer: PPO | Source: Ambulatory Visit | Attending: Cardiology | Admitting: Cardiology

## 2021-10-23 VITALS — BP 104/60 | HR 60 | Wt 153.2 lb

## 2021-10-23 DIAGNOSIS — Z79899 Other long term (current) drug therapy: Secondary | ICD-10-CM | POA: Insufficient documentation

## 2021-10-23 DIAGNOSIS — I442 Atrioventricular block, complete: Secondary | ICD-10-CM | POA: Insufficient documentation

## 2021-10-23 DIAGNOSIS — R110 Nausea: Secondary | ICD-10-CM

## 2021-10-23 DIAGNOSIS — R634 Abnormal weight loss: Secondary | ICD-10-CM

## 2021-10-23 DIAGNOSIS — I482 Chronic atrial fibrillation, unspecified: Secondary | ICD-10-CM | POA: Insufficient documentation

## 2021-10-23 DIAGNOSIS — Z7901 Long term (current) use of anticoagulants: Secondary | ICD-10-CM | POA: Insufficient documentation

## 2021-10-23 DIAGNOSIS — I252 Old myocardial infarction: Secondary | ICD-10-CM | POA: Diagnosis not present

## 2021-10-23 DIAGNOSIS — R197 Diarrhea, unspecified: Secondary | ICD-10-CM

## 2021-10-23 DIAGNOSIS — R0789 Other chest pain: Secondary | ICD-10-CM | POA: Diagnosis not present

## 2021-10-23 DIAGNOSIS — E785 Hyperlipidemia, unspecified: Secondary | ICD-10-CM | POA: Diagnosis not present

## 2021-10-23 DIAGNOSIS — I2510 Atherosclerotic heart disease of native coronary artery without angina pectoris: Secondary | ICD-10-CM | POA: Insufficient documentation

## 2021-10-23 DIAGNOSIS — I5022 Chronic systolic (congestive) heart failure: Secondary | ICD-10-CM | POA: Diagnosis not present

## 2021-10-23 DIAGNOSIS — Z950 Presence of cardiac pacemaker: Secondary | ICD-10-CM | POA: Insufficient documentation

## 2021-10-23 DIAGNOSIS — K921 Melena: Secondary | ICD-10-CM

## 2021-10-23 LAB — BASIC METABOLIC PANEL
Anion gap: 5 (ref 5–15)
BUN: 14 mg/dL (ref 8–23)
CO2: 29 mmol/L (ref 22–32)
Calcium: 9 mg/dL (ref 8.9–10.3)
Chloride: 106 mmol/L (ref 98–111)
Creatinine, Ser: 1.47 mg/dL — ABNORMAL HIGH (ref 0.61–1.24)
GFR, Estimated: 43 mL/min — ABNORMAL LOW (ref >60)
Glucose, Bld: 99 mg/dL (ref 70–99)
Potassium: 4.4 mmol/L (ref 3.5–5.1)
Sodium: 140 mmol/L (ref 135–145)

## 2021-10-23 LAB — BRAIN NATRIURETIC PEPTIDE: B Natriuretic Peptide: 454.1 pg/mL — ABNORMAL HIGH (ref 0.0–100.0)

## 2021-10-23 MED ORDER — FUROSEMIDE 20 MG PO TABS: ORAL_TABLET | ORAL | 2 refills | Status: DC

## 2021-10-23 NOTE — Progress Notes (Signed)
Medication Samples have been provided to the patient.  Drug name: Eliquis       Strength: 2.5 mg        Qty: 1  LOT: [id number]  Exp.Date: 07/2023  Dosing instructions: Take 1 tablet Twice daily   The patient has been instructed regarding the correct time, dose, and frequency of taking this medication, including desired effects and most common side effects.   Juanita Laster Janiesha Diehl 3:29 PM 10/23/2021

## 2021-10-23 NOTE — Patient Instructions (Addendum)
Labs done today. We will contact you only if your labs are abnormal.  INCREASE Lasix to 20mg  (1 tablet) by mouth daily alternating with 40mg  (2 tablets) daily.  No other medication changes were made. Please continue all current medications as prescribed.  Your physician recommends that you schedule a follow-up appointment in: 10 days for a lab only appointment(a paper prescription was provided to you during your appointment) and in 4 months with Dr. Aundra Dubin. Please contact our office in March 2023 to schedule a April 2023 appointment.   If you have any questions or concerns before your next appointment please send [place] a message through [place] or call our office at [phone].    TO LEAVE A MESSAGE FOR THE NURSE SELECT OPTION 2, PLEASE LEAVE A MESSAGE INCLUDING: YOUR NAME DATE OF BIRTH CALL BACK NUMBER REASON FOR CALL**this is important as we prioritize the call backs  YOU WILL RECEIVE A CALL BACK THE SAME DAY AS LONG AS YOU CALL BEFORE 4:00 PM   Do the following things EVERYDAY: Weigh yourself in the morning before breakfast. Write it down and keep it in a log. Take your medicines as prescribed Eat low salt foods--Limit salt (sodium) to 2000 mg per day.  Stay as active as you can everyday Limit all fluids for the day to less than 2 liters   At the [facility], you and your health needs are our priority. As part of our continuing mission to provide you with exceptional heart care, we have created designated Provider Care Teams. These Care Teams include your primary Cardiologist (physician) and Advanced Practice Providers (APPs- Physician Assistants and Nurse Practitioners) who all work together to provide you with the care you need, when you need it.   You may see any of the following providers on your designated Care Team at your next follow up: Dr Glori Bickers Dr Haynes Kerns, NP Lyda Jester, [place] Audry Riles, PharmD   Please be sure to  bring in all your medications bottles to every appointment.

## 2021-10-25 NOTE — Progress Notes (Signed)
Patient ID: Mark Lane, male   DOB: May 09, 1922, 87 y.o.   MRN: [id number] PCP: Dr. Forde Dandy Cardiology: Dr. Aundra Dubin  87 y.o.with history of CAD and ischemic cardiomyopathy presents for followup of CAD and CHF.  He had an anterolateral STEMI in 7/11 with ostial occlusion of a large 2nd diagonal that was treated with BMS.  EF immediately post-MI was 35%.  He has also been noted to have Mobitz Type I 2nd degree AV block at times.  Last echo in 7/14 showed EF 30-35% with septal/apical akinesis and anterior hypokinesis.    In 2/20, he developed complete heart block and had implantation of a Medtronic CRT-P device.  He subsequently developed atrial fibrillation which has become chronic.   Echo was done in 9/20, showing EF 45-50%, mild LVH, normal RV.   Echo in 8/21 showed EF 50%, normal RV.   He is stable symptomatically.  "Slowing down" but still able to help his disabled wife around the house.  Some difficulty with balancing, no falls.  No chest pain.  Short of breath if he walks a long distance.  No orthopnea/PND.    Medtronic device interrogation: atrial fibrillation chronically, thoracic impedance trending down but fluid index < threshold, 99% BiV pacing.   Labs (6/13): LDL 68, HDL 40 Labs (2/14): K 3.9, creatinine 1.2 Labs (8/14): K 4.1, creatinine 1.4 Labs (12/14): creatinine 1.3 Labs (4/15): K 4.9, creatinine 1.92, BNP 494.6 Labs (9/15): K 3.9, creatinine 1.4, LDL 133, HDL 43 Labs (3/16): K 3.9, creatinine 1.22, LDL 76 Labs (10/16): K 3.8, creatinine 1.15 Labs (3/18): K 3.4, creatinine 1.22 Labs (7/18): LDL 43, HDL 43, LFTs normal Labs (10/18): K 4.1, creatinine 1.17, BNP 298 Labs (6/20): K 4.4, creatinine 1.27, hgb 11.7 Labs (9/20): K 4, creatinine 1.34, LDL 60 Labs (12/20): K 4.3, creatinine 1.64 Labs (8/21): hgb 12.8, K 3.7, creatinine 1.64, LFTs normal, TSH normal Labs (11/21): K 3.9, creatinine 1.47 Labs (4/22): LDL 74, HDL 51, K 4.5, creatinine 1.52 Labs (8/22): hgb 12.6, K 4.3,  creatinine 1.54, BNP 354  PMH: 1. Hyperlipidemia 2. Nephrolithiasis 3. CAD: Anterolateral STEMI in 7/11.  LHC with ostial total occlusion of a large D2, dominant LCx, small RCA.  Patient had BMS to D2.  4. Complete heart block: Has Medtronic CRT-P device.  5. Ischemic cardiomyopathy: Echo (7/11) with EF 35% and peri-apical akinesis, grade II diastolic dysfunction, normal RV size and systolic function.  Echo (7/14) with EF 30-35%, septal/apical akinesis, anterior hypokinesis, mild MR/AI.  - Echo (9/20): EF 45-50%, mild LVH, normal RV.  - Echo (8/21): EF 50%, normal RV 6. CKD 7. Atrial fibrillation: Chronic.  8. Chronic gastritis  SH: Married, lives in [place], prior tobacco smoker.  Has a farm, grows peaches. World War 2 veteran.   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 180 tablet 3   carvedilol (COREG) 3.125 MG tablet TAKE  (1)  TABLET TWICE A DAY. 180 tablet 3   cholecalciferol (VITAMIN D) 1000 UNITS tablet Take 1,000 Units by mouth daily. 2 tablets     pantoprazole (PROTONIX) 40 MG tablet TAKE ONE TABLET ONCE DAILY 30 MINUTES BEFORE BREAKFAST 30 tablet 0   simvastatin (ZOCOR) 40 MG tablet Take 1 tablet (40 mg total) by mouth at bedtime. 90 tablet 3   spironolactone (ALDACTONE) 25  MG tablet TAKE (1/2) TABLET DAILY. 45 tablet 3   vitamin B-12 (CYANOCOBALAMIN) 500 MCG tablet Take 1 tablet (500 mcg total) by mouth daily.     furosemide (LASIX) 20 MG tablet Take 20mg  (1 tablet) by mouth daily alternating with 40mg  (2 tablets) daily. 120 tablet 2   No current facility-administered medications for this encounter.   BP 104/60    Pulse 60    Wt 69.5 kg (153 lb 3.2 oz)    SpO2 98%    BMI 25.49 kg/m  General: NAD Neck: JVP 8-9 cm, no thyromegaly or thyroid nodule.  Lungs: Crackles at bases. CV:  Nondisplaced PMI.  Heart regular S1/S2, no S3/S4, no murmur.  1+ ankle edema.  No carotid bruit.  Normal pedal pulses.  Abdomen: Soft, nontender, no hepatosplenomegaly, no distention.  Skin: Intact without lesions or rashes.  Neurologic: Alert and oriented x 3.  Psych: Normal affect. Extremities: No clubbing or cyanosis.  HEENT: Normal.   Assessment/Plan: 1. CAD: Atypical chest pain.   - No ASA given stable CAD and Eliquis use.  - Continue statin.  2. Chronic systolic CHF: Ischemic cardiomyopathy.  Now with Medtronic CRT-P device.  Echo in 8/21 with EF up to 50% with normal RV.  NYHA class 2-3 symptoms with mild volume overload on exam.    - Increase Lasix to 40 daily alternating with 20 daily.  BMET/BNP today and BMET in 10 days.  - Continue spironolactone 12.5 daily.  - Continue Coreg 3.125 mg bid.  - Off lisinopril with soft BP.   3. Hyperlipidemia:  Good lipids in 4/22.  4. Complete heart block: S/p MDT CRT-P device placement.  5. Atrial fibrillation: Chronic.   - Continue apixaban 2.5 mg bid (age, creatinine > 1.5).    Followup in 4 months.   [name] 10/25/2021

## 2021-10-31 ENCOUNTER — Ambulatory Visit (INDEPENDENT_AMBULATORY_CARE_PROVIDER_SITE_OTHER): Payer: PPO | Admitting: Gastroenterology

## 2021-10-31 ENCOUNTER — Encounter (INDEPENDENT_AMBULATORY_CARE_PROVIDER_SITE_OTHER): Payer: Self-pay | Admitting: Gastroenterology

## 2021-11-23 DIAGNOSIS — E663 Overweight: Secondary | ICD-10-CM | POA: Diagnosis not present

## 2021-11-23 DIAGNOSIS — E261 Secondary hyperaldosteronism: Secondary | ICD-10-CM | POA: Diagnosis not present

## 2021-11-23 DIAGNOSIS — D6869 Other thrombophilia: Secondary | ICD-10-CM | POA: Diagnosis not present

## 2021-11-23 DIAGNOSIS — K5900 Constipation, unspecified: Secondary | ICD-10-CM | POA: Diagnosis not present

## 2021-11-23 DIAGNOSIS — I5022 Chronic systolic (congestive) heart failure: Secondary | ICD-10-CM | POA: Diagnosis not present

## 2021-11-23 DIAGNOSIS — K219 Gastro-esophageal reflux disease without esophagitis: Secondary | ICD-10-CM | POA: Diagnosis not present

## 2021-11-23 DIAGNOSIS — Z7901 Long term (current) use of anticoagulants: Secondary | ICD-10-CM | POA: Diagnosis not present

## 2021-11-23 DIAGNOSIS — I110 Hypertensive heart disease with heart failure: Secondary | ICD-10-CM | POA: Diagnosis not present

## 2021-11-23 DIAGNOSIS — N1831 Chronic kidney disease, stage 3a: Secondary | ICD-10-CM | POA: Diagnosis not present

## 2021-11-23 DIAGNOSIS — E785 Hyperlipidemia, unspecified: Secondary | ICD-10-CM | POA: Diagnosis not present

## 2021-11-23 DIAGNOSIS — I4891 Unspecified atrial fibrillation: Secondary | ICD-10-CM | POA: Diagnosis not present

## 2021-11-23 DIAGNOSIS — E559 Vitamin D deficiency, unspecified: Secondary | ICD-10-CM | POA: Diagnosis not present

## 2021-11-29 DIAGNOSIS — H31001 Unspecified chorioretinal scars, right eye: Secondary | ICD-10-CM | POA: Diagnosis not present

## 2021-11-29 DIAGNOSIS — H35373 Puckering of macula, bilateral: Secondary | ICD-10-CM | POA: Diagnosis not present

## 2021-11-29 DIAGNOSIS — Z961 Presence of intraocular lens: Secondary | ICD-10-CM | POA: Diagnosis not present

## 2021-11-29 DIAGNOSIS — H52203 Unspecified astigmatism, bilateral: Secondary | ICD-10-CM | POA: Diagnosis not present

## 2021-11-30 ENCOUNTER — Other Ambulatory Visit (INDEPENDENT_AMBULATORY_CARE_PROVIDER_SITE_OTHER): Payer: Self-pay | Admitting: Internal Medicine

## 2021-11-30 DIAGNOSIS — R110 Nausea: Secondary | ICD-10-CM

## 2021-11-30 DIAGNOSIS — R634 Abnormal weight loss: Secondary | ICD-10-CM

## 2021-11-30 DIAGNOSIS — R197 Diarrhea, unspecified: Secondary | ICD-10-CM

## 2021-11-30 DIAGNOSIS — K921 Melena: Secondary | ICD-10-CM

## 2021-12-16 ENCOUNTER — Other Ambulatory Visit (HOSPITAL_COMMUNITY): Payer: Self-pay | Admitting: Cardiology

## 2021-12-16 DIAGNOSIS — I442 Atrioventricular block, complete: Secondary | ICD-10-CM

## 2021-12-25 ENCOUNTER — Encounter (INDEPENDENT_AMBULATORY_CARE_PROVIDER_SITE_OTHER): Payer: Self-pay | Admitting: Gastroenterology

## 2021-12-25 ENCOUNTER — Other Ambulatory Visit: Payer: Self-pay

## 2021-12-25 ENCOUNTER — Ambulatory Visit (INDEPENDENT_AMBULATORY_CARE_PROVIDER_SITE_OTHER): Payer: PPO | Admitting: Gastroenterology

## 2021-12-25 VITALS — BP 116/69 | HR 79 | Temp 97.5°F | Ht 65.0 in | Wt 149.6 lb

## 2021-12-25 DIAGNOSIS — K5900 Constipation, unspecified: Secondary | ICD-10-CM

## 2021-12-25 DIAGNOSIS — K219 Gastro-esophageal reflux disease without esophagitis: Secondary | ICD-10-CM

## 2021-12-25 MED ORDER — PANTOPRAZOLE SODIUM 40 MG PO TBEC: 40.0000 mg | DELAYED_RELEASE_TABLET | Freq: Every day | ORAL | 3 refills | Status: DC

## 2021-12-25 NOTE — Patient Instructions (Signed)
Please continue protonix 40mg  once daily, take this 30-45 minutes prior to breakfast, make sure you are staying upright 2-3 hours after eating, prior to lying down. As far as your constipation, make sure you are eating a diet high in fruits, veggies, whole grains and drinking plenty of water. Aim for 6-8 eight oz glasses per day. Kiwi and prunes are both really good for constipation. If you are noticing that you are still having issues, or going more than 2 days without a BM, you can try over the counter metamucil.  Follow up 1 year

## 2021-12-25 NOTE — Progress Notes (Signed)
Referring Provider: Reynold Bowen, MD Primary Care Physician:  Reynold Bowen, MD Primary GI Physician: Jenetta Downer  Chief Complaint  Patient presents with   Gastroesophageal Reflux    Follow up on GERD. Takes protonix. Working well. No concerns today. Needs refill.    HPI:   Mark Lane is a 88 y.o. male with past medical history of CHF, CAD, HLD.  Patient presenting today for follow up of GERD.   Patient arrives today with his son who helps provide history. He is Currently maintained on Protonix 40mg  once daily. Feels that he is doing well on this. Does not seem to  notice any issues with heartburn or acid reflux. Denies any abdominal pain, nausea or vomiting. Weight is stable and appetite is good. Has occasional issues with dysphagia, however, he reports that he is missing many of his teeth so he has to eat very slowly and chew thoroughly. Does not [place]  He does endorse some constipation. Denies any rectal bleeding or melena.  States that he may go 3-4 days without having a BM and has to strain a bit to get stool to pass. He reports that he probably does not drink enough water, he eats a diet high in meats and potatoes mostly but does like fruits and veggies. He usually takes prune juice to help with his constipation which seems to provide good results. Has not taken anything else in the past for constipation.    Last Endoscopy:07/05/20 - 4 cm hiatal hernia. - Nodular mucosa in the gastric body. Biopsied. - A few gastric polyps. - Normal examined duodenum. Neg H pylori  Past Medical History:  Diagnosis Date   CHF (congestive heart failure) ([place])    Coronary artery disease    History of kidney stones    years ago   Hyperlipidemia     Past Surgical History:  Procedure Laterality Date   BIOPSY  07/05/2020   Procedure: BIOPSY;  Surgeon: Harvel Quale, MD;  [facility];  Service: Gastroenterology;;   BIV PACEMAKER INSERTION CRT-P N/A 12/22/2018   Procedure:  BIV PACEMAKER INSERTION CRT-P;  Surgeon: Evans Lance, MD;  Location: [facility];  Service: Cardiovascular;  Laterality: N/A;   CARDIAC CATHETERIZATION     ESOPHAGOGASTRODUODENOSCOPY (EGD) WITH PROPOFOL N/A 07/05/2020   Procedure: ESOPHAGOGASTRODUODENOSCOPY (EGD) WITH PROPOFOL;  Surgeon: Harvel Quale, MD;  Location: [facility];  Service: Gastroenterology;  Laterality: N/A;   HEMORRHOID SURGERY     removed a hemorrhoid in the 1970s   KIDNEY STONE SURGERY     remove a kidney stone    Current Outpatient Medications  Medication Sig Dispense Refill   carvedilol (COREG) 3.125 MG tablet TAKE  (1)  TABLET TWICE A DAY. 180 tablet 3   cholecalciferol (VITAMIN D) 1000 UNITS tablet Take 1,000 Units by mouth daily. 2 tablets     ELIQUIS 2.5 MG TABS tablet TAKE  (1)  TABLET TWICE A DAY. 180 tablet 3   furosemide (LASIX) 20 MG tablet Take 20mg  (1 tablet) by mouth daily alternating with 40mg  (2 tablets) daily. 120 tablet 2   pantoprazole (PROTONIX) 40 MG tablet TAKE ONE TABLET ONCE DAILY 30 MINUTES BEFORE BREAKFAST 30 tablet 0   simvastatin (ZOCOR) 40 MG tablet Take 1 tablet (40 mg total) by mouth at bedtime. 90 tablet 3   spironolactone (ALDACTONE) 25 MG tablet TAKE (1/2) TABLET DAILY. 45 tablet 3   vitamin B-12 (CYANOCOBALAMIN) 500 MCG tablet Take 1 tablet (500 mcg total) by  mouth daily.     No current facility-administered medications for this visit.    Allergies as of 12/25/2021   (No Known Allergies)    Family History  Problem Relation Age of Onset   Heart attack Father        in his 38's   Cancer Brother        1/2   Cancer Sister    Heart Problems Brother 35       pacemaker 2/2    Social History   Socioeconomic History   Marital status: Married    Spouse name: Not on file   Number of children: Not on file   Years of education: Not on file   Highest education level: Not on file  Occupational History   Not on file  Tobacco Use   Smoking status: Former    Smokeless tobacco: Never  Substance and Sexual Activity   Alcohol use: No   Drug use: No   Sexual activity: Not on file  Other Topics Concern   Not on file  Social History Narrative   He is married, has 1-son. He does not smoke, or use alcohol   Social Determinants of Radio broadcast assistant Strain: Not on file  Food Insecurity: Not on file  Transportation Needs: Not on file  Physical Activity: Not on file  Stress: Not on file  Social Connections: Not on file   Review of systems General: negative for malaise, night sweats, fever, chills, weight loss Neck: Negative for lumps, goiter, pain and significant neck swelling Resp: Negative for cough, wheezing, dyspnea at rest CV: Negative for chest pain, leg swelling, palpitations, orthopnea GI: denies melena, hematochezia, nausea, vomiting, diarrhea, odynophagia, early satiety or unintentional weight loss. +occasional dysphagia +constipation  MSK: Negative for joint pain or swelling, back pain, and muscle pain. Derm: Negative for itching or rash Psych: Denies depression, anxiety, memory loss, confusion. No homicidal or suicidal ideation.  Heme: Negative for prolonged bleeding, bruising easily, and swollen nodes. Endocrine: Negative for cold or heat intolerance, polyuria, polydipsia and goiter. Neuro: negative for tremor, gait imbalance, syncope and seizures. The remainder of the review of systems is noncontributory.  Physical Exam: BP 116/69 (BP Location: Left Arm, Patient Position: Sitting, Cuff Size: Normal)    Pulse 79    Temp (!) 97.5 F (36.4 C) (Oral)    Ht 5\' 5"  (1.651 m)    Wt 149 lb 9.6 oz (67.9 kg)    BMI 24.89 kg/m  General:   Alert and oriented. No distress noted. Pleasant and cooperative.  Head:  Normocephalic and atraumatic. Eyes:  Conjuctiva clear without scleral icterus. Mouth:  Oral mucosa pink and moist. Good dentition. No lesions. Heart: Normal rate and rhythm, s1 and s2 heart sounds present.  Lungs: Clear  lung sounds in all lobes. Respirations equal and unlabored. Abdomen:  +BS, soft, non-tender and non-distended. No rebound or guarding. No HSM or masses noted. Derm: No palmar erythema or jaundice Msk:  Symmetrical without gross deformities. Normal posture. Extremities:  Without edema. Neurologic:  Alert and  oriented x4 Psych:  Alert and cooperative. Normal mood and affect.  Invalid input(s): 6 MONTHS   ASSESSMENT: Maurie Olesen is a 88 y.o. male presenting today for routine follow up of GERD, also having some constipation.   Maintained on protonix 40mg  once daily and doing well on this without symptoms of breakthrough reflux. He does have some occasional dysphagia, though tells me that he is missing many teeth which makes chewing  hard sometimes. He does try to chew thoroughly and take small bites. Has had no episodes of food impaction. Sometimes has issues with pills going down, notably, reports smaller pills typically give him more issue than his larger pills. Symptoms are likely in part related to some age related esophageal dysmotility. He should continue to take small bites, chew thoroughly and take sips between bites. He will let me know if symptoms become worse.  He has some constipation as well, sometimes may bo 3-4 days without having a BM and has to strain when he does go. Will use warm prune juice when he gets constipated which usually seems to help. I encouraged him to make sure he is drinking plenty of water, eating a diet high in fruits, veggies and whole grains, especially prunes and kiwi. He can try adding metamucil if he does not notice improvement in dietary changes alone. Goal is to go no more than 2 days without a BM. If he continues to have issues with this, he should let me know.   No red flag symptoms. Patient denies melena, hematochezia, nausea, vomiting, diarrhea, odyonophagia, early satiety or weight loss.   All questions were answered, patient and son verbalized  understanding and is in agreement with plan as outline above.    PLAN:  Continue protonix 40mg  once daily, refill sent 2. Drink plenty of water, 6-8 eight oz glasses/day 3. Increase fruits, veggies, whole grains, especially prunes and kiwi 4. Can try otc metamucil to help with constipation, goal of no more than 2 days without a BM 5. Chewing precautions, small bites, chew thoroughly and sips between bites    Follow Up: 1 year  Seth Higginbotham L. Alver Sorrow, MSN, APRN, AGNP-C Adult-Gerontology Nurse Practitioner [facility]

## 2021-12-27 ENCOUNTER — Ambulatory Visit (INDEPENDENT_AMBULATORY_CARE_PROVIDER_SITE_OTHER): Payer: PPO

## 2021-12-27 DIAGNOSIS — I440 Atrioventricular block, first degree: Secondary | ICD-10-CM | POA: Diagnosis not present

## 2021-12-27 LAB — CUP PACEART REMOTE DEVICE CHECK
Battery Remaining Longevity: 19 mo
Battery Voltage: 2.91 V
Brady Statistic RA Percent Paced: 0.98 %
Brady Statistic RV Percent Paced: 96.75 %
Date Time Interrogation Session: 20230221210146
Implantable Lead Implant Date: 20200217
Implantable Lead Implant Date: 20200217
Implantable Lead Implant Date: 20200217
Implantable Lead Location: 753858
Implantable Lead Location: 753859
Implantable Lead Location: 753860
Implantable Lead Model: 4398
Implantable Lead Model: 5076
Implantable Lead Model: 5076
Implantable Pulse Generator Implant Date: 20200217
Lead Channel Impedance Value: 285 Ohm
Lead Channel Impedance Value: 304 Ohm
Lead Channel Impedance Value: 304 Ohm
Lead Channel Impedance Value: 361 Ohm
Lead Channel Impedance Value: 380 Ohm
Lead Channel Impedance Value: 399 Ohm
Lead Channel Impedance Value: 418 Ohm
Lead Channel Impedance Value: 475 Ohm
Lead Channel Impedance Value: 494 Ohm
Lead Channel Impedance Value: 589 Ohm
Lead Channel Impedance Value: 589 Ohm
Lead Channel Impedance Value: 608 Ohm
Lead Channel Impedance Value: 665 Ohm
Lead Channel Impedance Value: 684 Ohm
Lead Channel Pacing Threshold Amplitude: 0.5 V
Lead Channel Pacing Threshold Amplitude: 1.25 V
Lead Channel Pacing Threshold Amplitude: 1.75 V
Lead Channel Pacing Threshold Pulse Width: 0.4 ms
Lead Channel Pacing Threshold Pulse Width: 0.4 ms
Lead Channel Pacing Threshold Pulse Width: 1 ms
Lead Channel Sensing Intrinsic Amplitude: 0.5 mV
Lead Channel Sensing Intrinsic Amplitude: 0.5 mV
Lead Channel Sensing Intrinsic Amplitude: 10 mV
Lead Channel Sensing Intrinsic Amplitude: 10 mV
Lead Channel Setting Pacing Amplitude: 2 V
Lead Channel Setting Pacing Amplitude: 2.5 V
Lead Channel Setting Pacing Amplitude: 4.75 V
Lead Channel Setting Pacing Pulse Width: 0.4 ms
Lead Channel Setting Pacing Pulse Width: 1 ms
Lead Channel Setting Sensing Sensitivity: 1.2 mV

## 2022-01-03 NOTE — Progress Notes (Signed)
Remote pacemaker transmission.   

## 2022-02-02 DIAGNOSIS — E785 Hyperlipidemia, unspecified: Secondary | ICD-10-CM | POA: Diagnosis not present

## 2022-02-02 DIAGNOSIS — I2510 Atherosclerotic heart disease of native coronary artery without angina pectoris: Secondary | ICD-10-CM | POA: Diagnosis not present

## 2022-02-02 DIAGNOSIS — I4891 Unspecified atrial fibrillation: Secondary | ICD-10-CM | POA: Diagnosis not present

## 2022-02-02 DIAGNOSIS — K5900 Constipation, unspecified: Secondary | ICD-10-CM | POA: Diagnosis not present

## 2022-02-02 DIAGNOSIS — D649 Anemia, unspecified: Secondary | ICD-10-CM | POA: Diagnosis not present

## 2022-02-02 DIAGNOSIS — R7301 Impaired fasting glucose: Secondary | ICD-10-CM | POA: Diagnosis not present

## 2022-02-02 DIAGNOSIS — I700 Atherosclerosis of aorta: Secondary | ICD-10-CM | POA: Diagnosis not present

## 2022-02-02 DIAGNOSIS — R269 Unspecified abnormalities of gait and mobility: Secondary | ICD-10-CM | POA: Diagnosis not present

## 2022-02-02 DIAGNOSIS — N1831 Chronic kidney disease, stage 3a: Secondary | ICD-10-CM | POA: Diagnosis not present

## 2022-02-02 DIAGNOSIS — I129 Hypertensive chronic kidney disease with stage 1 through stage 4 chronic kidney disease, or unspecified chronic kidney disease: Secondary | ICD-10-CM | POA: Diagnosis not present

## 2022-02-02 DIAGNOSIS — R627 Adult failure to thrive: Secondary | ICD-10-CM | POA: Diagnosis not present

## 2022-02-02 DIAGNOSIS — I255 Ischemic cardiomyopathy: Secondary | ICD-10-CM | POA: Diagnosis not present

## 2022-02-06 DIAGNOSIS — B351 Tinea unguium: Secondary | ICD-10-CM | POA: Diagnosis not present

## 2022-02-06 DIAGNOSIS — I70203 Unspecified atherosclerosis of native arteries of extremities, bilateral legs: Secondary | ICD-10-CM | POA: Diagnosis not present

## 2022-02-06 DIAGNOSIS — L84 Corns and callosities: Secondary | ICD-10-CM | POA: Diagnosis not present

## 2022-02-06 DIAGNOSIS — M79676 Pain in unspecified toe(s): Secondary | ICD-10-CM | POA: Diagnosis not present

## 2022-03-28 ENCOUNTER — Ambulatory Visit (INDEPENDENT_AMBULATORY_CARE_PROVIDER_SITE_OTHER): Payer: PPO

## 2022-03-28 ENCOUNTER — Ambulatory Visit (HOSPITAL_COMMUNITY)
Admission: RE | Admit: 2022-03-28 | Discharge: 2022-03-28 | Disposition: A | Discharge status: Home / Self Care | Payer: PPO | Source: Ambulatory Visit | Attending: Cardiology | Admitting: Cardiology

## 2022-03-28 ENCOUNTER — Encounter (HOSPITAL_COMMUNITY): Payer: Self-pay | Admitting: Cardiology

## 2022-03-28 VITALS — BP 90/60 | HR 85 | Wt 154.0 lb

## 2022-03-28 DIAGNOSIS — I440 Atrioventricular block, first degree: Secondary | ICD-10-CM | POA: Diagnosis not present

## 2022-03-28 DIAGNOSIS — I5022 Chronic systolic (congestive) heart failure: Secondary | ICD-10-CM | POA: Insufficient documentation

## 2022-03-28 DIAGNOSIS — Z950 Presence of cardiac pacemaker: Secondary | ICD-10-CM

## 2022-03-28 LAB — CBC
HCT: 36.8 % — ABNORMAL LOW (ref 39.0–52.0)
Hemoglobin: 12.3 g/dL — ABNORMAL LOW (ref 13.0–17.0)
MCH: 31.7 pg (ref 26.0–34.0)
MCHC: 33.4 g/dL (ref 30.0–36.0)
MCV: 94.8 fL (ref 80.0–100.0)
Platelets: 188 10*3/uL (ref 150–400)
RBC: 3.88 MIL/uL — ABNORMAL LOW (ref 4.22–5.81)
RDW: 13.9 % (ref 11.5–15.5)
WBC: 5.8 10*3/uL (ref 4.0–10.5)
nRBC: 0 % (ref 0.0–0.2)

## 2022-03-28 LAB — BASIC METABOLIC PANEL
Anion gap: 5 (ref 5–15)
BUN: 19 mg/dL (ref 8–23)
CO2: 30 mmol/L (ref 22–32)
Calcium: 9.5 mg/dL (ref 8.9–10.3)
Chloride: 104 mmol/L (ref 98–111)
Creatinine, Ser: 1.59 mg/dL — ABNORMAL HIGH (ref 0.61–1.24)
GFR, Estimated: 39 mL/min — ABNORMAL LOW (ref >60)
Glucose, Bld: 109 mg/dL — ABNORMAL HIGH (ref 70–99)
Potassium: 3.7 mmol/L (ref 3.5–5.1)
Sodium: 139 mmol/L (ref 135–145)

## 2022-03-28 LAB — CUP PACEART REMOTE DEVICE CHECK
Battery Remaining Longevity: 19 mo
Battery Voltage: 2.89 V
Brady Statistic RA Percent Paced: 1.02 %
Brady Statistic RV Percent Paced: 95.93 %
Date Time Interrogation Session: 20230524022224
Implantable Lead Implant Date: 20200217
Implantable Lead Implant Date: 20200217
Implantable Lead Implant Date: 20200217
Implantable Lead Location: 753858
Implantable Lead Location: 753859
Implantable Lead Location: 753860
Implantable Lead Model: 4398
Implantable Lead Model: 5076
Implantable Lead Model: 5076
Implantable Pulse Generator Implant Date: 20200217
Lead Channel Impedance Value: 285 Ohm
Lead Channel Impedance Value: 304 Ohm
Lead Channel Impedance Value: 342 Ohm
Lead Channel Impedance Value: 361 Ohm
Lead Channel Impedance Value: 361 Ohm
Lead Channel Impedance Value: 380 Ohm
Lead Channel Impedance Value: 380 Ohm
Lead Channel Impedance Value: 456 Ohm
Lead Channel Impedance Value: 494 Ohm
Lead Channel Impedance Value: 608 Ohm
Lead Channel Impedance Value: 608 Ohm
Lead Channel Impedance Value: 608 Ohm
Lead Channel Impedance Value: 646 Ohm
Lead Channel Impedance Value: 665 Ohm
Lead Channel Pacing Threshold Amplitude: 0.5 V
Lead Channel Pacing Threshold Amplitude: 1.375 V
Lead Channel Pacing Threshold Amplitude: 2 V
Lead Channel Pacing Threshold Pulse Width: 0.4 ms
Lead Channel Pacing Threshold Pulse Width: 0.4 ms
Lead Channel Pacing Threshold Pulse Width: 1 ms
Lead Channel Sensing Intrinsic Amplitude: 0.5 mV
Lead Channel Sensing Intrinsic Amplitude: 0.5 mV
Lead Channel Sensing Intrinsic Amplitude: 10.125 mV
Lead Channel Sensing Intrinsic Amplitude: 10.125 mV
Lead Channel Setting Pacing Amplitude: 2 V
Lead Channel Setting Pacing Amplitude: 2.5 V
Lead Channel Setting Pacing Amplitude: 4.25 V
Lead Channel Setting Pacing Pulse Width: 0.4 ms
Lead Channel Setting Pacing Pulse Width: 1 ms
Lead Channel Setting Sensing Sensitivity: 1.2 mV

## 2022-03-28 MED ORDER — SPIRONOLACTONE 25 MG PO TABS: 12.5000 mg | ORAL_TABLET | Freq: Every day | ORAL | 3 refills | Status: DC

## 2022-03-28 NOTE — Patient Instructions (Signed)
Medication Changes:  Take your Spironolactone to every night  Lab Work:  Labs done today, your results will be available in MyChart, we will contact you for abnormal readings.   Testing/Procedures:  none  Referrals:  none  Special Instructions // Education:  PLEASE WEAR YOUR COMPRESSION STOCKINGS  Follow-Up in: 6 months (November 2023) **PLEASE CALL THE OFFICE IN September TO ARRANGE YOUR FOLLOW UP APPOINTMENT **  At the [facility], you and your health needs are our priority. We have a designated team specialized in the treatment of Heart Failure. This Care Team includes your primary Heart Failure Specialized Cardiologist (physician), Advanced Practice Providers (APPs- Physician Assistants and Nurse Practitioners), and Pharmacist who all work together to provide you with the care you need, when you need it.   You may see any of the following providers on your designated Care Team at your next follow up:  Dr Glori Bickers Dr Haynes Kerns, NP Lyda Jester, [place] [facility] [place], [place] Audry Riles, PharmD   Please be sure to bring in all your medications bottles to every appointment.   Need to Contact us:  If you have any questions or concerns before your next appointment please send [place] a message through [place] or call our office at [phone].    TO LEAVE A MESSAGE FOR THE NURSE SELECT OPTION 2, PLEASE LEAVE A MESSAGE INCLUDING: YOUR NAME DATE OF BIRTH CALL BACK NUMBER REASON FOR CALL**this is important as we prioritize the call backs  YOU WILL RECEIVE A CALL BACK THE SAME DAY AS LONG AS YOU CALL BEFORE 4:00 PM

## 2022-03-28 NOTE — Progress Notes (Signed)
Medication Samples have been provided to the patient.  Drug name: EILQUIS       Strength: 2.'5MG'$         Qty: 4 BOXES  LOT: [id number]  Exp.Date: 09/23  Dosing instructions: TAKE 1 TABLET Twice daily   The patient has been instructed regarding the correct time, dose, and frequency of taking this medication, including desired effects and most common side effects.   Michael Walrath M Laria Grimmett 12:54 PM 03/28/2022

## 2022-03-29 NOTE — Progress Notes (Signed)
Patient ID: Mark Lane, male   DOB: [birth year], 88 y.o.   MRN: [id number] PCP: Dr. Forde Dandy Cardiology: Dr. Aundra Dubin  88 y.o.with history of CAD and ischemic cardiomyopathy presents for followup of CAD and CHF.  He had an anterolateral STEMI in 7/11 with ostial occlusion of a large 2nd diagonal that was treated with BMS.  EF immediately post-MI was 35%.  He has also been noted to have Mobitz Type I 2nd degree AV block at times.  Last echo in 7/14 showed EF 30-35% with septal/apical akinesis and anterior hypokinesis.    In 2/20, he developed complete heart block and had implantation of a Medtronic CRT-P device.  He subsequently developed atrial fibrillation which has become chronic.   Echo was done in 9/20, showing EF 45-50%, mild LVH, normal RV.   Echo in 8/21 showed EF 50%, normal RV.   He is stable symptomatically.  Has trouble with his memory.  Weight stable.  Has some swelling in his ankles.  No falls though legs generally feel weak.  No dyspnea walking around the house, dyspnea with longer distances.  BP runs low but he denies lightheadedness.     Medtronic device interrogation: atrial fibrillation chronically, fluid index < threshold, 96% BiV pacing.   Labs (6/13): LDL 68, HDL 40 Labs (2/14): K 3.9, creatinine 1.2 Labs (8/14): K 4.1, creatinine 1.4 Labs (12/14): creatinine 1.3 Labs (4/15): K 4.9, creatinine 1.92, BNP 494.6 Labs (9/15): K 3.9, creatinine 1.4, LDL 133, HDL 43 Labs (3/16): K 3.9, creatinine 1.22, LDL 76 Labs (10/16): K 3.8, creatinine 1.15 Labs (3/18): K 3.4, creatinine 1.22 Labs (7/18): LDL 43, HDL 43, LFTs normal Labs (10/18): K 4.1, creatinine 1.17, BNP 298 Labs (6/20): K 4.4, creatinine 1.27, hgb 11.7 Labs (9/20): K 4, creatinine 1.34, LDL 60 Labs (12/20): K 4.3, creatinine 1.64 Labs (8/21): hgb 12.8, K 3.7, creatinine 1.64, LFTs normal, TSH normal Labs (11/21): K 3.9, creatinine 1.47 Labs (4/22): LDL 74, HDL 51, K 4.5, creatinine 1.52 Labs (8/22): hgb 12.6, K 4.3,  creatinine 1.54, BNP 354 Labs (12/22): K 4.4, creatinine 1.47, BNP 454  PMH: 1. Hyperlipidemia 2. Nephrolithiasis 3. CAD: Anterolateral STEMI in 7/11.  LHC with ostial total occlusion of a large D2, dominant LCx, small RCA.  Patient had BMS to D2.  4. Complete heart block: Has Medtronic CRT-P device.  5. Ischemic cardiomyopathy: Echo (7/11) with EF 35% and peri-apical akinesis, grade II diastolic dysfunction, normal RV size and systolic function.  Echo (7/14) with EF 30-35%, septal/apical akinesis, anterior hypokinesis, mild MR/AI.  - Echo (9/20): EF 45-50%, mild LVH, normal RV.  - Echo (8/21): EF 50%, normal RV 6. CKD 7. Atrial fibrillation: Chronic.  8. Chronic gastritis  SH: Married, lives in [place], prior tobacco smoker.  Has a farm, grows peaches. World War 2 veteran.   FH: CAD  ROS: All systems reviewed and negative except as per HPI  Current Outpatient Medications  Medication Sig Dispense Refill   carvedilol (COREG) 3.125 MG tablet TAKE  (1)  TABLET TWICE A DAY. 180 tablet 3   cholecalciferol (VITAMIN D) 1000 UNITS tablet Take 1,000 Units by mouth daily. 2 tablets     ELIQUIS 2.5 MG TABS tablet TAKE  (1)  TABLET TWICE A DAY. 180 tablet 3   furosemide (LASIX) 20 MG tablet Take '20mg'$  (1 tablet) by mouth daily alternating with '40mg'$  (2 tablets) daily. 120 tablet 2   pantoprazole (PROTONIX) 40 MG tablet Take 1 tablet (40 mg total) by mouth daily. [place]  tablet 3   simvastatin (ZOCOR) 40 MG tablet Take 1 tablet (40 mg total) by mouth at bedtime. 90 tablet 3   vitamin B-12 (CYANOCOBALAMIN) 500 MCG tablet Take 1 tablet (500 mcg total) by mouth daily.     spironolactone (ALDACTONE) 25 MG tablet Take 0.5 tablets (12.5 mg total) by mouth at bedtime. 45 tablet 3   No current facility-administered medications for this encounter.   BP 90/60   Pulse 85   Wt 69.9 kg (154 lb)   SpO2 97%   BMI 25.63 kg/m  General: NAD Neck: No JVD, no thyromegaly or thyroid nodule.  Lungs: Clear to  auscultation bilaterally with normal respiratory effort. CV: Nondisplaced PMI.  Heart regular S1/S2, no S3/S4, no murmur.  1+ ankle edema.  No carotid bruit.  Normal pedal pulses.  Abdomen: Soft, nontender, no hepatosplenomegaly, no distention.  Skin: Intact without lesions or rashes.  Neurologic: Alert and oriented x 3.  Psych: Normal affect. Extremities: No clubbing or cyanosis.  HEENT: Normal.   Assessment/Plan: 1. CAD: No chest pain.    - No ASA given stable CAD and Eliquis use.  - Continue statin.  2. Chronic systolic CHF: Ischemic cardiomyopathy.  Now with Medtronic CRT-P device.  Echo in 8/21 with EF up to 50% with normal RV.  NYHA class 2-3 symptoms.  He has peripheral edema but no JVD, suspect venous insufficiency.    - Continue Lasix 40 daily alternating with 20 daily.  BMET today.  - I asked him to wear graded compression stockings.  - Continue spironolactone 12.5 daily.  - Continue Coreg 3.125 mg bid.  - Off lisinopril with soft BP.   3. Hyperlipidemia:  Continue Zocor.  4. Complete heart block: S/p MDT CRT-P device placement.  5. Atrial fibrillation: Chronic.   - Continue apixaban 2.5 mg bid (age, creatinine > 1.5).    Followup in 6 months.   [name] 03/29/2022

## 2022-04-09 ENCOUNTER — Other Ambulatory Visit: Payer: Self-pay

## 2022-04-10 NOTE — Progress Notes (Signed)
Remote pacemaker transmission.   

## 2022-05-04 DIAGNOSIS — I4891 Unspecified atrial fibrillation: Secondary | ICD-10-CM | POA: Diagnosis not present

## 2022-05-04 DIAGNOSIS — E785 Hyperlipidemia, unspecified: Secondary | ICD-10-CM | POA: Diagnosis not present

## 2022-05-04 DIAGNOSIS — I255 Ischemic cardiomyopathy: Secondary | ICD-10-CM | POA: Diagnosis not present

## 2022-05-04 DIAGNOSIS — I129 Hypertensive chronic kidney disease with stage 1 through stage 4 chronic kidney disease, or unspecified chronic kidney disease: Secondary | ICD-10-CM | POA: Diagnosis not present

## 2022-06-27 ENCOUNTER — Ambulatory Visit (INDEPENDENT_AMBULATORY_CARE_PROVIDER_SITE_OTHER): Payer: PPO

## 2022-06-27 DIAGNOSIS — I442 Atrioventricular block, complete: Secondary | ICD-10-CM | POA: Diagnosis not present

## 2022-06-28 LAB — CUP PACEART REMOTE DEVICE CHECK
Battery Remaining Longevity: 18 mo
Battery Voltage: 2.89 V
Brady Statistic RA Percent Paced: 1.16 %
Brady Statistic RV Percent Paced: 97.57 %
Date Time Interrogation Session: 20230822211126
Implantable Lead Implant Date: 20200217
Implantable Lead Implant Date: 20200217
Implantable Lead Implant Date: 20200217
Implantable Lead Location: 753858
Implantable Lead Location: 753859
Implantable Lead Location: 753860
Implantable Lead Model: 4398
Implantable Lead Model: 5076
Implantable Lead Model: 5076
Implantable Pulse Generator Implant Date: 20200217
Lead Channel Impedance Value: 304 Ohm
Lead Channel Impedance Value: 304 Ohm
Lead Channel Impedance Value: 304 Ohm
Lead Channel Impedance Value: 361 Ohm
Lead Channel Impedance Value: 380 Ohm
Lead Channel Impedance Value: 399 Ohm
Lead Channel Impedance Value: 399 Ohm
Lead Channel Impedance Value: 475 Ohm
Lead Channel Impedance Value: 494 Ohm
Lead Channel Impedance Value: 589 Ohm
Lead Channel Impedance Value: 608 Ohm
Lead Channel Impedance Value: 608 Ohm
Lead Channel Impedance Value: 665 Ohm
Lead Channel Impedance Value: 684 Ohm
Lead Channel Pacing Threshold Amplitude: 0.5 V
Lead Channel Pacing Threshold Amplitude: 1.375 V
Lead Channel Pacing Threshold Amplitude: 2.125 V
Lead Channel Pacing Threshold Pulse Width: 0.4 ms
Lead Channel Pacing Threshold Pulse Width: 0.4 ms
Lead Channel Pacing Threshold Pulse Width: 1 ms
Lead Channel Sensing Intrinsic Amplitude: 0.375 mV
Lead Channel Sensing Intrinsic Amplitude: 0.375 mV
Lead Channel Sensing Intrinsic Amplitude: 13 mV
Lead Channel Sensing Intrinsic Amplitude: 13 mV
Lead Channel Setting Pacing Amplitude: 2 V
Lead Channel Setting Pacing Amplitude: 2.5 V
Lead Channel Setting Pacing Amplitude: 4.25 V
Lead Channel Setting Pacing Pulse Width: 0.4 ms
Lead Channel Setting Pacing Pulse Width: 1 ms
Lead Channel Setting Sensing Sensitivity: 1.2 mV

## 2022-07-19 ENCOUNTER — Other Ambulatory Visit (HOSPITAL_COMMUNITY): Payer: Self-pay | Admitting: Cardiology

## 2022-07-24 NOTE — Progress Notes (Signed)
Remote pacemaker transmission.   

## 2022-07-25 DIAGNOSIS — Z950 Presence of cardiac pacemaker: Secondary | ICD-10-CM | POA: Diagnosis not present

## 2022-07-25 DIAGNOSIS — I4891 Unspecified atrial fibrillation: Secondary | ICD-10-CM | POA: Diagnosis not present

## 2022-07-25 DIAGNOSIS — Z7901 Long term (current) use of anticoagulants: Secondary | ICD-10-CM | POA: Diagnosis not present

## 2022-07-25 DIAGNOSIS — I951 Orthostatic hypotension: Secondary | ICD-10-CM | POA: Diagnosis not present

## 2022-07-25 DIAGNOSIS — I442 Atrioventricular block, complete: Secondary | ICD-10-CM | POA: Diagnosis not present

## 2022-07-25 DIAGNOSIS — I509 Heart failure, unspecified: Secondary | ICD-10-CM | POA: Diagnosis not present

## 2022-08-16 ENCOUNTER — Other Ambulatory Visit: Payer: Self-pay | Admitting: Internal Medicine

## 2022-09-03 DIAGNOSIS — N1832 Chronic kidney disease, stage 3b: Secondary | ICD-10-CM | POA: Diagnosis not present

## 2022-09-04 ENCOUNTER — Other Ambulatory Visit: Payer: Self-pay | Admitting: Internal Medicine

## 2022-09-26 ENCOUNTER — Ambulatory Visit (INDEPENDENT_AMBULATORY_CARE_PROVIDER_SITE_OTHER): Payer: PPO

## 2022-09-26 DIAGNOSIS — I442 Atrioventricular block, complete: Secondary | ICD-10-CM | POA: Diagnosis not present

## 2022-09-26 LAB — CUP PACEART REMOTE DEVICE CHECK
Battery Remaining Longevity: 20 mo
Battery Voltage: 2.87 V
Brady Statistic RA Percent Paced: 1.13 %
Brady Statistic RV Percent Paced: 98.7 %
Date Time Interrogation Session: 20231121205935
Implantable Lead Connection Status: 753985
Implantable Lead Connection Status: 753985
Implantable Lead Connection Status: 753985
Implantable Lead Implant Date: 20200217
Implantable Lead Implant Date: 20200217
Implantable Lead Implant Date: 20200217
Implantable Lead Location: 753858
Implantable Lead Location: 753859
Implantable Lead Location: 753860
Implantable Lead Model: 4398
Implantable Lead Model: 5076
Implantable Lead Model: 5076
Implantable Pulse Generator Implant Date: 20200217
Lead Channel Impedance Value: 285 Ohm
Lead Channel Impedance Value: 285 Ohm
Lead Channel Impedance Value: 361 Ohm
Lead Channel Impedance Value: 380 Ohm
Lead Channel Impedance Value: 380 Ohm
Lead Channel Impedance Value: 380 Ohm
Lead Channel Impedance Value: 399 Ohm
Lead Channel Impedance Value: 437 Ohm
Lead Channel Impedance Value: 494 Ohm
Lead Channel Impedance Value: 665 Ohm
Lead Channel Impedance Value: 665 Ohm
Lead Channel Impedance Value: 684 Ohm
Lead Channel Impedance Value: 684 Ohm
Lead Channel Impedance Value: 684 Ohm
Lead Channel Pacing Threshold Amplitude: 0.5 V
Lead Channel Pacing Threshold Amplitude: 1.375 V
Lead Channel Pacing Threshold Amplitude: 1.375 V
Lead Channel Pacing Threshold Pulse Width: 0.4 ms
Lead Channel Pacing Threshold Pulse Width: 0.4 ms
Lead Channel Pacing Threshold Pulse Width: 1 ms
Lead Channel Sensing Intrinsic Amplitude: 0.5 mV
Lead Channel Sensing Intrinsic Amplitude: 0.5 mV
Lead Channel Sensing Intrinsic Amplitude: 16.125 mV
Lead Channel Sensing Intrinsic Amplitude: 16.125 mV
Lead Channel Setting Pacing Amplitude: 2 V
Lead Channel Setting Pacing Amplitude: 2.5 V
Lead Channel Setting Pacing Amplitude: 3.25 V
Lead Channel Setting Pacing Pulse Width: 0.4 ms
Lead Channel Setting Pacing Pulse Width: 1 ms
Lead Channel Setting Sensing Sensitivity: 1.2 mV
Zone Setting Status: 755011
Zone Setting Status: 755011

## 2022-10-02 ENCOUNTER — Other Ambulatory Visit (HOSPITAL_COMMUNITY): Payer: Self-pay | Admitting: Cardiology

## 2022-10-10 ENCOUNTER — Other Ambulatory Visit: Payer: Self-pay | Admitting: Internal Medicine

## 2022-10-17 DIAGNOSIS — N1831 Chronic kidney disease, stage 3a: Secondary | ICD-10-CM | POA: Diagnosis not present

## 2022-10-17 DIAGNOSIS — D649 Anemia, unspecified: Secondary | ICD-10-CM | POA: Diagnosis not present

## 2022-10-17 DIAGNOSIS — I129 Hypertensive chronic kidney disease with stage 1 through stage 4 chronic kidney disease, or unspecified chronic kidney disease: Secondary | ICD-10-CM | POA: Diagnosis not present

## 2022-10-17 DIAGNOSIS — R7301 Impaired fasting glucose: Secondary | ICD-10-CM | POA: Diagnosis not present

## 2022-10-17 DIAGNOSIS — I4891 Unspecified atrial fibrillation: Secondary | ICD-10-CM | POA: Diagnosis not present

## 2022-10-17 DIAGNOSIS — Z950 Presence of cardiac pacemaker: Secondary | ICD-10-CM | POA: Diagnosis not present

## 2022-10-17 DIAGNOSIS — E785 Hyperlipidemia, unspecified: Secondary | ICD-10-CM | POA: Diagnosis not present

## 2022-10-17 DIAGNOSIS — R269 Unspecified abnormalities of gait and mobility: Secondary | ICD-10-CM | POA: Diagnosis not present

## 2022-10-17 DIAGNOSIS — I2510 Atherosclerotic heart disease of native coronary artery without angina pectoris: Secondary | ICD-10-CM | POA: Diagnosis not present

## 2022-10-17 DIAGNOSIS — I700 Atherosclerosis of aorta: Secondary | ICD-10-CM | POA: Diagnosis not present

## 2022-10-17 DIAGNOSIS — I255 Ischemic cardiomyopathy: Secondary | ICD-10-CM | POA: Diagnosis not present

## 2022-10-18 NOTE — Progress Notes (Signed)
Remote pacemaker transmission.   

## 2022-10-31 DIAGNOSIS — R001 Bradycardia, unspecified: Secondary | ICD-10-CM | POA: Diagnosis not present

## 2022-10-31 DIAGNOSIS — R0989 Other specified symptoms and signs involving the circulatory and respiratory systems: Secondary | ICD-10-CM | POA: Diagnosis not present

## 2022-10-31 DIAGNOSIS — I959 Hypotension, unspecified: Secondary | ICD-10-CM | POA: Diagnosis not present

## 2022-10-31 DIAGNOSIS — R42 Dizziness and giddiness: Secondary | ICD-10-CM | POA: Diagnosis not present

## 2022-11-01 DIAGNOSIS — I951 Orthostatic hypotension: Secondary | ICD-10-CM | POA: Diagnosis not present

## 2022-11-01 DIAGNOSIS — Z792 Long term (current) use of antibiotics: Secondary | ICD-10-CM | POA: Diagnosis not present

## 2022-11-01 DIAGNOSIS — R262 Difficulty in walking, not elsewhere classified: Secondary | ICD-10-CM | POA: Diagnosis not present

## 2022-11-01 DIAGNOSIS — R42 Dizziness and giddiness: Secondary | ICD-10-CM | POA: Diagnosis not present

## 2022-11-01 DIAGNOSIS — N3001 Acute cystitis with hematuria: Secondary | ICD-10-CM | POA: Diagnosis not present

## 2022-11-01 DIAGNOSIS — N183 Chronic kidney disease, stage 3 unspecified: Secondary | ICD-10-CM | POA: Diagnosis not present

## 2022-11-01 DIAGNOSIS — N179 Acute kidney failure, unspecified: Secondary | ICD-10-CM | POA: Diagnosis not present

## 2022-11-01 DIAGNOSIS — R531 Weakness: Secondary | ICD-10-CM | POA: Diagnosis not present

## 2022-11-01 DIAGNOSIS — K219 Gastro-esophageal reflux disease without esophagitis: Secondary | ICD-10-CM | POA: Diagnosis not present

## 2022-11-01 DIAGNOSIS — R41841 Cognitive communication deficit: Secondary | ICD-10-CM | POA: Diagnosis not present

## 2022-11-01 DIAGNOSIS — Z7901 Long term (current) use of anticoagulants: Secondary | ICD-10-CM | POA: Diagnosis not present

## 2022-11-01 DIAGNOSIS — E785 Hyperlipidemia, unspecified: Secondary | ICD-10-CM | POA: Diagnosis not present

## 2022-11-01 DIAGNOSIS — R2689 Other abnormalities of gait and mobility: Secondary | ICD-10-CM | POA: Diagnosis not present

## 2022-11-01 DIAGNOSIS — R001 Bradycardia, unspecified: Secondary | ICD-10-CM | POA: Diagnosis not present

## 2022-11-01 DIAGNOSIS — N390 Urinary tract infection, site not specified: Secondary | ICD-10-CM | POA: Diagnosis not present

## 2022-11-01 DIAGNOSIS — K449 Diaphragmatic hernia without obstruction or gangrene: Secondary | ICD-10-CM | POA: Diagnosis not present

## 2022-11-01 DIAGNOSIS — B9620 Unspecified Escherichia coli [E. coli] as the cause of diseases classified elsewhere: Secondary | ICD-10-CM | POA: Diagnosis not present

## 2022-11-01 DIAGNOSIS — R82998 Other abnormal findings in urine: Secondary | ICD-10-CM | POA: Diagnosis not present

## 2022-11-01 DIAGNOSIS — Z950 Presence of cardiac pacemaker: Secondary | ICD-10-CM | POA: Diagnosis not present

## 2022-11-01 DIAGNOSIS — Z79899 Other long term (current) drug therapy: Secondary | ICD-10-CM | POA: Diagnosis not present

## 2022-11-01 DIAGNOSIS — M6281 Muscle weakness (generalized): Secondary | ICD-10-CM | POA: Diagnosis not present

## 2022-11-01 DIAGNOSIS — Z87891 Personal history of nicotine dependence: Secondary | ICD-10-CM | POA: Diagnosis not present

## 2022-11-01 DIAGNOSIS — E7800 Pure hypercholesterolemia, unspecified: Secondary | ICD-10-CM | POA: Diagnosis not present

## 2022-11-01 DIAGNOSIS — I4819 Other persistent atrial fibrillation: Secondary | ICD-10-CM | POA: Diagnosis not present

## 2022-11-01 DIAGNOSIS — E860 Dehydration: Secondary | ICD-10-CM | POA: Diagnosis not present

## 2022-11-01 DIAGNOSIS — I6381 Other cerebral infarction due to occlusion or stenosis of small artery: Secondary | ICD-10-CM | POA: Diagnosis not present

## 2022-11-01 DIAGNOSIS — D649 Anemia, unspecified: Secondary | ICD-10-CM | POA: Diagnosis not present

## 2022-11-01 DIAGNOSIS — Z20822 Contact with and (suspected) exposure to covid-19: Secondary | ICD-10-CM | POA: Diagnosis not present

## 2022-11-01 DIAGNOSIS — I429 Cardiomyopathy, unspecified: Secondary | ICD-10-CM | POA: Diagnosis not present

## 2022-11-01 DIAGNOSIS — D631 Anemia in chronic kidney disease: Secondary | ICD-10-CM | POA: Diagnosis not present

## 2022-11-06 ENCOUNTER — Encounter (HOSPITAL_COMMUNITY): Payer: PPO | Admitting: Cardiology

## 2022-11-09 DIAGNOSIS — R531 Weakness: Secondary | ICD-10-CM | POA: Diagnosis not present

## 2022-11-09 DIAGNOSIS — I4819 Other persistent atrial fibrillation: Secondary | ICD-10-CM | POA: Diagnosis not present

## 2022-11-09 DIAGNOSIS — N3001 Acute cystitis with hematuria: Secondary | ICD-10-CM | POA: Diagnosis not present

## 2022-11-09 DIAGNOSIS — E785 Hyperlipidemia, unspecified: Secondary | ICD-10-CM | POA: Diagnosis not present

## 2022-11-09 DIAGNOSIS — R001 Bradycardia, unspecified: Secondary | ICD-10-CM | POA: Diagnosis not present

## 2022-11-09 DIAGNOSIS — E860 Dehydration: Secondary | ICD-10-CM | POA: Diagnosis not present

## 2022-11-09 DIAGNOSIS — Z950 Presence of cardiac pacemaker: Secondary | ICD-10-CM | POA: Diagnosis not present

## 2022-11-09 DIAGNOSIS — R41841 Cognitive communication deficit: Secondary | ICD-10-CM | POA: Diagnosis not present

## 2022-11-09 DIAGNOSIS — R42 Dizziness and giddiness: Secondary | ICD-10-CM | POA: Diagnosis not present

## 2022-11-09 DIAGNOSIS — I10 Essential (primary) hypertension: Secondary | ICD-10-CM | POA: Diagnosis not present

## 2022-11-09 DIAGNOSIS — N179 Acute kidney failure, unspecified: Secondary | ICD-10-CM | POA: Diagnosis not present

## 2022-11-09 DIAGNOSIS — B9620 Unspecified Escherichia coli [E. coli] as the cause of diseases classified elsewhere: Secondary | ICD-10-CM | POA: Diagnosis not present

## 2022-11-09 DIAGNOSIS — I429 Cardiomyopathy, unspecified: Secondary | ICD-10-CM | POA: Diagnosis not present

## 2022-11-09 DIAGNOSIS — I5023 Acute on chronic systolic (congestive) heart failure: Secondary | ICD-10-CM | POA: Diagnosis not present

## 2022-11-09 DIAGNOSIS — R2689 Other abnormalities of gait and mobility: Secondary | ICD-10-CM | POA: Diagnosis not present

## 2022-11-09 DIAGNOSIS — Z7901 Long term (current) use of anticoagulants: Secondary | ICD-10-CM | POA: Diagnosis not present

## 2022-11-09 DIAGNOSIS — N183 Chronic kidney disease, stage 3 unspecified: Secondary | ICD-10-CM | POA: Diagnosis not present

## 2022-11-09 DIAGNOSIS — M6281 Muscle weakness (generalized): Secondary | ICD-10-CM | POA: Diagnosis not present

## 2022-11-09 DIAGNOSIS — K219 Gastro-esophageal reflux disease without esophagitis: Secondary | ICD-10-CM | POA: Diagnosis not present

## 2022-11-09 DIAGNOSIS — E038 Other specified hypothyroidism: Secondary | ICD-10-CM | POA: Diagnosis not present

## 2022-11-11 DIAGNOSIS — I5023 Acute on chronic systolic (congestive) heart failure: Secondary | ICD-10-CM | POA: Diagnosis not present

## 2022-11-11 DIAGNOSIS — E038 Other specified hypothyroidism: Secondary | ICD-10-CM | POA: Diagnosis not present

## 2022-11-11 DIAGNOSIS — R531 Weakness: Secondary | ICD-10-CM | POA: Diagnosis not present

## 2022-11-11 DIAGNOSIS — I10 Essential (primary) hypertension: Secondary | ICD-10-CM | POA: Diagnosis not present

## 2022-11-26 DIAGNOSIS — N183 Chronic kidney disease, stage 3 unspecified: Secondary | ICD-10-CM | POA: Diagnosis not present

## 2022-11-26 DIAGNOSIS — Z7901 Long term (current) use of anticoagulants: Secondary | ICD-10-CM | POA: Diagnosis not present

## 2022-11-26 DIAGNOSIS — R42 Dizziness and giddiness: Secondary | ICD-10-CM | POA: Diagnosis not present

## 2022-11-26 DIAGNOSIS — E785 Hyperlipidemia, unspecified: Secondary | ICD-10-CM | POA: Diagnosis not present

## 2022-11-26 DIAGNOSIS — N3001 Acute cystitis with hematuria: Secondary | ICD-10-CM | POA: Diagnosis not present

## 2022-11-26 DIAGNOSIS — R001 Bradycardia, unspecified: Secondary | ICD-10-CM | POA: Diagnosis not present

## 2022-11-26 DIAGNOSIS — I4819 Other persistent atrial fibrillation: Secondary | ICD-10-CM | POA: Diagnosis not present

## 2022-11-26 DIAGNOSIS — M6281 Muscle weakness (generalized): Secondary | ICD-10-CM | POA: Diagnosis not present

## 2022-11-26 DIAGNOSIS — R2689 Other abnormalities of gait and mobility: Secondary | ICD-10-CM | POA: Diagnosis not present

## 2022-11-26 DIAGNOSIS — I429 Cardiomyopathy, unspecified: Secondary | ICD-10-CM | POA: Diagnosis not present

## 2022-12-06 DIAGNOSIS — N183 Chronic kidney disease, stage 3 unspecified: Secondary | ICD-10-CM | POA: Diagnosis not present

## 2022-12-06 DIAGNOSIS — N3001 Acute cystitis with hematuria: Secondary | ICD-10-CM | POA: Diagnosis not present

## 2022-12-06 DIAGNOSIS — M6281 Muscle weakness (generalized): Secondary | ICD-10-CM | POA: Diagnosis not present

## 2022-12-06 DIAGNOSIS — E785 Hyperlipidemia, unspecified: Secondary | ICD-10-CM | POA: Diagnosis not present

## 2022-12-06 DIAGNOSIS — I4819 Other persistent atrial fibrillation: Secondary | ICD-10-CM | POA: Diagnosis not present

## 2022-12-06 DIAGNOSIS — R001 Bradycardia, unspecified: Secondary | ICD-10-CM | POA: Diagnosis not present

## 2022-12-06 DIAGNOSIS — Z7901 Long term (current) use of anticoagulants: Secondary | ICD-10-CM | POA: Diagnosis not present

## 2022-12-06 DIAGNOSIS — R42 Dizziness and giddiness: Secondary | ICD-10-CM | POA: Diagnosis not present

## 2022-12-06 DIAGNOSIS — R2689 Other abnormalities of gait and mobility: Secondary | ICD-10-CM | POA: Diagnosis not present

## 2022-12-06 DIAGNOSIS — I429 Cardiomyopathy, unspecified: Secondary | ICD-10-CM | POA: Diagnosis not present

## 2022-12-07 ENCOUNTER — Other Ambulatory Visit: Payer: Self-pay | Admitting: Internal Medicine

## 2022-12-07 DIAGNOSIS — N1831 Chronic kidney disease, stage 3a: Secondary | ICD-10-CM | POA: Diagnosis not present

## 2022-12-07 DIAGNOSIS — N390 Urinary tract infection, site not specified: Secondary | ICD-10-CM | POA: Diagnosis not present

## 2022-12-07 DIAGNOSIS — Z950 Presence of cardiac pacemaker: Secondary | ICD-10-CM | POA: Diagnosis not present

## 2022-12-07 DIAGNOSIS — E785 Hyperlipidemia, unspecified: Secondary | ICD-10-CM | POA: Diagnosis not present

## 2022-12-07 DIAGNOSIS — I255 Ischemic cardiomyopathy: Secondary | ICD-10-CM | POA: Diagnosis not present

## 2022-12-07 DIAGNOSIS — I700 Atherosclerosis of aorta: Secondary | ICD-10-CM | POA: Diagnosis not present

## 2022-12-07 DIAGNOSIS — I129 Hypertensive chronic kidney disease with stage 1 through stage 4 chronic kidney disease, or unspecified chronic kidney disease: Secondary | ICD-10-CM | POA: Diagnosis not present

## 2022-12-07 DIAGNOSIS — I4891 Unspecified atrial fibrillation: Secondary | ICD-10-CM | POA: Diagnosis not present

## 2022-12-07 DIAGNOSIS — R269 Unspecified abnormalities of gait and mobility: Secondary | ICD-10-CM | POA: Diagnosis not present

## 2022-12-07 DIAGNOSIS — R7301 Impaired fasting glucose: Secondary | ICD-10-CM | POA: Diagnosis not present

## 2022-12-07 DIAGNOSIS — D649 Anemia, unspecified: Secondary | ICD-10-CM | POA: Diagnosis not present

## 2022-12-07 DIAGNOSIS — I2510 Atherosclerotic heart disease of native coronary artery without angina pectoris: Secondary | ICD-10-CM | POA: Diagnosis not present

## 2022-12-07 NOTE — Telephone Encounter (Signed)
This is a CHF pt 

## 2022-12-10 DIAGNOSIS — H43811 Vitreous degeneration, right eye: Secondary | ICD-10-CM | POA: Diagnosis not present

## 2022-12-10 DIAGNOSIS — H4311 Vitreous hemorrhage, right eye: Secondary | ICD-10-CM | POA: Diagnosis not present

## 2022-12-14 ENCOUNTER — Ambulatory Visit (HOSPITAL_COMMUNITY)
Admission: RE | Admit: 2022-12-14 | Discharge: 2022-12-14 | Disposition: A | Discharge status: Home / Self Care | Payer: PPO | Source: Ambulatory Visit | Attending: Cardiology | Admitting: Cardiology

## 2022-12-14 VITALS — BP 110/60 | HR 80 | Wt 154.6 lb

## 2022-12-14 DIAGNOSIS — Z950 Presence of cardiac pacemaker: Secondary | ICD-10-CM | POA: Diagnosis not present

## 2022-12-14 DIAGNOSIS — I2510 Atherosclerotic heart disease of native coronary artery without angina pectoris: Secondary | ICD-10-CM | POA: Insufficient documentation

## 2022-12-14 DIAGNOSIS — I252 Old myocardial infarction: Secondary | ICD-10-CM | POA: Diagnosis not present

## 2022-12-14 DIAGNOSIS — I482 Chronic atrial fibrillation, unspecified: Secondary | ICD-10-CM | POA: Insufficient documentation

## 2022-12-14 DIAGNOSIS — H35442 Age-related reticular degeneration of retina, left eye: Secondary | ICD-10-CM | POA: Diagnosis not present

## 2022-12-14 DIAGNOSIS — E785 Hyperlipidemia, unspecified: Secondary | ICD-10-CM | POA: Insufficient documentation

## 2022-12-14 DIAGNOSIS — H4311 Vitreous hemorrhage, right eye: Secondary | ICD-10-CM | POA: Diagnosis not present

## 2022-12-14 DIAGNOSIS — Z79899 Other long term (current) drug therapy: Secondary | ICD-10-CM | POA: Diagnosis not present

## 2022-12-14 DIAGNOSIS — I5022 Chronic systolic (congestive) heart failure: Secondary | ICD-10-CM

## 2022-12-14 DIAGNOSIS — I442 Atrioventricular block, complete: Secondary | ICD-10-CM | POA: Insufficient documentation

## 2022-12-14 DIAGNOSIS — Z7901 Long term (current) use of anticoagulants: Secondary | ICD-10-CM | POA: Insufficient documentation

## 2022-12-14 DIAGNOSIS — H35051 Retinal neovascularization, unspecified, right eye: Secondary | ICD-10-CM | POA: Diagnosis not present

## 2022-12-14 DIAGNOSIS — I255 Ischemic cardiomyopathy: Secondary | ICD-10-CM | POA: Insufficient documentation

## 2022-12-14 DIAGNOSIS — H43812 Vitreous degeneration, left eye: Secondary | ICD-10-CM | POA: Diagnosis not present

## 2022-12-14 DIAGNOSIS — H35373 Puckering of macula, bilateral: Secondary | ICD-10-CM | POA: Diagnosis not present

## 2022-12-14 LAB — CBC
HCT: 37.7 % — ABNORMAL LOW (ref 39.0–52.0)
Hemoglobin: 12.7 g/dL — ABNORMAL LOW (ref 13.0–17.0)
MCH: 31.3 pg (ref 26.0–34.0)
MCHC: 33.7 g/dL (ref 30.0–36.0)
MCV: 92.9 fL (ref 80.0–100.0)
Platelets: 205 10*3/uL (ref 150–400)
RBC: 4.06 MIL/uL — ABNORMAL LOW (ref 4.22–5.81)
RDW: 13.7 % (ref 11.5–15.5)
WBC: 6.4 10*3/uL (ref 4.0–10.5)
nRBC: 0 % (ref 0.0–0.2)

## 2022-12-14 LAB — LIPID PANEL
Cholesterol: 130 mg/dL (ref 0–200)
HDL: 58 mg/dL (ref >40)
LDL Cholesterol: 59 mg/dL (ref 0–99)
Total CHOL/HDL Ratio: 2.2 RATIO
Triglycerides: 64 mg/dL (ref <150)
VLDL: 13 mg/dL (ref 0–40)

## 2022-12-14 LAB — BASIC METABOLIC PANEL
Anion gap: 9 (ref 5–15)
BUN: 31 mg/dL — ABNORMAL HIGH (ref 8–23)
CO2: 27 mmol/L (ref 22–32)
Calcium: 9.3 mg/dL (ref 8.9–10.3)
Chloride: 99 mmol/L (ref 98–111)
Creatinine, Ser: 1.68 mg/dL — ABNORMAL HIGH (ref 0.61–1.24)
GFR, Estimated: 37 mL/min — ABNORMAL LOW (ref >60)
Glucose, Bld: 91 mg/dL (ref 70–99)
Potassium: 3.9 mmol/L (ref 3.5–5.1)
Sodium: 135 mmol/L (ref 135–145)

## 2022-12-14 LAB — BRAIN NATRIURETIC PEPTIDE: B Natriuretic Peptide: 325.8 pg/mL — ABNORMAL HIGH (ref 0.0–100.0)

## 2022-12-14 NOTE — Patient Instructions (Signed)
There has been no changes to your medications.  Labs done today, your results will be available in MyChart, we will contact you for abnormal readings.  PLEASE WEAR COMPRESSION STOCKINGS.  Your physician recommends that you schedule a follow-up appointment in: 6 months (August 2024) ** please call the office in May to arrange your follow up appointment.**  If you have any questions or concerns before your next appointment please send [place] a message through [place] or call our office at [phone].    TO LEAVE A MESSAGE FOR THE NURSE SELECT OPTION 2, PLEASE LEAVE A MESSAGE INCLUDING: YOUR NAME DATE OF BIRTH CALL BACK NUMBER REASON FOR CALL**this is important as we prioritize the call backs  YOU WILL RECEIVE A CALL BACK THE SAME DAY AS LONG AS YOU CALL BEFORE 4:00 PM  At the [facility], you and your health needs are our priority. As part of our continuing mission to provide you with exceptional heart care, we have created designated Provider Care Teams. These Care Teams include your primary Cardiologist (physician) and Advanced Practice Providers (APPs- Physician Assistants and Nurse Practitioners) who all work together to provide you with the care you need, when you need it.   You may see any of the following providers on your designated Care Team at your next follow up: Dr Glori Bickers Dr [name] Dr. Roxana Hires, NP Lyda Jester, [place] [facility] [place], [place] Forestine Na, NP Audry Riles, PharmD   Please be sure to bring in all your medications bottles to every appointment.    Thank you for choosing [facility]

## 2022-12-15 NOTE — Progress Notes (Signed)
Patient ID: Mark Lane, male   DOB: 03/29/1922, 89 y.o.   MRN: [id number] PCP: Dr. Forde Dandy Cardiology: Dr. Aundra Dubin  89 y.o.with history of CAD and ischemic cardiomyopathy presents for followup of CAD and CHF.  He had an anterolateral STEMI in 7/11 with ostial occlusion of a large 2nd diagonal that was treated with BMS.  EF immediately post-MI was 35%.  He has also been noted to have Mobitz Type I 2nd degree AV block at times.  Last echo in 7/14 showed EF 30-35% with septal/apical akinesis and anterior hypokinesis.    In 2/20, he developed complete heart block and had implantation of a Medtronic CRT-P device.  He subsequently developed atrial fibrillation which has become chronic.   Echo was done in 9/20, showing EF 45-50%, mild LVH, normal RV.   Echo in 8/21 showed EF 50%, normal RV.   Patient was admitted with a UTI and delirium in 12/23, was very weak afterwards.   Patient is slowly getting stronger after his admission with UTI.  He is walking with a walker for stability but denies dyspnea on flat ground.  He tires more easily than in the past.  Weight is stable. No orthopnea/PND.  No chest pain.  No lightheadedness.   Medtronic device interrogation: atrial fibrillation chronically, fluid index < threshold, 99.3% BiV pacing.   Labs (6/13): LDL 68, HDL 40 Labs (2/14): K 3.9, creatinine 1.2 Labs (8/14): K 4.1, creatinine 1.4 Labs (12/14): creatinine 1.3 Labs (4/15): K 4.9, creatinine 1.92, BNP 494.6 Labs (9/15): K 3.9, creatinine 1.4, LDL 133, HDL 43 Labs (3/16): K 3.9, creatinine 1.22, LDL 76 Labs (10/16): K 3.8, creatinine 1.15 Labs (3/18): K 3.4, creatinine 1.22 Labs (7/18): LDL 43, HDL 43, LFTs normal Labs (10/18): K 4.1, creatinine 1.17, BNP 298 Labs (6/20): K 4.4, creatinine 1.27, hgb 11.7 Labs (9/20): K 4, creatinine 1.34, LDL 60 Labs (12/20): K 4.3, creatinine 1.64 Labs (8/21): hgb 12.8, K 3.7, creatinine 1.64, LFTs normal, TSH normal Labs (11/21): K 3.9, creatinine 1.47 Labs  (4/22): LDL 74, HDL 51, K 4.5, creatinine 1.52 Labs (8/22): hgb 12.6, K 4.3, creatinine 1.54, BNP 354 Labs (12/22): K 4.4, creatinine 1.47, BNP 454 Labs (1/24): K 5, creatinine 1.4  PMH: 1. Hyperlipidemia 2. Nephrolithiasis 3. CAD: Anterolateral STEMI in 7/11.  LHC with ostial total occlusion of a large D2, dominant LCx, small RCA.  Patient had BMS to D2.  4. Complete heart block: Has Medtronic CRT-P device.  5. Ischemic cardiomyopathy: Echo (7/11) with EF 35% and peri-apical akinesis, grade II diastolic dysfunction, normal RV size and systolic function.  Echo (7/14) with EF 30-35%, septal/apical akinesis, anterior hypokinesis, mild MR/AI.  - Echo (9/20): EF 45-50%, mild LVH, normal RV.  - Echo (8/21): EF 50%, normal RV 6. CKD 7. Atrial fibrillation: Chronic.  8. Chronic gastritis  SH: Married, lives in [place], prior tobacco smoker.  Has a farm, grows peaches. World War 2 veteran.   FH: CAD  ROS: All systems reviewed and negative except as per HPI  Current Outpatient Medications  Medication Sig Dispense Refill   carvedilol (COREG) 3.125 MG tablet TAKE ONE TABLET TWICE DAILY 180 tablet 3   cholecalciferol (VITAMIN D) 1000 UNITS tablet Take 1,000 Units by mouth daily. 2 tablets     ELIQUIS 2.5 MG TABS tablet TAKE  (1)  TABLET TWICE A DAY. 180 tablet 3   furosemide (LASIX) 20 MG tablet TAKE 1 TO 2 TABLETS DAILY AS DIRECTED (ALTERNATING DAYS) (Patient taking differently: Take 20 mg  by mouth daily.) 120 tablet 2   pantoprazole (PROTONIX) 40 MG tablet Take 1 tablet (40 mg total) by mouth daily. 90 tablet 3   simvastatin (ZOCOR) 40 MG tablet TAKE ONE TABLET DAILY AT 6PM 90 tablet 2   spironolactone (ALDACTONE) 25 MG tablet TAKE (1/2) TABLET DAILY. 45 tablet 3   vitamin B-12 (CYANOCOBALAMIN) 500 MCG tablet Take 1 tablet (500 mcg total) by mouth daily.     No current facility-administered medications for this encounter.   BP 110/60   Pulse 80   Wt 70.1 kg (154 lb 9.6 oz)   SpO2 100%    BMI 25.73 kg/m  General: NAD Neck: No JVD, no thyromegaly or thyroid nodule.  Lungs: Clear to auscultation bilaterally with normal respiratory effort. CV: Nondisplaced PMI.  Heart irregular S1/S2, no S3/S4, 1/6 SEM RUSB.  1+ ankle edema.  No carotid bruit.  Normal pedal pulses.  Abdomen: Soft, nontender, no hepatosplenomegaly, no distention.  Skin: Intact without lesions or rashes.  Neurologic: Alert and oriented x 3.  Psych: Normal affect. Extremities: No clubbing or cyanosis.  HEENT: Normal.   Assessment/Plan: 1. CAD: No chest pain.    - No ASA given stable CAD and Eliquis use.  - Continue statin.  2. Chronic systolic CHF: Ischemic cardiomyopathy.  Now with Medtronic CRT-P device.  Echo in 8/21 with EF up to 50% with normal RV.  NYHA class 2-3 symptoms.  He has peripheral edema but no JVD, suspect venous insufficiency.    - Continue Lasix 20 daily.  BMET/BNP today.  - I asked him to wear graded compression stockings.  - Continue spironolactone 12.5 daily.  - Continue Coreg 3.125 mg bid.  - Off lisinopril with soft BP.   3. Hyperlipidemia:  Continue Zocor, check lipids today.  4. Complete heart block: S/p MDT CRT-P device placement. Appropriate BiV pacing on device check today.  5. Atrial fibrillation: Chronic.   - Continue apixaban 2.5 mg bid (age, creatinine > 1.5).  CBC today.   Followup in 6 months.   [name] 12/15/2022

## 2022-12-24 ENCOUNTER — Other Ambulatory Visit (INDEPENDENT_AMBULATORY_CARE_PROVIDER_SITE_OTHER): Payer: Self-pay | Admitting: *Deleted

## 2022-12-24 DIAGNOSIS — K5900 Constipation, unspecified: Secondary | ICD-10-CM

## 2022-12-24 MED ORDER — PANTOPRAZOLE SODIUM 40 MG PO TBEC: 40.0000 mg | DELAYED_RELEASE_TABLET | Freq: Every day | ORAL | 0 refills | Status: DC

## 2022-12-25 ENCOUNTER — Other Ambulatory Visit (INDEPENDENT_AMBULATORY_CARE_PROVIDER_SITE_OTHER): Payer: Self-pay | Admitting: Gastroenterology

## 2022-12-25 ENCOUNTER — Encounter (INDEPENDENT_AMBULATORY_CARE_PROVIDER_SITE_OTHER): Payer: Self-pay | Admitting: Gastroenterology

## 2022-12-25 ENCOUNTER — Ambulatory Visit (INDEPENDENT_AMBULATORY_CARE_PROVIDER_SITE_OTHER): Payer: PPO | Admitting: Gastroenterology

## 2022-12-25 VITALS — BP 120/77 | HR 84 | Ht 65.0 in | Wt 147.4 lb

## 2022-12-25 DIAGNOSIS — K5900 Constipation, unspecified: Secondary | ICD-10-CM

## 2022-12-25 DIAGNOSIS — K219 Gastro-esophageal reflux disease without esophagitis: Secondary | ICD-10-CM | POA: Diagnosis not present

## 2022-12-25 NOTE — Addendum Note (Signed)
Addended by: Harvel Quale on: 12/25/2022 09:02 PM   Modules accepted: Level of Service

## 2022-12-25 NOTE — Progress Notes (Addendum)
Referring Provider: Reynold Bowen, MD Primary Care Physician:  Reynold Bowen, MD Primary GI Physician: Jenetta Downer   Chief Complaint  Patient presents with   Gastroesophageal Reflux    Patient arrives with son Mark Lane. Follow up on GERD and constipation. Has Bm daily and reports no concerns today.    HPI:   Mark Lane is a 89 y.o. male with past medical history of  CHF, CAD, HLD.   Patient presenting today for follow up  of GERD and constipation.   Last seen feb 2023, at that time, arrived with son who provided history, maintained on protonix 40m daily, doing well on this. Denied breakthrough symptoms. Having some constipation, going 3-4 days without a BM, having to strain, not drinking much water.   Recommended to continue protonix 448mdaily, increase water intake, fruits, veggies, whole grains, try otc metamucil, chewing precautions.   Present: Patient reports he had recent hospital admission for UTI in December which caused some delirium. He is feeling mch better. He is doing well on protonix 4066mnce daily. No breakthrough symptoms. Appetite is good, son reports he eats most anything. He reports he chews thoroughly given lack of teeth, no dysphagia as he eats very slowly. Denies nausea or vomiting. Weight is stable   He is having a BM every morning. Not taking anything for constipation. Denies abdominal pain. No rectal bleeding or melena.    Last Endoscopy:07/05/20 - 4 cm hiatal hernia. - Nodular mucosa in the gastric body. Biopsied. - A few gastric polyps. - Normal examined duodenum. Neg H pylori   Past Medical History:  Diagnosis Date   CHF (congestive heart failure) ([facility]inder  Coronary artery disease    History of kidney stones    years ago   Hyperlipidemia     Past Surgical History:  Procedure Laterality Date   BIOPSY  07/05/2020   Procedure: BIOPSY;  Surgeon: CasHarvel QualeD;  [facility];  Service: Gastroenterology;;   BIV PACEMAKER  INSERTION CRT-P N/A 12/22/2018   Procedure: BIV PACEMAKER INSERTION CRT-P;  Surgeon: TayEvans LanceD;  Location: [facility];  Service: Cardiovascular;  Laterality: N/A;   CARDIAC CATHETERIZATION     ESOPHAGOGASTRODUODENOSCOPY (EGD) WITH PROPOFOL N/A 07/05/2020   Procedure: ESOPHAGOGASTRODUODENOSCOPY (EGD) WITH PROPOFOL;  Surgeon: CasHarvel QualeD;  Location: [facility];  Service: Gastroenterology;  Laterality: N/A;   HEMORRHOID SURGERY     removed a hemorrhoid in the 1970s   KIDNEY STONE SURGERY     remove a kidney stone    Current Outpatient Medications  Medication Sig Dispense Refill   carvedilol (COREG) 3.125 MG tablet TAKE ONE TABLET TWICE DAILY 180 tablet 3   cholecalciferol (VITAMIN D) 1000 UNITS tablet Take 1,000 Units by mouth daily. 2 tablets     ELIQUIS 2.5 MG TABS tablet TAKE  (1)  TABLET TWICE A DAY. 180 tablet 3   furosemide (LASIX) 20 MG tablet TAKE 1 TO 2 TABLETS DAILY AS DIRECTED (ALTERNATING DAYS) (Patient taking differently: Take 20 mg by mouth daily.) 120 tablet 2   pantoprazole (PROTONIX) 40 MG tablet Take 1 tablet (40 mg total) by mouth daily. 90 tablet 0   simvastatin (ZOCOR) 40 MG tablet TAKE ONE TABLET DAILY AT 6PM 90 tablet 2   spironolactone (ALDACTONE) 25 MG tablet TAKE (1/2) TABLET DAILY. 45 tablet 3   vitamin B-12 (CYANOCOBALAMIN) 500 MCG tablet Take 1 tablet (500 mcg total) by mouth daily.     No current  facility-administered medications for this visit.    Allergies as of 12/25/2022   (No Known Allergies)    Family History  Problem Relation Age of Onset   Heart attack Father        in his 4's   Cancer Brother        1/2   Cancer Sister    Heart Problems Brother 36       pacemaker 2/2    Social History   Socioeconomic History   Marital status: Married    Spouse name: Not on file   Number of children: Not on file   Years of education: Not on file   Highest education level: Not on file  Occupational History   Not  on file  Tobacco Use   Smoking status: Former    Passive exposure: Past   Smokeless tobacco: Never  Substance and Sexual Activity   Alcohol use: No   Drug use: No   Sexual activity: Not on file  Other Topics Concern   Not on file  Social History Narrative   He is married, has 1-son. He does not smoke, or use alcohol   Social Determinants of Radio broadcast assistant Strain: Not on file  Food Insecurity: Not on file  Transportation Needs: Not on file  Physical Activity: Not on file  Stress: Not on file  Social Connections: Not on file    Review of systems General: negative for malaise, night sweats, fever, chills, weight loss Neck: Negative for lumps, goiter, pain and significant neck swelling Resp: Negative for cough, wheezing, dyspnea at rest CV: Negative for chest pain, leg swelling, palpitations, orthopnea GI: denies melena, hematochezia, nausea, vomiting, diarrhea, constipation, dysphagia, odyonophagia, early satiety or unintentional weight loss.  MSK: Negative for joint pain or swelling, back pain, and muscle pain. Derm: Negative for itching or rash Psych: Denies depression, anxiety, memory loss, confusion. No homicidal or suicidal ideation.  Heme: Negative for prolonged bleeding, bruising easily, and swollen nodes. Endocrine: Negative for cold or heat intolerance, polyuria, polydipsia and goiter. Neuro: negative for tremor, gait imbalance, syncope and seizures. The remainder of the review of systems is noncontributory.  Physical Exam: BP 120/77 (BP Location: Left Arm, Patient Position: Sitting, Cuff Size: Normal)   Pulse 84   Ht 5' 5"$  (1.651 m)   Wt 147 lb 6.4 oz (66.9 kg)   BMI 24.53 kg/m  General:   Alert and oriented. No distress noted. Pleasant and cooperative.  Head:  Normocephalic and atraumatic. Eyes:  Conjuctiva clear without scleral icterus. Mouth:  Oral mucosa pink and moist. Good dentition. No lesions. Heart: Normal rate and rhythm, s1 and s2 heart  sounds present.  Lungs: Clear lung sounds in all lobes. Respirations equal and unlabored. Abdomen:  +BS, soft, non-tender and non-distended. No rebound or guarding. No HSM or masses noted. Derm: No palmar erythema or jaundice Msk:  Symmetrical without gross deformities. Normal posture. Extremities:  Without edema. Neurologic:  Alert and  oriented x4 Psych:  Alert and cooperative. Normal mood and affect.  Invalid input(s): "6 MONTHS"   ASSESSMENT: Mark Lane is a 89 y.o. male presenting today for follow up of GERD/Constipation.  GERD: well managed on protonix 8m once daily. No breakthrough symptoms, nausea, vomiting, early satiety or dysphagia. Will continue with current PPI regimen and good reflux precautions to include avoiding greasy, spicy, fried, citrus foods, and be mindful that caffeine, carbonated drinks, chocolate and alcohol can increase reflux symptoms. Stay upright 2-3 hours after eating, prior  to lying down and avoid eating late in the evenings.  Constipation is not an issue at this time. Having a BM daily without the use of anything. Denies rectal bleeding, melena, abdominal pain. Encouraged good water intake, diet high in fruits, veggies, whole grains.   PLAN:  Continue protonix 76m daily, reflux precautions  2. Good water intake  3. Diet high in fruits and veggies   All questions were answered, patient and son verbalized understanding and are in agreement with plan as outlined above.   Follow Up: 1 year   Mark Lane L. CAlver Sorrow MSN, APRN, AGNP-C Adult-Gerontology Nurse Practitioner [facility]  I have reviewed the note and agree with the APP's assessment as described in this progress note  DMaylon Peppers MD Gastroenterology and Hepatology [facility]

## 2022-12-25 NOTE — Patient Instructions (Signed)
It was so nice to see you and I Hope you have a very Happy Birthday coming up!! We will continue with protonix 8m once daily for acid reflux For constipation, aim for good water intake, diet high in fruits, veggies and whole grains, kiwi and prunes are especially good for constipation  Please let me know if you have any new or worsening symptoms  Follow up 1 year  I want to create trusting relationships with patients and provide genuine, compassionate, and quality care. I truly value your feedback! please be on the lookout for a survey regarding your visit with me today. I appreciate your input about our visit and your time in completing this!    Gjon Letarte L. CAlver Sorrow MSN, APRN, AGNP-C Adult-Gerontology Nurse Practitioner [facility]

## 2022-12-25 NOTE — Telephone Encounter (Signed)
Has appt today. Will fill at appt if med not changed.

## 2022-12-26 ENCOUNTER — Ambulatory Visit: Payer: PPO | Attending: Internal Medicine

## 2022-12-26 DIAGNOSIS — I442 Atrioventricular block, complete: Secondary | ICD-10-CM

## 2022-12-26 LAB — CUP PACEART REMOTE DEVICE CHECK
Battery Remaining Longevity: 16 mo
Battery Voltage: 2.85 V
Brady Statistic RA Percent Paced: 0.57 %
Brady Statistic RV Percent Paced: 97.02 %
Date Time Interrogation Session: 20240220193430
Implantable Lead Connection Status: 753985
Implantable Lead Connection Status: 753985
Implantable Lead Connection Status: 753985
Implantable Lead Implant Date: 20200217
Implantable Lead Implant Date: 20200217
Implantable Lead Implant Date: 20200217
Implantable Lead Location: 753858
Implantable Lead Location: 753859
Implantable Lead Location: 753860
Implantable Lead Model: 4398
Implantable Lead Model: 5076
Implantable Lead Model: 5076
Implantable Pulse Generator Implant Date: 20200217
Lead Channel Impedance Value: 304 Ohm
Lead Channel Impedance Value: 323 Ohm
Lead Channel Impedance Value: 361 Ohm
Lead Channel Impedance Value: 418 Ohm
Lead Channel Impedance Value: 437 Ohm
Lead Channel Impedance Value: 456 Ohm
Lead Channel Impedance Value: 494 Ohm
Lead Channel Impedance Value: 532 Ohm
Lead Channel Impedance Value: 532 Ohm
Lead Channel Impedance Value: 741 Ohm
Lead Channel Impedance Value: 760 Ohm
Lead Channel Impedance Value: 855 Ohm
Lead Channel Impedance Value: 874 Ohm
Lead Channel Impedance Value: 874 Ohm
Lead Channel Pacing Threshold Amplitude: 0.5 V
Lead Channel Pacing Threshold Amplitude: 1.375 V
Lead Channel Pacing Threshold Amplitude: 2 V
Lead Channel Pacing Threshold Pulse Width: 0.4 ms
Lead Channel Pacing Threshold Pulse Width: 0.4 ms
Lead Channel Pacing Threshold Pulse Width: 1 ms
Lead Channel Sensing Intrinsic Amplitude: 0.5 mV
Lead Channel Sensing Intrinsic Amplitude: 0.5 mV
Lead Channel Sensing Intrinsic Amplitude: 16.125 mV
Lead Channel Sensing Intrinsic Amplitude: 16.125 mV
Lead Channel Setting Pacing Amplitude: 2 V
Lead Channel Setting Pacing Amplitude: 2.5 V
Lead Channel Setting Pacing Amplitude: 4 V
Lead Channel Setting Pacing Pulse Width: 0.4 ms
Lead Channel Setting Pacing Pulse Width: 1 ms
Lead Channel Setting Sensing Sensitivity: 1.2 mV
Zone Setting Status: 755011
Zone Setting Status: 755011

## 2023-01-10 DIAGNOSIS — H43812 Vitreous degeneration, left eye: Secondary | ICD-10-CM | POA: Diagnosis not present

## 2023-01-10 DIAGNOSIS — H35039 Hypertensive retinopathy, unspecified eye: Secondary | ICD-10-CM | POA: Diagnosis not present

## 2023-01-10 DIAGNOSIS — H35051 Retinal neovascularization, unspecified, right eye: Secondary | ICD-10-CM | POA: Diagnosis not present

## 2023-01-10 DIAGNOSIS — H35373 Puckering of macula, bilateral: Secondary | ICD-10-CM | POA: Diagnosis not present

## 2023-01-10 DIAGNOSIS — H4311 Vitreous hemorrhage, right eye: Secondary | ICD-10-CM | POA: Diagnosis not present

## 2023-01-10 DIAGNOSIS — H35442 Age-related reticular degeneration of retina, left eye: Secondary | ICD-10-CM | POA: Diagnosis not present

## 2023-01-11 DIAGNOSIS — N3001 Acute cystitis with hematuria: Secondary | ICD-10-CM | POA: Diagnosis not present

## 2023-01-11 DIAGNOSIS — Z7901 Long term (current) use of anticoagulants: Secondary | ICD-10-CM | POA: Diagnosis not present

## 2023-01-11 DIAGNOSIS — R2689 Other abnormalities of gait and mobility: Secondary | ICD-10-CM | POA: Diagnosis not present

## 2023-01-11 DIAGNOSIS — N183 Chronic kidney disease, stage 3 unspecified: Secondary | ICD-10-CM | POA: Diagnosis not present

## 2023-01-11 DIAGNOSIS — R001 Bradycardia, unspecified: Secondary | ICD-10-CM | POA: Diagnosis not present

## 2023-01-11 DIAGNOSIS — R42 Dizziness and giddiness: Secondary | ICD-10-CM | POA: Diagnosis not present

## 2023-01-11 DIAGNOSIS — I429 Cardiomyopathy, unspecified: Secondary | ICD-10-CM | POA: Diagnosis not present

## 2023-01-11 DIAGNOSIS — I4819 Other persistent atrial fibrillation: Secondary | ICD-10-CM | POA: Diagnosis not present

## 2023-01-11 DIAGNOSIS — E785 Hyperlipidemia, unspecified: Secondary | ICD-10-CM | POA: Diagnosis not present

## 2023-01-11 DIAGNOSIS — M6281 Muscle weakness (generalized): Secondary | ICD-10-CM | POA: Diagnosis not present

## 2023-01-24 NOTE — Progress Notes (Signed)
Remote pacemaker transmission.   

## 2023-01-28 ENCOUNTER — Encounter: Payer: Self-pay | Admitting: Cardiology

## 2023-03-07 ENCOUNTER — Other Ambulatory Visit (HOSPITAL_COMMUNITY): Payer: Self-pay | Admitting: Cardiology

## 2023-03-07 DIAGNOSIS — H35442 Age-related reticular degeneration of retina, left eye: Secondary | ICD-10-CM | POA: Diagnosis not present

## 2023-03-07 DIAGNOSIS — H35051 Retinal neovascularization, unspecified, right eye: Secondary | ICD-10-CM | POA: Diagnosis not present

## 2023-03-07 DIAGNOSIS — H35033 Hypertensive retinopathy, bilateral: Secondary | ICD-10-CM | POA: Diagnosis not present

## 2023-03-07 DIAGNOSIS — H4311 Vitreous hemorrhage, right eye: Secondary | ICD-10-CM | POA: Diagnosis not present

## 2023-03-07 DIAGNOSIS — H35373 Puckering of macula, bilateral: Secondary | ICD-10-CM | POA: Diagnosis not present

## 2023-03-07 DIAGNOSIS — I442 Atrioventricular block, complete: Secondary | ICD-10-CM

## 2023-03-07 DIAGNOSIS — H43812 Vitreous degeneration, left eye: Secondary | ICD-10-CM | POA: Diagnosis not present

## 2023-03-27 ENCOUNTER — Ambulatory Visit (INDEPENDENT_AMBULATORY_CARE_PROVIDER_SITE_OTHER): Payer: PPO

## 2023-03-27 DIAGNOSIS — I442 Atrioventricular block, complete: Secondary | ICD-10-CM | POA: Diagnosis not present

## 2023-03-27 LAB — CUP PACEART REMOTE DEVICE CHECK
Battery Remaining Longevity: 11 mo
Battery Voltage: 2.81 V
Brady Statistic RA Percent Paced: 0.68 %
Brady Statistic RV Percent Paced: 97.48 %
Date Time Interrogation Session: 20240522034600
Implantable Lead Connection Status: 753985
Implantable Lead Connection Status: 753985
Implantable Lead Connection Status: 753985
Implantable Lead Implant Date: 20200217
Implantable Lead Implant Date: 20200217
Implantable Lead Implant Date: 20200217
Implantable Lead Location: 753858
Implantable Lead Location: 753859
Implantable Lead Location: 753860
Implantable Lead Model: 4398
Implantable Lead Model: 5076
Implantable Lead Model: 5076
Implantable Pulse Generator Implant Date: 20200217
Lead Channel Impedance Value: 285 Ohm
Lead Channel Impedance Value: 304 Ohm
Lead Channel Impedance Value: 342 Ohm
Lead Channel Impedance Value: 418 Ohm
Lead Channel Impedance Value: 437 Ohm
Lead Channel Impedance Value: 437 Ohm
Lead Channel Impedance Value: 475 Ohm
Lead Channel Impedance Value: 494 Ohm
Lead Channel Impedance Value: 570 Ohm
Lead Channel Impedance Value: 741 Ohm
Lead Channel Impedance Value: 779 Ohm
Lead Channel Impedance Value: 817 Ohm
Lead Channel Impedance Value: 836 Ohm
Lead Channel Impedance Value: 836 Ohm
Lead Channel Pacing Threshold Amplitude: 0.5 V
Lead Channel Pacing Threshold Amplitude: 1.5 V
Lead Channel Pacing Threshold Amplitude: 1.5 V
Lead Channel Pacing Threshold Pulse Width: 0.4 ms
Lead Channel Pacing Threshold Pulse Width: 0.4 ms
Lead Channel Pacing Threshold Pulse Width: 1 ms
Lead Channel Sensing Intrinsic Amplitude: 0.375 mV
Lead Channel Sensing Intrinsic Amplitude: 0.375 mV
Lead Channel Sensing Intrinsic Amplitude: 13.625 mV
Lead Channel Sensing Intrinsic Amplitude: 13.625 mV
Lead Channel Setting Pacing Amplitude: 2.5 V
Lead Channel Setting Pacing Amplitude: 2.5 V
Lead Channel Setting Pacing Amplitude: 3.25 V
Lead Channel Setting Pacing Pulse Width: 0.4 ms
Lead Channel Setting Pacing Pulse Width: 1 ms
Lead Channel Setting Sensing Sensitivity: 1.2 mV
Zone Setting Status: 755011
Zone Setting Status: 755011

## 2023-04-18 NOTE — Progress Notes (Signed)
Remote pacemaker transmission.   

## 2023-06-26 ENCOUNTER — Ambulatory Visit: Payer: PPO

## 2023-06-26 DIAGNOSIS — I442 Atrioventricular block, complete: Secondary | ICD-10-CM

## 2023-06-26 LAB — CUP PACEART REMOTE DEVICE CHECK
Battery Remaining Longevity: 10 mo
Battery Voltage: 2.76 V
Brady Statistic RA Percent Paced: 0.67 %
Brady Statistic RV Percent Paced: 98.88 %
Date Time Interrogation Session: 20240820201756
Implantable Lead Connection Status: 753985
Implantable Lead Connection Status: 753985
Implantable Lead Connection Status: 753985
Implantable Lead Implant Date: 20200217
Implantable Lead Implant Date: 20200217
Implantable Lead Implant Date: 20200217
Implantable Lead Location: 753858
Implantable Lead Location: 753859
Implantable Lead Location: 753860
Implantable Lead Model: 4398
Implantable Lead Model: 5076
Implantable Lead Model: 5076
Implantable Pulse Generator Implant Date: 20200217
Lead Channel Impedance Value: 285 Ohm
Lead Channel Impedance Value: 304 Ohm
Lead Channel Impedance Value: 342 Ohm
Lead Channel Impedance Value: 418 Ohm
Lead Channel Impedance Value: 418 Ohm
Lead Channel Impedance Value: 437 Ohm
Lead Channel Impedance Value: 456 Ohm
Lead Channel Impedance Value: 456 Ohm
Lead Channel Impedance Value: 513 Ohm
Lead Channel Impedance Value: 722 Ohm
Lead Channel Impedance Value: 760 Ohm
Lead Channel Impedance Value: 760 Ohm
Lead Channel Impedance Value: 760 Ohm
Lead Channel Impedance Value: 798 Ohm
Lead Channel Pacing Threshold Amplitude: 0.5 V
Lead Channel Pacing Threshold Amplitude: 1.375 V
Lead Channel Pacing Threshold Amplitude: 2 V
Lead Channel Pacing Threshold Pulse Width: 0.4 ms
Lead Channel Pacing Threshold Pulse Width: 0.4 ms
Lead Channel Pacing Threshold Pulse Width: 1 ms
Lead Channel Sensing Intrinsic Amplitude: 0.5 mV
Lead Channel Sensing Intrinsic Amplitude: 0.5 mV
Lead Channel Sensing Intrinsic Amplitude: 13.625 mV
Lead Channel Sensing Intrinsic Amplitude: 13.625 mV
Lead Channel Setting Pacing Amplitude: 2.5 V
Lead Channel Setting Pacing Amplitude: 2.5 V
Lead Channel Setting Pacing Amplitude: 4 V
Lead Channel Setting Pacing Pulse Width: 0.4 ms
Lead Channel Setting Pacing Pulse Width: 1 ms
Lead Channel Setting Sensing Sensitivity: 1.2 mV
Zone Setting Status: 755011
Zone Setting Status: 755011

## 2023-07-10 NOTE — Progress Notes (Signed)
Remote pacemaker transmission.   

## 2023-07-26 DIAGNOSIS — D6869 Other thrombophilia: Secondary | ICD-10-CM | POA: Diagnosis not present

## 2023-07-26 DIAGNOSIS — E261 Secondary hyperaldosteronism: Secondary | ICD-10-CM | POA: Diagnosis not present

## 2023-07-26 DIAGNOSIS — I482 Chronic atrial fibrillation, unspecified: Secondary | ICD-10-CM | POA: Diagnosis not present

## 2023-07-26 DIAGNOSIS — I5042 Chronic combined systolic (congestive) and diastolic (congestive) heart failure: Secondary | ICD-10-CM | POA: Diagnosis not present

## 2023-07-27 DIAGNOSIS — Z87440 Personal history of urinary (tract) infections: Secondary | ICD-10-CM | POA: Diagnosis not present

## 2023-07-27 DIAGNOSIS — N186 End stage renal disease: Secondary | ICD-10-CM | POA: Diagnosis not present

## 2023-07-27 DIAGNOSIS — R5383 Other fatigue: Secondary | ICD-10-CM | POA: Diagnosis not present

## 2023-07-31 ENCOUNTER — Other Ambulatory Visit (HOSPITAL_COMMUNITY): Payer: Self-pay | Admitting: Cardiology

## 2023-08-05 ENCOUNTER — Other Ambulatory Visit (HOSPITAL_COMMUNITY): Payer: Self-pay | Admitting: Cardiology

## 2023-08-19 ENCOUNTER — Other Ambulatory Visit (HOSPITAL_COMMUNITY): Payer: Self-pay | Admitting: Cardiology

## 2023-08-30 ENCOUNTER — Encounter (HOSPITAL_COMMUNITY): Payer: Self-pay | Admitting: Cardiology

## 2023-08-30 ENCOUNTER — Ambulatory Visit (HOSPITAL_COMMUNITY)
Admission: RE | Admit: 2023-08-30 | Discharge: 2023-08-30 | Disposition: A | Discharge status: Home / Self Care | Payer: PPO | Source: Ambulatory Visit | Attending: Cardiology | Admitting: Cardiology

## 2023-08-30 VITALS — BP 100/60 | HR 61 | Wt 148.8 lb

## 2023-08-30 DIAGNOSIS — I2510 Atherosclerotic heart disease of native coronary artery without angina pectoris: Secondary | ICD-10-CM | POA: Diagnosis not present

## 2023-08-30 DIAGNOSIS — Z79899 Other long term (current) drug therapy: Secondary | ICD-10-CM | POA: Insufficient documentation

## 2023-08-30 DIAGNOSIS — I5022 Chronic systolic (congestive) heart failure: Secondary | ICD-10-CM | POA: Insufficient documentation

## 2023-08-30 DIAGNOSIS — Z7901 Long term (current) use of anticoagulants: Secondary | ICD-10-CM | POA: Diagnosis not present

## 2023-08-30 DIAGNOSIS — I255 Ischemic cardiomyopathy: Secondary | ICD-10-CM | POA: Diagnosis not present

## 2023-08-30 DIAGNOSIS — I482 Chronic atrial fibrillation, unspecified: Secondary | ICD-10-CM | POA: Diagnosis not present

## 2023-08-30 DIAGNOSIS — E785 Hyperlipidemia, unspecified: Secondary | ICD-10-CM | POA: Insufficient documentation

## 2023-08-30 LAB — BASIC METABOLIC PANEL
Anion gap: 7 (ref 5–15)
BUN: 16 mg/dL (ref 8–23)
CO2: 28 mmol/L (ref 22–32)
Calcium: 9.1 mg/dL (ref 8.9–10.3)
Chloride: 102 mmol/L (ref 98–111)
Creatinine, Ser: 1.62 mg/dL — ABNORMAL HIGH (ref 0.61–1.24)
GFR, Estimated: 38 mL/min — ABNORMAL LOW (ref >60)
Glucose, Bld: 103 mg/dL — ABNORMAL HIGH (ref 70–99)
Potassium: 4.2 mmol/L (ref 3.5–5.1)
Sodium: 137 mmol/L (ref 135–145)

## 2023-08-30 LAB — CBC
HCT: 36.5 % — ABNORMAL LOW (ref 39.0–52.0)
Hemoglobin: 11.9 g/dL — ABNORMAL LOW (ref 13.0–17.0)
MCH: 30.2 pg (ref 26.0–34.0)
MCHC: 32.6 g/dL (ref 30.0–36.0)
MCV: 92.6 fL (ref 80.0–100.0)
Platelets: 208 10*3/uL (ref 150–400)
RBC: 3.94 MIL/uL — ABNORMAL LOW (ref 4.22–5.81)
RDW: 13.6 % (ref 11.5–15.5)
WBC: 7.6 10*3/uL (ref 4.0–10.5)
nRBC: 0 % (ref 0.0–0.2)

## 2023-08-30 LAB — BRAIN NATRIURETIC PEPTIDE: B Natriuretic Peptide: 364.5 pg/mL — ABNORMAL HIGH (ref 0.0–100.0)

## 2023-08-30 MED ORDER — METOPROLOL SUCCINATE ER 25 MG PO TB24: 12.5000 mg | ORAL_TABLET | Freq: Every day | ORAL | 5 refills | Status: AC

## 2023-08-30 NOTE — Patient Instructions (Signed)
Good to see you today!  STOP Coreg  START Toprol 12.5 mg (1/2 tablet) daily  Labs done today, your results will be available in MyChart, we will contact you for abnormal readings.  Your physician recommends that you schedule a follow-up appointment in: 6 months(April 2025) Call office in February to schedule an appointment.  If you have any questions or concerns before your next appointment please send [place] a message through [place] or call our office at [phone].    TO LEAVE A MESSAGE FOR THE NURSE SELECT OPTION 2, PLEASE LEAVE A MESSAGE INCLUDING: YOUR NAME DATE OF BIRTH CALL BACK NUMBER REASON FOR CALL**this is important as we prioritize the call backs  YOU WILL RECEIVE A CALL BACK THE SAME DAY AS LONG AS YOU CALL BEFORE 4:00 PM  At the [facility], you and your health needs are our priority. As part of our continuing mission to provide you with exceptional heart care, we have created designated Provider Care Teams. These Care Teams include your primary Cardiologist (physician) and Advanced Practice Providers (APPs- Physician Assistants and Nurse Practitioners) who all work together to provide you with the care you need, when you need it.   You may see any of the following providers on your designated Care Team at your next follow up: Dr Arvilla Meres Dr Marca Ancona Dr. Dorthula Nettles Dr. Clearnce Hasten Amy Filbert Schilder, NP Robbie Lis, [facility] [place], [place] Brynda Peon, NP [place] Lee, NP Karle Plumber, PharmD   Please be sure to bring in all your medications bottles to every appointment.    Thank you for choosing [facility]

## 2023-08-30 NOTE — Progress Notes (Signed)
Medication Samples have been provided to the patient.  Drug name: Eliquis       Strength: 2.5 mg        Qty: 4  LOT: [id number]  Exp.Date: 11/2024  Dosing instructions: Take 1 tablet Twice daily   The patient has been instructed regarding the correct time, dose, and frequency of taking this medication, including desired effects and most common side effects.   Smitty Cords Caelan Atchley 3:44 PM 08/30/2023

## 2023-09-01 NOTE — Progress Notes (Signed)
Patient ID: Mark Lane, male   DOB: [birth year], 89 y.o.   MRN: [id number] PCP: Dr. Evlyn Kanner Cardiology: Dr. Shirlee Latch  89 y.o.with history of CAD and ischemic cardiomyopathy presents for followup of CAD and CHF.  He had an anterolateral STEMI in 7/11 with ostial occlusion of a large 2nd diagonal that was treated with BMS.  EF immediately post-MI was 35%.  He has also been noted to have Mobitz Type I 2nd degree AV block at times.  Last echo in 7/14 showed EF 30-35% with septal/apical akinesis and anterior hypokinesis.    In 2/20, he developed complete heart block and had implantation of a Medtronic CRT-P device.  He subsequently developed atrial fibrillation which has become chronic.   Echo was done in 9/20, showing EF 45-50%, mild LVH, normal RV.   Echo in 8/21 showed EF 50%, normal RV.   Patient was admitted with a UTI and delirium in 12/23, was very weak afterwards.   Weight is down 6 lbs.  He is more unsteady/balance poor, has fallen a couple of times.  No dyspnea walking on flat ground but legs feel weak.  He can get to the mailbox and back without problems.  He uses a cane or a walker generally.  No chest pain.  No orthopnea/PND.  He does have some orthostatic-type symptoms and BP is running relatively low.   ECG (personally reviewed): atrial fibrillation  Labs (6/13): LDL 68, HDL 40 Labs (2/14): K 3.9, creatinine 1.2 Labs (8/14): K 4.1, creatinine 1.4 Labs (12/14): creatinine 1.3 Labs (4/15): K 4.9, creatinine 1.92, BNP 494.6 Labs (9/15): K 3.9, creatinine 1.4, LDL 133, HDL 43 Labs (3/16): K 3.9, creatinine 1.22, LDL 76 Labs (10/16): K 3.8, creatinine 1.15 Labs (3/18): K 3.4, creatinine 1.22 Labs (7/18): LDL 43, HDL 43, LFTs normal Labs (46/96): K 4.1, creatinine 1.17, BNP 298 Labs (6/20): K 4.4, creatinine 1.27, hgb 11.7 Labs (9/20): K 4, creatinine 1.34, LDL 60 Labs (12/20): K 4.3, creatinine 1.64 Labs (8/21): hgb 12.8, K 3.7, creatinine 1.64, LFTs normal, TSH normal Labs (11/21): K  3.9, creatinine 1.47 Labs (4/22): LDL 74, HDL 51, K 4.5, creatinine 2.95 Labs (8/22): hgb 12.6, K 4.3, creatinine 1.54, BNP 354 Labs (12/22): K 4.4, creatinine 1.47, BNP 454 Labs (1/24): K 5, creatinine 1.4 Labs (2/24): LDL 59, BNP 326, K 3.9, creatinine 1.68  PMH: 1. Hyperlipidemia 2. Nephrolithiasis 3. CAD: Anterolateral STEMI in 7/11.  LHC with ostial total occlusion of a large D2, dominant LCx, small RCA.  Patient had BMS to D2.  4. Complete heart block: Has Medtronic CRT-P device.  5. Ischemic cardiomyopathy: Echo (7/11) with EF 35% and peri-apical akinesis, grade II diastolic dysfunction, normal RV size and systolic function.  Echo (7/14) with EF 30-35%, septal/apical akinesis, anterior hypokinesis, mild MR/AI.  - Echo (9/20): EF 45-50%, mild LVH, normal RV.  - Echo (8/21): EF 50%, normal RV 6. CKD 7. Atrial fibrillation: Chronic.  8. Chronic gastritis  SH: Married, lives in [place], prior tobacco smoker.  Has a farm, grows peaches. World War 2 veteran.   FH: CAD  ROS: All systems reviewed and negative except as per HPI  Current Outpatient Medications  Medication Sig Dispense Refill   ELIQUIS 2.5 MG TABS tablet TAKE (1) TABLET TWICE A DAY. 180 tablet 3   furosemide (LASIX) 20 MG tablet TAKE 1 TO 2 TABLETS DAILY AS DIRECTED (ALTERNATING DAYS) 120 tablet 2   metoprolol succinate (TOPROL XL) 25 MG 24 hr tablet Take 0.5 tablets (12.5 mg  total) by mouth daily. 60 tablet 5   pantoprazole (PROTONIX) 40 MG tablet TAKE 1 TABLET DAILY 90 tablet 3   simvastatin (ZOCOR) 40 MG tablet TAKE ONE TABLET DAILY AT 6PM 90 tablet 3   spironolactone (ALDACTONE) 25 MG tablet TAKE (1/2) TABLET DAILY. 45 tablet 3   vitamin B-12 (CYANOCOBALAMIN) 500 MCG tablet Take 1 tablet (500 mcg total) by mouth daily.     VITAMIN D, CHOLECALCIFEROL, PO Take 1,000 Units by mouth daily.     No current facility-administered medications for this encounter.   BP 100/60   Pulse 61   Wt 67.5 kg (148 lb 12.8 oz)    SpO2 94%   BMI 24.76 kg/m  General: NAD Neck: No JVD, no thyromegaly or thyroid nodule.  Lungs: Clear to auscultation bilaterally with normal respiratory effort. CV: Nondisplaced PMI.  Heart irregular S1/S2, no S3/S4, no murmur.  No peripheral edema.  No carotid bruit.  Normal pedal pulses.  Abdomen: Soft, nontender, no hepatosplenomegaly, no distention.  Skin: Intact without lesions or rashes.  Neurologic: Alert and oriented x 3.  Psych: Normal affect. Extremities: No clubbing or cyanosis.  HEENT: Normal.   Assessment/Plan: 1. CAD: No chest pain.    - No ASA given stable CAD and Eliquis use.  - Continue statin, good LDL in 2/24.  2. Chronic systolic CHF: Ischemic cardiomyopathy.  Now with Medtronic CRT-P device.  Echo in 8/21 with EF up to 50% with normal RV.  NYHA class 3 symptoms, mainly due to fatigue.  Not significantly volume overloaded on exam.   - Continue Lasix 40 daily alternating with 20 daily.  BMET/BNP today.  - Continue to wear graded compression stockings.  - Continue spironolactone 12.5 daily.  - With orthostatic symptoms, I will have him stop Coreg and start Toprol XL 12.5 at bedtime.  - Off lisinopril with soft BP.   3. Hyperlipidemia:  Continue Zocor, good lipids in 2/24.  4. Complete heart block: S/p MDT CRT-P device placement.  5. Atrial fibrillation: Chronic.   - Continue apixaban 2.5 mg bid (age, creatinine > 1.5).  CBC today.   Followup in 6 months.   Marca Ancona 09/01/2023

## 2023-09-25 ENCOUNTER — Ambulatory Visit (INDEPENDENT_AMBULATORY_CARE_PROVIDER_SITE_OTHER): Payer: PPO

## 2023-09-25 DIAGNOSIS — I442 Atrioventricular block, complete: Secondary | ICD-10-CM

## 2023-09-25 LAB — CUP PACEART REMOTE DEVICE CHECK
Battery Remaining Longevity: 3 mo
Battery Voltage: 2.64 V
Brady Statistic RA Percent Paced: 0.99 %
Brady Statistic RV Percent Paced: 97.45 %
Date Time Interrogation Session: 20241120020952
Implantable Lead Connection Status: 753985
Implantable Lead Connection Status: 753985
Implantable Lead Connection Status: 753985
Implantable Lead Implant Date: 20200217
Implantable Lead Implant Date: 20200217
Implantable Lead Implant Date: 20200217
Implantable Lead Location: 753858
Implantable Lead Location: 753859
Implantable Lead Location: 753860
Implantable Lead Model: 4398
Implantable Lead Model: 5076
Implantable Lead Model: 5076
Implantable Pulse Generator Implant Date: 20200217
Lead Channel Impedance Value: 266 Ohm
Lead Channel Impedance Value: 285 Ohm
Lead Channel Impedance Value: 342 Ohm
Lead Channel Impedance Value: 399 Ohm
Lead Channel Impedance Value: 418 Ohm
Lead Channel Impedance Value: 418 Ohm
Lead Channel Impedance Value: 437 Ohm
Lead Channel Impedance Value: 456 Ohm
Lead Channel Impedance Value: 513 Ohm
Lead Channel Impedance Value: 703 Ohm
Lead Channel Impedance Value: 722 Ohm
Lead Channel Impedance Value: 741 Ohm
Lead Channel Impedance Value: 760 Ohm
Lead Channel Impedance Value: 760 Ohm
Lead Channel Pacing Threshold Amplitude: 0.5 V
Lead Channel Pacing Threshold Amplitude: 1 V
Lead Channel Pacing Threshold Amplitude: 2 V
Lead Channel Pacing Threshold Pulse Width: 0.4 ms
Lead Channel Pacing Threshold Pulse Width: 0.4 ms
Lead Channel Pacing Threshold Pulse Width: 1 ms
Lead Channel Sensing Intrinsic Amplitude: 0.5 mV
Lead Channel Sensing Intrinsic Amplitude: 0.5 mV
Lead Channel Sensing Intrinsic Amplitude: 12 mV
Lead Channel Sensing Intrinsic Amplitude: 12 mV
Lead Channel Setting Pacing Amplitude: 2.5 V
Lead Channel Setting Pacing Amplitude: 2.5 V
Lead Channel Setting Pacing Amplitude: 4 V
Lead Channel Setting Pacing Pulse Width: 0.4 ms
Lead Channel Setting Pacing Pulse Width: 1 ms
Lead Channel Setting Sensing Sensitivity: 1.2 mV
Zone Setting Status: 755011
Zone Setting Status: 755011

## 2023-10-18 ENCOUNTER — Telehealth (HOSPITAL_COMMUNITY): Payer: Self-pay | Admitting: Cardiology

## 2023-10-18 NOTE — Telephone Encounter (Signed)
Eliquis samples requested  Medication Samples have been provided to the patient.  Drug name: eliquis       Strength: 2.5        Qty: 56  LOT: [id number]  Exp.Date: 11/2024  Dosing instructions: ONE TAB TWICE A DAY  The patient has been instructed regarding the correct time, dose, and frequency of taking this medication, including desired effects and most common side effects.   Magda Bernheim M 5:04 PM 10/18/2023

## 2023-10-21 ENCOUNTER — Telehealth: Payer: Self-pay | Admitting: Nurse Practitioner

## 2023-10-21 ENCOUNTER — Other Ambulatory Visit: Payer: Self-pay | Admitting: Nurse Practitioner

## 2023-10-21 DIAGNOSIS — I442 Atrioventricular block, complete: Secondary | ICD-10-CM

## 2023-10-21 MED ORDER — APIXABAN 2.5 MG PO TABS: 2.5000 mg | ORAL_TABLET | Freq: Two times a day (BID) | ORAL | 3 refills | Status: AC

## 2023-10-21 NOTE — Telephone Encounter (Signed)
    Pts granddaughter called this evening as pt is in the donut hole and about to run out of eliquis.  She asked if 2 days worth of eliquis could be sent to [facility] to get her by until she can pick up samples @ HF clinic.  Eliquis 2.5 mg 1 PO BID, #4, 3 refills sent to [facility].  Caller verbalized understanding and was grateful for the call back.  Nicolasa Ducking, NP 10/21/2023, 6:08 PM

## 2023-10-22 NOTE — Progress Notes (Signed)
Remote pacemaker transmission.   

## 2023-10-28 ENCOUNTER — Ambulatory Visit (INDEPENDENT_AMBULATORY_CARE_PROVIDER_SITE_OTHER): Payer: PPO

## 2023-10-28 DIAGNOSIS — I442 Atrioventricular block, complete: Secondary | ICD-10-CM

## 2023-10-28 LAB — CUP PACEART REMOTE DEVICE CHECK
Battery Remaining Longevity: 2 mo
Battery Voltage: 2.61 V
Brady Statistic RA Percent Paced: 1.15 %
Brady Statistic RV Percent Paced: 97.53 %
Date Time Interrogation Session: 20241222210818
Implantable Lead Connection Status: 753985
Implantable Lead Connection Status: 753985
Implantable Lead Connection Status: 753985
Implantable Lead Implant Date: 20200217
Implantable Lead Implant Date: 20200217
Implantable Lead Implant Date: 20200217
Implantable Lead Location: 753858
Implantable Lead Location: 753859
Implantable Lead Location: 753860
Implantable Lead Model: 4398
Implantable Lead Model: 5076
Implantable Lead Model: 5076
Implantable Pulse Generator Implant Date: 20200217
Lead Channel Impedance Value: 266 Ohm
Lead Channel Impedance Value: 285 Ohm
Lead Channel Impedance Value: 342 Ohm
Lead Channel Impedance Value: 418 Ohm
Lead Channel Impedance Value: 418 Ohm
Lead Channel Impedance Value: 437 Ohm
Lead Channel Impedance Value: 437 Ohm
Lead Channel Impedance Value: 456 Ohm
Lead Channel Impedance Value: 532 Ohm
Lead Channel Impedance Value: 741 Ohm
Lead Channel Impedance Value: 741 Ohm
Lead Channel Impedance Value: 760 Ohm
Lead Channel Impedance Value: 760 Ohm
Lead Channel Impedance Value: 779 Ohm
Lead Channel Pacing Threshold Amplitude: 0.5 V
Lead Channel Pacing Threshold Amplitude: 1.125 V
Lead Channel Pacing Threshold Amplitude: 2.375 V
Lead Channel Pacing Threshold Pulse Width: 0.4 ms
Lead Channel Pacing Threshold Pulse Width: 0.4 ms
Lead Channel Pacing Threshold Pulse Width: 1 ms
Lead Channel Sensing Intrinsic Amplitude: 0.5 mV
Lead Channel Sensing Intrinsic Amplitude: 0.5 mV
Lead Channel Sensing Intrinsic Amplitude: 14.875 mV
Lead Channel Sensing Intrinsic Amplitude: 14.875 mV
Lead Channel Setting Pacing Amplitude: 2.5 V
Lead Channel Setting Pacing Amplitude: 2.75 V
Lead Channel Setting Pacing Amplitude: 4.75 V
Lead Channel Setting Pacing Pulse Width: 0.4 ms
Lead Channel Setting Pacing Pulse Width: 1 ms
Lead Channel Setting Sensing Sensitivity: 1.2 mV
Zone Setting Status: 755011
Zone Setting Status: 755011

## 2023-11-09 DIAGNOSIS — U071 COVID-19: Secondary | ICD-10-CM | POA: Diagnosis not present

## 2023-11-09 DIAGNOSIS — Z20822 Contact with and (suspected) exposure to covid-19: Secondary | ICD-10-CM | POA: Diagnosis not present

## 2023-11-28 ENCOUNTER — Ambulatory Visit: Payer: PPO

## 2023-11-28 DIAGNOSIS — Z950 Presence of cardiac pacemaker: Secondary | ICD-10-CM | POA: Diagnosis not present

## 2023-11-28 DIAGNOSIS — I442 Atrioventricular block, complete: Secondary | ICD-10-CM

## 2023-11-28 DIAGNOSIS — I4891 Unspecified atrial fibrillation: Secondary | ICD-10-CM | POA: Diagnosis not present

## 2023-11-28 DIAGNOSIS — E785 Hyperlipidemia, unspecified: Secondary | ICD-10-CM | POA: Diagnosis not present

## 2023-11-28 DIAGNOSIS — R7301 Impaired fasting glucose: Secondary | ICD-10-CM | POA: Diagnosis not present

## 2023-11-28 DIAGNOSIS — R627 Adult failure to thrive: Secondary | ICD-10-CM | POA: Diagnosis not present

## 2023-11-28 DIAGNOSIS — Z0000 Encounter for general adult medical examination without abnormal findings: Secondary | ICD-10-CM | POA: Diagnosis not present

## 2023-11-28 DIAGNOSIS — I2510 Atherosclerotic heart disease of native coronary artery without angina pectoris: Secondary | ICD-10-CM | POA: Diagnosis not present

## 2023-11-28 DIAGNOSIS — I129 Hypertensive chronic kidney disease with stage 1 through stage 4 chronic kidney disease, or unspecified chronic kidney disease: Secondary | ICD-10-CM | POA: Diagnosis not present

## 2023-11-28 DIAGNOSIS — D649 Anemia, unspecified: Secondary | ICD-10-CM | POA: Diagnosis not present

## 2023-11-28 DIAGNOSIS — I255 Ischemic cardiomyopathy: Secondary | ICD-10-CM | POA: Diagnosis not present

## 2023-11-28 DIAGNOSIS — N1831 Chronic kidney disease, stage 3a: Secondary | ICD-10-CM | POA: Diagnosis not present

## 2023-11-28 DIAGNOSIS — K5900 Constipation, unspecified: Secondary | ICD-10-CM | POA: Diagnosis not present

## 2023-11-28 LAB — CUP PACEART REMOTE DEVICE CHECK
Battery Remaining Longevity: 1 mo
Battery Voltage: 2.6 V
Brady Statistic RA Percent Paced: 1.25 %
Brady Statistic RV Percent Paced: 98.63 %
Date Time Interrogation Session: 20250122231742
Implantable Lead Connection Status: 753985
Implantable Lead Connection Status: 753985
Implantable Lead Connection Status: 753985
Implantable Lead Implant Date: 20200217
Implantable Lead Implant Date: 20200217
Implantable Lead Implant Date: 20200217
Implantable Lead Location: 753858
Implantable Lead Location: 753859
Implantable Lead Location: 753860
Implantable Lead Model: 4398
Implantable Lead Model: 5076
Implantable Lead Model: 5076
Implantable Pulse Generator Implant Date: 20200217
Lead Channel Impedance Value: 285 Ohm
Lead Channel Impedance Value: 285 Ohm
Lead Channel Impedance Value: 342 Ohm
Lead Channel Impedance Value: 437 Ohm
Lead Channel Impedance Value: 437 Ohm
Lead Channel Impedance Value: 456 Ohm
Lead Channel Impedance Value: 456 Ohm
Lead Channel Impedance Value: 456 Ohm
Lead Channel Impedance Value: 570 Ohm
Lead Channel Impedance Value: 760 Ohm
Lead Channel Impedance Value: 779 Ohm
Lead Channel Impedance Value: 798 Ohm
Lead Channel Impedance Value: 798 Ohm
Lead Channel Impedance Value: 798 Ohm
Lead Channel Pacing Threshold Amplitude: 0.5 V
Lead Channel Pacing Threshold Amplitude: 1 V
Lead Channel Pacing Threshold Amplitude: 2.125 V
Lead Channel Pacing Threshold Pulse Width: 0.4 ms
Lead Channel Pacing Threshold Pulse Width: 0.4 ms
Lead Channel Pacing Threshold Pulse Width: 1 ms
Lead Channel Sensing Intrinsic Amplitude: 0.375 mV
Lead Channel Sensing Intrinsic Amplitude: 0.375 mV
Lead Channel Sensing Intrinsic Amplitude: 12 mV
Lead Channel Sensing Intrinsic Amplitude: 12 mV
Lead Channel Setting Pacing Amplitude: 2.5 V
Lead Channel Setting Pacing Amplitude: 2.5 V
Lead Channel Setting Pacing Amplitude: 4.25 V
Lead Channel Setting Pacing Pulse Width: 0.4 ms
Lead Channel Setting Pacing Pulse Width: 1 ms
Lead Channel Setting Sensing Sensitivity: 1.2 mV
Zone Setting Status: 755011
Zone Setting Status: 755011

## 2023-11-29 ENCOUNTER — Encounter: Payer: Self-pay | Admitting: Internal Medicine

## 2023-12-06 NOTE — Progress Notes (Signed)
 Remote pacemaker transmission.

## 2023-12-13 DIAGNOSIS — Z950 Presence of cardiac pacemaker: Secondary | ICD-10-CM | POA: Diagnosis not present

## 2023-12-13 DIAGNOSIS — I130 Hypertensive heart and chronic kidney disease with heart failure and stage 1 through stage 4 chronic kidney disease, or unspecified chronic kidney disease: Secondary | ICD-10-CM | POA: Diagnosis not present

## 2023-12-13 DIAGNOSIS — K219 Gastro-esophageal reflux disease without esophagitis: Secondary | ICD-10-CM | POA: Diagnosis not present

## 2023-12-13 DIAGNOSIS — E785 Hyperlipidemia, unspecified: Secondary | ICD-10-CM | POA: Diagnosis not present

## 2023-12-13 DIAGNOSIS — R7301 Impaired fasting glucose: Secondary | ICD-10-CM | POA: Diagnosis not present

## 2023-12-13 DIAGNOSIS — I4891 Unspecified atrial fibrillation: Secondary | ICD-10-CM | POA: Diagnosis not present

## 2023-12-13 DIAGNOSIS — I255 Ischemic cardiomyopathy: Secondary | ICD-10-CM | POA: Diagnosis not present

## 2023-12-27 ENCOUNTER — Telehealth: Payer: Self-pay | Admitting: Gastroenterology

## 2023-12-27 DIAGNOSIS — E785 Hyperlipidemia, unspecified: Secondary | ICD-10-CM | POA: Diagnosis not present

## 2023-12-27 DIAGNOSIS — Z1329 Encounter for screening for other suspected endocrine disorder: Secondary | ICD-10-CM | POA: Diagnosis not present

## 2023-12-27 DIAGNOSIS — Z131 Encounter for screening for diabetes mellitus: Secondary | ICD-10-CM | POA: Diagnosis not present

## 2023-12-27 DIAGNOSIS — I10 Essential (primary) hypertension: Secondary | ICD-10-CM | POA: Diagnosis not present

## 2023-12-27 NOTE — Telephone Encounter (Signed)
Patient's son left message at [facility]. About upcoming appt and would like a call back.  Laban Emperor - son  [phone]

## 2023-12-30 ENCOUNTER — Ambulatory Visit (INDEPENDENT_AMBULATORY_CARE_PROVIDER_SITE_OTHER): Payer: PPO | Admitting: Gastroenterology

## 2023-12-30 ENCOUNTER — Ambulatory Visit (INDEPENDENT_AMBULATORY_CARE_PROVIDER_SITE_OTHER): Payer: PPO

## 2023-12-30 DIAGNOSIS — I442 Atrioventricular block, complete: Secondary | ICD-10-CM | POA: Diagnosis not present

## 2023-12-30 LAB — CUP PACEART REMOTE DEVICE CHECK
Battery Remaining Longevity: 1 mo — CL
Battery Voltage: 2.6 V
Brady Statistic RA Percent Paced: 0.77 %
Brady Statistic RV Percent Paced: 98.84 %
Date Time Interrogation Session: 20250224055755
Implantable Lead Connection Status: 753985
Implantable Lead Connection Status: 753985
Implantable Lead Connection Status: 753985
Implantable Lead Implant Date: 20200217
Implantable Lead Implant Date: 20200217
Implantable Lead Implant Date: 20200217
Implantable Lead Location: 753858
Implantable Lead Location: 753859
Implantable Lead Location: 753860
Implantable Lead Model: 4398
Implantable Lead Model: 5076
Implantable Lead Model: 5076
Implantable Pulse Generator Implant Date: 20200217
Lead Channel Impedance Value: 285 Ohm
Lead Channel Impedance Value: 285 Ohm
Lead Channel Impedance Value: 342 Ohm
Lead Channel Impedance Value: 418 Ohm
Lead Channel Impedance Value: 437 Ohm
Lead Channel Impedance Value: 437 Ohm
Lead Channel Impedance Value: 456 Ohm
Lead Channel Impedance Value: 475 Ohm
Lead Channel Impedance Value: 532 Ohm
Lead Channel Impedance Value: 760 Ohm
Lead Channel Impedance Value: 779 Ohm
Lead Channel Impedance Value: 779 Ohm
Lead Channel Impedance Value: 798 Ohm
Lead Channel Impedance Value: 817 Ohm
Lead Channel Pacing Threshold Amplitude: 0.5 V
Lead Channel Pacing Threshold Amplitude: 1.5 V
Lead Channel Pacing Threshold Amplitude: 2 V
Lead Channel Pacing Threshold Pulse Width: 0.4 ms
Lead Channel Pacing Threshold Pulse Width: 0.4 ms
Lead Channel Pacing Threshold Pulse Width: 1 ms
Lead Channel Sensing Intrinsic Amplitude: 0.875 mV
Lead Channel Sensing Intrinsic Amplitude: 0.875 mV
Lead Channel Sensing Intrinsic Amplitude: 14.25 mV
Lead Channel Sensing Intrinsic Amplitude: 14.25 mV
Lead Channel Setting Pacing Amplitude: 2 V
Lead Channel Setting Pacing Amplitude: 2.5 V
Lead Channel Setting Pacing Amplitude: 4 V
Lead Channel Setting Pacing Pulse Width: 0.4 ms
Lead Channel Setting Pacing Pulse Width: 1 ms
Lead Channel Setting Sensing Sensitivity: 1.2 mV
Zone Setting Status: 755011
Zone Setting Status: 755011

## 2023-12-31 ENCOUNTER — Encounter: Payer: Self-pay | Admitting: Internal Medicine

## 2024-01-09 NOTE — Progress Notes (Signed)
 Remote pacemaker transmission.

## 2024-01-30 ENCOUNTER — Encounter: Payer: Self-pay | Admitting: Internal Medicine

## 2024-01-30 ENCOUNTER — Ambulatory Visit (INDEPENDENT_AMBULATORY_CARE_PROVIDER_SITE_OTHER): Payer: PPO

## 2024-01-30 DIAGNOSIS — I442 Atrioventricular block, complete: Secondary | ICD-10-CM

## 2024-01-30 LAB — CUP PACEART REMOTE DEVICE CHECK
Battery Remaining Longevity: 1 mo — CL
Battery Voltage: 2.6 V
Brady Statistic RA Percent Paced: 0.71 %
Brady Statistic RV Percent Paced: 98.7 %
Date Time Interrogation Session: 20250326235750
Implantable Lead Connection Status: 753985
Implantable Lead Connection Status: 753985
Implantable Lead Connection Status: 753985
Implantable Lead Implant Date: 20200217
Implantable Lead Implant Date: 20200217
Implantable Lead Implant Date: 20200217
Implantable Lead Location: 753858
Implantable Lead Location: 753859
Implantable Lead Location: 753860
Implantable Lead Model: 4398
Implantable Lead Model: 5076
Implantable Lead Model: 5076
Implantable Pulse Generator Implant Date: 20200217
Lead Channel Impedance Value: 285 Ohm
Lead Channel Impedance Value: 304 Ohm
Lead Channel Impedance Value: 342 Ohm
Lead Channel Impedance Value: 456 Ohm
Lead Channel Impedance Value: 456 Ohm
Lead Channel Impedance Value: 475 Ohm
Lead Channel Impedance Value: 513 Ohm
Lead Channel Impedance Value: 551 Ohm
Lead Channel Impedance Value: 570 Ohm
Lead Channel Impedance Value: 836 Ohm
Lead Channel Impedance Value: 836 Ohm
Lead Channel Impedance Value: 893 Ohm
Lead Channel Impedance Value: 912 Ohm
Lead Channel Impedance Value: 950 Ohm
Lead Channel Pacing Threshold Amplitude: 0.5 V
Lead Channel Pacing Threshold Amplitude: 1.625 V
Lead Channel Pacing Threshold Amplitude: 2.125 V
Lead Channel Pacing Threshold Pulse Width: 0.4 ms
Lead Channel Pacing Threshold Pulse Width: 0.4 ms
Lead Channel Pacing Threshold Pulse Width: 1 ms
Lead Channel Sensing Intrinsic Amplitude: 0.375 mV
Lead Channel Sensing Intrinsic Amplitude: 0.375 mV
Lead Channel Sensing Intrinsic Amplitude: 7.875 mV
Lead Channel Sensing Intrinsic Amplitude: 7.875 mV
Lead Channel Setting Pacing Amplitude: 2.5 V
Lead Channel Setting Pacing Amplitude: 2.75 V
Lead Channel Setting Pacing Amplitude: 4.25 V
Lead Channel Setting Pacing Pulse Width: 0.4 ms
Lead Channel Setting Pacing Pulse Width: 1 ms
Lead Channel Setting Sensing Sensitivity: 1.2 mV
Zone Setting Status: 755011
Zone Setting Status: 755011

## 2024-02-05 NOTE — Progress Notes (Signed)
 Remote pacemaker transmission.

## 2024-02-10 ENCOUNTER — Telehealth: Payer: Self-pay

## 2024-02-10 NOTE — Telephone Encounter (Signed)
 Alert remote transmission:  RRT reached 4/5 - route to triage  Follow up as scheduled. LA, CVRS  Hasn't had IOV w/ GT since 07/2021. Needs to reestablish. Spoke w/ Son, [name]. Advised that EP scheduling would be calling to reestablish w/ GT and talk about gen change.

## 2024-02-21 ENCOUNTER — Ambulatory Visit: Attending: Cardiology | Admitting: Internal Medicine

## 2024-02-21 ENCOUNTER — Encounter: Payer: Self-pay | Admitting: Internal Medicine

## 2024-02-21 VITALS — BP 118/62 | HR 74 | Ht 66.0 in | Wt 148.0 lb

## 2024-02-21 DIAGNOSIS — I442 Atrioventricular block, complete: Secondary | ICD-10-CM | POA: Diagnosis not present

## 2024-02-21 LAB — CUP PACEART INCLINIC DEVICE CHECK
Date Time Interrogation Session: 20250418162958
Implantable Lead Connection Status: 753985
Implantable Lead Connection Status: 753985
Implantable Lead Connection Status: 753985
Implantable Lead Implant Date: 20200217
Implantable Lead Implant Date: 20200217
Implantable Lead Implant Date: 20200217
Implantable Lead Location: 753858
Implantable Lead Location: 753859
Implantable Lead Location: 753860
Implantable Lead Model: 4398
Implantable Lead Model: 5076
Implantable Lead Model: 5076
Implantable Pulse Generator Implant Date: 20200217

## 2024-02-21 NOTE — Progress Notes (Signed)
 HPI Mark Lane returns today for ongoing evaluation and management of CHB, CAD, and atrial fib. He is s/p biv PPM insertion due to all of the above and advanced age. He has done well in the interim. He has class 2 symptoms. He does not have palpitations. No syncope. He has 1+ edema. He has reached ERI on his device.  No Known Allergies   Current Outpatient Medications  Medication Sig Dispense Refill   apixaban  (ELIQUIS ) 2.5 MG TABS tablet Take 1 tablet (2.5 mg total) by mouth 2 (two) times daily. 4 tablet 3   furosemide  (LASIX ) 20 MG tablet TAKE 1 TO 2 TABLETS DAILY AS DIRECTED (ALTERNATING DAYS) 120 tablet 2   metoprolol  succinate (TOPROL  XL) 25 MG 24 hr tablet Take 0.5 tablets (12.5 mg total) by mouth daily. 60 tablet 5   pantoprazole  (PROTONIX ) 40 MG tablet TAKE 1 TABLET DAILY 90 tablet 3   simvastatin  (ZOCOR ) 40 MG tablet TAKE ONE TABLET DAILY AT 6PM 90 tablet 3   spironolactone  (ALDACTONE ) 25 MG tablet TAKE (1/2) TABLET DAILY. 45 tablet 3   vitamin B-12 (CYANOCOBALAMIN ) 500 MCG tablet Take 1 tablet (500 mcg total) by mouth daily.     VITAMIN D , CHOLECALCIFEROL , PO Take 1,000 Units by mouth daily.     No current facility-administered medications for this visit.     Past Medical History:  Diagnosis Date   CHF (congestive heart failure) (HCC)    Coronary artery disease    History of kidney stones    years ago   Hyperlipidemia     ROS:   All systems reviewed and negative except as noted in the HPI.   Past Surgical History:  Procedure Laterality Date   BIOPSY  07/05/2020   Procedure: BIOPSY;  Surgeon: Urban Garden, MD;  [facility];  Service: Gastroenterology;;   BIV PACEMAKER INSERTION CRT-P N/A 12/22/2018   Procedure: BIV PACEMAKER INSERTION CRT-P;  Surgeon: Tammie Fall, MD;  Location: [facility] INVASIVE CV LAB;  Service: Cardiovascular;  Laterality: N/A;   CARDIAC CATHETERIZATION     ESOPHAGOGASTRODUODENOSCOPY (EGD) WITH PROPOFOL  N/A 07/05/2020    Procedure: ESOPHAGOGASTRODUODENOSCOPY (EGD) WITH PROPOFOL ;  Surgeon: Urban Garden, MD;  [facility];  Service: Gastroenterology;  Laterality: N/A;   HEMORRHOID SURGERY     removed a hemorrhoid in the 1970s   KIDNEY STONE SURGERY     remove a kidney stone     Family History  Problem Relation Age of Onset   Heart attack Father        in his 74's   Cancer Brother        1/2   Cancer Sister    Heart Problems Brother 51       pacemaker 2/2     Social History   Socioeconomic History   Marital status: Married    Spouse name: Not on file   Number of children: Not on file   Years of education: Not on file   Highest education level: Not on file  Occupational History   Not on file  Tobacco Use   Smoking status: Former    Passive exposure: Past   Smokeless tobacco: Never  Substance and Sexual Activity   Alcohol use: No   Drug use: No   Sexual activity: Not on file  Other Topics Concern   Not on file  Social History Narrative   He is married, has 1-son. He does not smoke, or use alcohol  Social Drivers of Corporate investment banker Strain: Low Risk  (11/02/2022)   Received from [facility]   Overall Financial Resource Strain (CARDIA)    Difficulty of Paying Living Expenses: Not hard at all  Food Insecurity: No Food Insecurity (11/02/2022)   Received from [facility], [facility]   Hunger Vital Sign    Worried About Running Out of Food in the Last Year: Never true    Ran Out of Food in the Last Year: Never true  Transportation Needs: No Transportation Needs (11/02/2022)   [facility]   [facility] - Transportation    Lack of Transportation (Medical): No    Lack of Transportation (Non-Medical): No  Physical Activity: Not on file  Stress: Not on file  Social Connections: Not on file  Intimate Partner Violence: Not on file     BP 118/62   Pulse 74   Ht 5\' 6"  (1.676 m)   Wt 148  lb (67.1 kg)   SpO2 96%   BMI 23.89 kg/m   Physical Exam:  Well appearing NAD HEENT: Unremarkable Neck:  No JVD, no thyromegally Lymphatics:  No adenopathy Back:  No CVA tenderness Lungs:  Clear w/ no wheezes HEART:  Regular rate rhythm, no murmurs, no rubs, no clicks Abd:  soft, positive bowel sounds, no organomegally, no rebound, no guarding Ext:  2 plus pulses, no edema, no cyanosis, no clubbing Skin:  No rashes no nodules Neuro:  CN II through XII intact, motor grossly intact  DEVICE  Normal device function.  See PaceArt for details. ERI.  Assess/Plan: CHB - he is doing well s/p PPM. PPM - he has reached ERI. He will be scheduled to undergo PPM gen change out in the coming weeks. AFib - He will hold his low dose eliquis  2 days before the procedure and 5 days after.  Chronic systolic heart failure - his symptoms are class 2A. No change in his meds.

## 2024-02-21 NOTE — Patient Instructions (Addendum)
 Medication Instructions:  Your physician recommends that you continue on your current medications as directed. Please refer to the Current Medication list given to you today.  *If you need a refill on your cardiac medications before your next appointment, please call your pharmacy*  Lab Work: CBC and Bmet within 30 days of procedure  You may go to any [facility] Location for your lab work:  [facility] - [address] ([facility]) - [address] [phone] [address]  [place] - [address]   [place]  - [address]   [place] - [address] - [address] [place]  - [address] [place] [address] ([facility]   If you have labs (blood work) drawn today and your tests are completely normal, you will receive your results only by: [employer] (if you have MyChart)  If you have any lab test that is abnormal or we need to change your treatment, we will call you or send a MyChart message to review the results.  Testing/Procedures: Will call with dates  Follow-Up: At [facility], you and your health needs are our priority.  As part of our continuing mission to provide you with exceptional heart care, we have created designated Provider Care Teams.  These Care Teams include your primary Cardiologist (physician) and Advanced Practice Providers (APPs -  Physician Assistants and Nurse Practitioners) who all work together to provide you with the care you need, when you need it.  Your next appointment:   To be scheduled  The format for your next appointment:   In Person  Provider:   Danelle Birmingham, MD{or one of the following Advanced Practice Providers on your designated Care Team:   Charlies Arthur, [place] Ozell Jodie Passey, [place] Leotis Barrack, NP  Note: Remote monitoring is used to monitor your Pacemaker/ ICD from home. This monitoring reduces the number  of office visits required to check your device to one time per year. It allows us  to keep an eye on the functioning of your device to ensure it is working properly.            Valet parking services will be available as well.

## 2024-03-02 ENCOUNTER — Ambulatory Visit (INDEPENDENT_AMBULATORY_CARE_PROVIDER_SITE_OTHER): Payer: PPO

## 2024-03-02 DIAGNOSIS — I442 Atrioventricular block, complete: Secondary | ICD-10-CM

## 2024-03-02 LAB — CUP PACEART REMOTE DEVICE CHECK
Battery Remaining Longevity: 1 mo — CL
Battery Voltage: 2.59 V
Brady Statistic RA Percent Paced: 0 %
Brady Statistic RV Percent Paced: 99.45 %
Date Time Interrogation Session: 20250428054044
Implantable Lead Connection Status: 753985
Implantable Lead Connection Status: 753985
Implantable Lead Connection Status: 753985
Implantable Lead Implant Date: 20200217
Implantable Lead Implant Date: 20200217
Implantable Lead Implant Date: 20200217
Implantable Lead Location: 753858
Implantable Lead Location: 753859
Implantable Lead Location: 753860
Implantable Lead Model: 4398
Implantable Lead Model: 5076
Implantable Lead Model: 5076
Implantable Pulse Generator Implant Date: 20200217
Lead Channel Impedance Value: 266 Ohm
Lead Channel Impedance Value: 285 Ohm
Lead Channel Impedance Value: 342 Ohm
Lead Channel Impedance Value: 437 Ohm
Lead Channel Impedance Value: 456 Ohm
Lead Channel Impedance Value: 456 Ohm
Lead Channel Impedance Value: 475 Ohm
Lead Channel Impedance Value: 475 Ohm
Lead Channel Impedance Value: 570 Ohm
Lead Channel Impedance Value: 779 Ohm
Lead Channel Impedance Value: 798 Ohm
Lead Channel Impedance Value: 817 Ohm
Lead Channel Impedance Value: 817 Ohm
Lead Channel Impedance Value: 836 Ohm
Lead Channel Pacing Threshold Amplitude: 0.5 V
Lead Channel Pacing Threshold Amplitude: 1.625 V
Lead Channel Pacing Threshold Amplitude: 2.5 V
Lead Channel Pacing Threshold Pulse Width: 0.4 ms
Lead Channel Pacing Threshold Pulse Width: 0.4 ms
Lead Channel Pacing Threshold Pulse Width: 1 ms
Lead Channel Sensing Intrinsic Amplitude: 0.5 mV
Lead Channel Sensing Intrinsic Amplitude: 0.5 mV
Lead Channel Sensing Intrinsic Amplitude: 13.75 mV
Lead Channel Sensing Intrinsic Amplitude: 15.625 mV
Lead Channel Setting Pacing Amplitude: 2.75 V
Lead Channel Setting Pacing Amplitude: 4 V
Lead Channel Setting Pacing Pulse Width: 0.4 ms
Lead Channel Setting Pacing Pulse Width: 1 ms
Lead Channel Setting Sensing Sensitivity: 1.2 mV
Zone Setting Status: 755011
Zone Setting Status: 755011

## 2024-03-03 ENCOUNTER — Encounter: Payer: Self-pay | Admitting: Internal Medicine

## 2024-03-11 NOTE — Progress Notes (Signed)
 Remote pacemaker transmission.

## 2024-03-12 ENCOUNTER — Other Ambulatory Visit: Payer: Self-pay

## 2024-03-12 DIAGNOSIS — I5022 Chronic systolic (congestive) heart failure: Secondary | ICD-10-CM

## 2024-03-12 DIAGNOSIS — I442 Atrioventricular block, complete: Secondary | ICD-10-CM

## 2024-03-12 DIAGNOSIS — I440 Atrioventricular block, first degree: Secondary | ICD-10-CM

## 2024-03-12 DIAGNOSIS — Z01812 Encounter for preprocedural laboratory examination: Secondary | ICD-10-CM

## 2024-03-12 NOTE — Progress Notes (Signed)
 Spoke with [name]. Pt set up for PPM-C. 2 copies of letter and bottle of scrub place at front.

## 2024-03-22 DIAGNOSIS — R300 Dysuria: Secondary | ICD-10-CM | POA: Diagnosis not present

## 2024-03-25 ENCOUNTER — Other Ambulatory Visit: Payer: Self-pay

## 2024-03-25 DIAGNOSIS — I5022 Chronic systolic (congestive) heart failure: Secondary | ICD-10-CM

## 2024-03-25 DIAGNOSIS — I442 Atrioventricular block, complete: Secondary | ICD-10-CM

## 2024-03-25 DIAGNOSIS — I440 Atrioventricular block, first degree: Secondary | ICD-10-CM

## 2024-03-25 DIAGNOSIS — Z01812 Encounter for preprocedural laboratory examination: Secondary | ICD-10-CM

## 2024-03-26 ENCOUNTER — Telehealth (HOSPITAL_COMMUNITY): Payer: Self-pay

## 2024-03-26 NOTE — Telephone Encounter (Signed)
 Spoke with patient's son Mark Lane Age to complete pre-procedure call. Reports patient is a resident of [facility].      New medical conditions?  No Recent hospitalizations or surgeries? No Started any new medications? No Patient made aware to contact office to inform of any new medications started. Any changes in activities of daily living? No  Pre-procedure testing scheduled: lab work ordered and will be drawn at facility.   Patient will need to hold Eliquis  3 days prior to procedure- last dose on June 15.  Confirmed patient is scheduled for PPM generator change on Thursday, June 19 with Dr. Manya Sells. Instructed patient to arrive at the Main Entrance A at [facility]: [address] [place], [place] [address] and check in at Admitting at 5:30 AM.  Advised of plan to go home the same day and will only stay overnight if medically necessary. You MUST have a responsible adult to drive you home and MUST be with you the first 24 hours after you arrive home or your procedure could be cancelled.  Mark Lane verbalized understanding to information provided and is agreeable to proceed with procedure.

## 2024-03-27 DIAGNOSIS — I10 Essential (primary) hypertension: Secondary | ICD-10-CM | POA: Diagnosis not present

## 2024-03-30 LAB — LAB REPORT - SCANNED: EGFR: 33.1

## 2024-04-02 ENCOUNTER — Ambulatory Visit (INDEPENDENT_AMBULATORY_CARE_PROVIDER_SITE_OTHER): Payer: PPO

## 2024-04-02 DIAGNOSIS — I440 Atrioventricular block, first degree: Secondary | ICD-10-CM | POA: Diagnosis not present

## 2024-04-02 LAB — CUP PACEART REMOTE DEVICE CHECK
Battery Remaining Longevity: 1 mo — CL
Battery Voltage: 2.59 V
Brady Statistic RA Percent Paced: 0 %
Brady Statistic RV Percent Paced: 99.79 %
Date Time Interrogation Session: 20250529032307
Implantable Lead Connection Status: 753985
Implantable Lead Connection Status: 753985
Implantable Lead Connection Status: 753985
Implantable Lead Implant Date: 20200217
Implantable Lead Implant Date: 20200217
Implantable Lead Implant Date: 20200217
Implantable Lead Location: 753858
Implantable Lead Location: 753859
Implantable Lead Location: 753860
Implantable Lead Model: 4398
Implantable Lead Model: 5076
Implantable Lead Model: 5076
Implantable Pulse Generator Implant Date: 20200217
Lead Channel Impedance Value: 285 Ohm
Lead Channel Impedance Value: 323 Ohm
Lead Channel Impedance Value: 342 Ohm
Lead Channel Impedance Value: 418 Ohm
Lead Channel Impedance Value: 418 Ohm
Lead Channel Impedance Value: 475 Ohm
Lead Channel Impedance Value: 494 Ohm
Lead Channel Impedance Value: 494 Ohm
Lead Channel Impedance Value: 608 Ohm
Lead Channel Impedance Value: 741 Ohm
Lead Channel Impedance Value: 798 Ohm
Lead Channel Impedance Value: 817 Ohm
Lead Channel Impedance Value: 817 Ohm
Lead Channel Impedance Value: 874 Ohm
Lead Channel Pacing Threshold Amplitude: 0.5 V
Lead Channel Pacing Threshold Amplitude: 1.5 V
Lead Channel Pacing Threshold Amplitude: 2.5 V
Lead Channel Pacing Threshold Pulse Width: 0.4 ms
Lead Channel Pacing Threshold Pulse Width: 0.4 ms
Lead Channel Pacing Threshold Pulse Width: 1 ms
Lead Channel Sensing Intrinsic Amplitude: 0.625 mV
Lead Channel Sensing Intrinsic Amplitude: 0.625 mV
Lead Channel Sensing Intrinsic Amplitude: 8.5 mV
Lead Channel Sensing Intrinsic Amplitude: 8.5 mV
Lead Channel Setting Pacing Amplitude: 2.5 V
Lead Channel Setting Pacing Amplitude: 4 V
Lead Channel Setting Pacing Pulse Width: 0.4 ms
Lead Channel Setting Pacing Pulse Width: 1 ms
Lead Channel Setting Sensing Sensitivity: 1.2 mV
Zone Setting Status: 755011
Zone Setting Status: 755011

## 2024-04-09 ENCOUNTER — Ambulatory Visit: Payer: Self-pay | Admitting: Internal Medicine

## 2024-04-11 DIAGNOSIS — L0390 Cellulitis, unspecified: Secondary | ICD-10-CM | POA: Diagnosis not present

## 2024-04-11 DIAGNOSIS — Z6824 Body mass index (BMI) 24.0-24.9, adult: Secondary | ICD-10-CM | POA: Diagnosis not present

## 2024-04-13 ENCOUNTER — Telehealth: Payer: Self-pay | Admitting: Internal Medicine

## 2024-04-13 NOTE — Telephone Encounter (Signed)
 Patient's son/POA is calling stating the patient has cellulitis and is on antibiotics. He reports he will be finished with the medication on 06/13, but was unsure if this would effect the 06/19 procedure scheduled. Please advise.

## 2024-04-15 ENCOUNTER — Telehealth (HOSPITAL_COMMUNITY): Payer: Self-pay

## 2024-04-15 NOTE — Telephone Encounter (Signed)
 See telephone note on 04/15/24.

## 2024-04-15 NOTE — Telephone Encounter (Signed)
 Attempted to reach [facility], LPN at [facility] to discuss upcoming procedure, no answer. Left VM to return call.

## 2024-04-15 NOTE — Telephone Encounter (Signed)
 Spoke with patient's son, Mark Lane to discuss upcoming procedure.   Confirmed patient is scheduled for a PPM generator change on Thursday, June 19 with Dr. Manya Sells. Instructed patient to arrive at the Main Entrance A at [facility]: [address] [place], [place] [address] and check in at Admitting at 5:30 AM.   Labs completed -scanned into media.  Any recent signs of acute illness or been started on antibiotics?  Patient went to the ED on June 7 to evaluate  red, itchy and swollen forehead with a non-draining open area. Patient was diagnosed with cellulitis and given Doxycycline for 7 days and hydrocortisone cream. Reports the open area has closed with redness and swelling improving. Will make Dr. Carolynne Citron aware.  Any medications to hold? Hold Eliquis  for 3 days prior to procedure- last dose June 15. Medication instructions:  On the morning of your procedure DO NOT take any medication. No eating or drinking after midnight prior to procedure.   The night before your procedure and the morning of your procedure, wash thoroughly with the CHG surgical soap from the neck down, paying special attention to the area where your procedure will be performed.  Advised of plan to go home the same day and will only stay overnight if medically necessary. You MUST have a responsible adult to drive you home and MUST be with you the first 24 hours after you arrive home. Patient plans to return back to [facility].   Mark Lane verbalized understanding to all instructions provided and agreed to proceed with procedure.

## 2024-04-17 ENCOUNTER — Encounter: Payer: Self-pay | Admitting: Emergency Medicine

## 2024-04-21 NOTE — Telephone Encounter (Signed)
 Spoke to [employer] who states his Father is feeling well. Confirmed we would continue with procedure as scheduled.

## 2024-04-23 ENCOUNTER — Encounter (HOSPITAL_COMMUNITY): Payer: Self-pay | Admitting: Internal Medicine

## 2024-04-23 ENCOUNTER — Encounter (HOSPITAL_COMMUNITY)
Admission: RE | Disposition: A | Discharge status: Home / Self Care | Payer: Self-pay | Source: Home / Self Care | Attending: Internal Medicine

## 2024-04-23 ENCOUNTER — Ambulatory Visit (HOSPITAL_COMMUNITY)
Admission: RE | Admit: 2024-04-23 | Discharge: 2024-04-23 | Disposition: A | Discharge status: Home / Self Care | Attending: Internal Medicine | Admitting: Internal Medicine

## 2024-04-23 ENCOUNTER — Other Ambulatory Visit: Payer: Self-pay

## 2024-04-23 DIAGNOSIS — Z7901 Long term (current) use of anticoagulants: Secondary | ICD-10-CM | POA: Insufficient documentation

## 2024-04-23 DIAGNOSIS — I2510 Atherosclerotic heart disease of native coronary artery without angina pectoris: Secondary | ICD-10-CM | POA: Diagnosis not present

## 2024-04-23 DIAGNOSIS — I4891 Unspecified atrial fibrillation: Secondary | ICD-10-CM | POA: Diagnosis not present

## 2024-04-23 DIAGNOSIS — I442 Atrioventricular block, complete: Secondary | ICD-10-CM | POA: Diagnosis not present

## 2024-04-23 DIAGNOSIS — I5022 Chronic systolic (congestive) heart failure: Secondary | ICD-10-CM | POA: Diagnosis not present

## 2024-04-23 DIAGNOSIS — Z45010 Encounter for checking and testing of cardiac pacemaker pulse generator [battery]: Secondary | ICD-10-CM

## 2024-04-23 DIAGNOSIS — Z79899 Other long term (current) drug therapy: Secondary | ICD-10-CM | POA: Diagnosis not present

## 2024-04-23 SURGERY — BIV PACEMAKER GENERATOR CHANGEOUT

## 2024-04-23 MED ORDER — CHLORHEXIDINE GLUCONATE 4 % EX SOLN
4.0000 | Freq: Once | CUTANEOUS | Status: DC
  Filled 2024-04-23: qty 60

## 2024-04-23 MED ORDER — POVIDONE-IODINE 10 % EX SWAB
2.0000 | Freq: Once | CUTANEOUS | Status: AC
  Administered 2024-04-23: 2 via TOPICAL

## 2024-04-23 MED ORDER — SODIUM CHLORIDE 0.9 % IV SOLN
80.0000 mg | INTRAVENOUS | Status: AC
  Administered 2024-04-23: 80 mg

## 2024-04-23 MED ORDER — LIDOCAINE HCL (PF) 1 % IJ SOLN
INTRAMUSCULAR | Status: AC
Start: 2024-04-23 — End: 2024-04-23
  Filled 2024-04-23: qty 30

## 2024-04-23 MED ORDER — LIDOCAINE HCL (PF) 1 % IJ SOLN
INTRAMUSCULAR | Status: AC
  Filled 2024-04-23: qty 30

## 2024-04-23 MED ORDER — CEFAZOLIN SODIUM-DEXTROSE 2-4 GM/100ML-% IV SOLN
INTRAVENOUS | Status: AC
  Filled 2024-04-23: qty 100

## 2024-04-23 MED ORDER — LIDOCAINE HCL (PF) 1 % IJ SOLN
INTRAMUSCULAR | Status: DC | PRN
  Administered 2024-04-23: 60 mL

## 2024-04-23 MED ORDER — CEFAZOLIN SODIUM-DEXTROSE 2-4 GM/100ML-% IV SOLN
2.0000 g | INTRAVENOUS | Status: AC
  Administered 2024-04-23: 2 g via INTRAVENOUS

## 2024-04-23 MED ORDER — ACETAMINOPHEN 325 MG PO TABS: 325.0000 mg | ORAL_TABLET | ORAL | Status: DC | PRN

## 2024-04-23 MED ORDER — ONDANSETRON HCL 4 MG/2ML IJ SOLN: 4.0000 mg | Freq: Four times a day (QID) | INTRAMUSCULAR | Status: DC | PRN

## 2024-04-23 MED ORDER — SODIUM CHLORIDE 0.9 % IV SOLN: INTRAVENOUS | Status: DC

## 2024-04-23 MED ORDER — SODIUM CHLORIDE 0.9 % IV SOLN
INTRAVENOUS | Status: AC
  Filled 2024-04-23: qty 2

## 2024-04-23 SURGICAL SUPPLY — 6 items
CABLE SURGICAL S-101-97-12 (CABLE) ×2 IMPLANT
DEVICE CRTP PERCEPTA QUAD MRI (Pacemaker) IMPLANT
PAD DEFIB RADIO PHYSIO CONN (PAD) ×2 IMPLANT
POUCH AIGIS-R ANTIBACT PPM (Mesh General) ×1 IMPLANT
POUCH AIGIS-R ANTIBACT PPM MED (Mesh General) IMPLANT
TRAY PACEMAKER INSERTION (PACKS) ×2 IMPLANT

## 2024-04-23 NOTE — Progress Notes (Signed)
 Remote pacemaker transmission.

## 2024-04-23 NOTE — H&P (Signed)
 HPI Mark Lane returns today for ongoing evaluation and management of CHB, CAD, and atrial fib. He is s/p biv PPM insertion due to all of the above and advanced age. He has done well in the interim. He has class 2 symptoms. He does not have palpitations. No syncope. He has 1+ edema. He has reached ERI on his device.  Allergies  No Known Allergies             Current Outpatient Medications  Medication Sig Dispense Refill   apixaban  (ELIQUIS ) 2.5 MG TABS tablet Take 1 tablet (2.5 mg total) by mouth 2 (two) times daily. 4 tablet 3   furosemide  (LASIX ) 20 MG tablet TAKE 1 TO 2 TABLETS DAILY AS DIRECTED (ALTERNATING DAYS) 120 tablet 2   metoprolol  succinate (TOPROL  XL) 25 MG 24 hr tablet Take 0.5 tablets (12.5 mg total) by mouth daily. 60 tablet 5   pantoprazole  (PROTONIX ) 40 MG tablet TAKE 1 TABLET DAILY 90 tablet 3   simvastatin  (ZOCOR ) 40 MG tablet TAKE ONE TABLET DAILY AT 6PM 90 tablet 3   spironolactone  (ALDACTONE ) 25 MG tablet TAKE (1/2) TABLET DAILY. 45 tablet 3   vitamin B-12 (CYANOCOBALAMIN ) 500 MCG tablet Take 1 tablet (500 mcg total) by mouth daily.       VITAMIN D , CHOLECALCIFEROL , PO Take 1,000 Units by mouth daily.          No current facility-administered medications for this visit.              Past Medical History:  Diagnosis Date   CHF (congestive heart failure) (HCC)     Coronary artery disease     History of kidney stones      years ago   Hyperlipidemia            ROS:    All systems reviewed and negative except as noted in the HPI.          Past Surgical History:  Procedure Laterality Date   BIOPSY   07/05/2020    Procedure: BIOPSY;  Surgeon: Urban Garden, MD;  [facility];  Service: Gastroenterology;;   BIV PACEMAKER INSERTION CRT-P N/A 12/22/2018    Procedure: BIV PACEMAKER INSERTION CRT-P;  Surgeon: Tammie Fall, MD;  Location: [facility] INVASIVE CV LAB;  Service: Cardiovascular;  Laterality: N/A;   CARDIAC  CATHETERIZATION       ESOPHAGOGASTRODUODENOSCOPY (EGD) WITH PROPOFOL  N/A 07/05/2020    Procedure: ESOPHAGOGASTRODUODENOSCOPY (EGD) WITH PROPOFOL ;  Surgeon: Urban Garden, MD;  [facility];  Service: Gastroenterology;  Laterality: N/A;   HEMORRHOID SURGERY        removed a hemorrhoid in the 1970s   KIDNEY STONE SURGERY        remove a kidney stone                 Family History  Problem Relation Age of Onset   Heart attack Father          in his 57's   Cancer Brother          1/2   Cancer Sister     Heart Problems Brother 3        pacemaker 2/2            Social History         Socioeconomic History   Marital status: Married      Spouse name: Not on file   Number of children:  Not on file   Years of education: Not on file   Highest education level: Not on file  Occupational History   Not on file  Tobacco Use   Smoking status: Former      Passive exposure: Past   Smokeless tobacco: Never  Substance and Sexual Activity   Alcohol use: No   Drug use: No   Sexual activity: Not on file  Other Topics Concern   Not on file  Social History Narrative    He is married, has 1-son. He does not smoke, or use alcohol    Social Drivers of Acupuncturist Strain: Low Risk  (11/02/2022)    Received from [facility], [facility]    Overall Financial Resource Strain (CARDIA)     Difficulty of Paying Living Expenses: Not hard at all  Food Insecurity: No Food Insecurity (11/02/2022)    Received from [facility], [facility]    Hunger Vital Sign     Worried About Running Out of Food in the Last Year: Never true     Ran Out of Food in the Last Year: Never true  Transportation Needs: No Transportation Needs (11/02/2022)    Received from [facility], [facility]    [facility] - Transportation     Lack of Transportation (Medical): No     Lack of Transportation (Non-Medical): No  Physical Activity: Not on file   Stress: Not on file  Social Connections: Not on file  Intimate Partner Violence: Not on file        BP 118/62   Pulse 74   Ht 5' 6 (1.676 m)   Wt 148 lb (67.1 kg)   SpO2 96%   BMI 23.89 kg/m    Physical Exam:   Well appearing NAD HEENT: Unremarkable Neck:  No JVD, no thyromegally Lymphatics:  No adenopathy Back:  No CVA tenderness Lungs:  Clear w/ no wheezes HEART:  Regular rate rhythm, no murmurs, no rubs, no clicks Abd:  soft, positive bowel sounds, no organomegally, no rebound, no guarding Ext:  2 plus pulses, no edema, no cyanosis, no clubbing Skin:  No rashes no nodules Neuro:  CN II through XII intact, motor grossly intact   DEVICE  Normal device function.  See PaceArt for details. ERI.   Assess/Plan: CHB - he is doing well s/p PPM. PPM - he has reached ERI. He will be scheduled to undergo PPM gen change out in the coming weeks. AFib - He will hold his low dose eliquis  2 days before the procedure and 5 days after.  Chronic systolic heart failure - his symptoms are class 2A. No change in his meds.

## 2024-04-23 NOTE — Discharge Instructions (Addendum)

## 2024-05-06 ENCOUNTER — Ambulatory Visit: Attending: Cardiology

## 2024-05-06 DIAGNOSIS — I442 Atrioventricular block, complete: Secondary | ICD-10-CM | POA: Diagnosis not present

## 2024-05-06 LAB — CUP PACEART INCLINIC DEVICE CHECK
Battery Remaining Longevity: 78 mo
Battery Voltage: 3.2 V
Brady Statistic AP VP Percent: 0 %
Brady Statistic AP VS Percent: 0 %
Brady Statistic AS VP Percent: 99.56 %
Brady Statistic AS VS Percent: 0.44 %
Brady Statistic RA Percent Paced: 0 %
Brady Statistic RV Percent Paced: 99.55 %
Date Time Interrogation Session: 20250702144918
Implantable Lead Connection Status: 753985
Implantable Lead Connection Status: 753985
Implantable Lead Connection Status: 753985
Implantable Lead Implant Date: 20200217
Implantable Lead Implant Date: 20200217
Implantable Lead Implant Date: 20200217
Implantable Lead Location: 753858
Implantable Lead Location: 753859
Implantable Lead Location: 753860
Implantable Lead Model: 4398
Implantable Lead Model: 5076
Implantable Lead Model: 5076
Implantable Pulse Generator Implant Date: 20250619
Lead Channel Impedance Value: 285 Ohm
Lead Channel Impedance Value: 323 Ohm
Lead Channel Impedance Value: 323 Ohm
Lead Channel Impedance Value: 380 Ohm
Lead Channel Impedance Value: 456 Ohm
Lead Channel Impedance Value: 475 Ohm
Lead Channel Impedance Value: 532 Ohm
Lead Channel Impedance Value: 551 Ohm
Lead Channel Impedance Value: 627 Ohm
Lead Channel Impedance Value: 684 Ohm
Lead Channel Impedance Value: 703 Ohm
Lead Channel Impedance Value: 722 Ohm
Lead Channel Impedance Value: 836 Ohm
Lead Channel Impedance Value: 855 Ohm
Lead Channel Pacing Threshold Amplitude: 1.375 V
Lead Channel Pacing Threshold Amplitude: 2.25 V
Lead Channel Pacing Threshold Pulse Width: 0.4 ms
Lead Channel Pacing Threshold Pulse Width: 1 ms
Lead Channel Sensing Intrinsic Amplitude: 0.625 mV
Lead Channel Setting Pacing Amplitude: 2.5 V
Lead Channel Setting Pacing Amplitude: 3 V
Lead Channel Setting Pacing Pulse Width: 0.8 ms
Lead Channel Setting Pacing Pulse Width: 1 ms
Lead Channel Setting Sensing Sensitivity: 1.2 mV
Zone Setting Status: 755011

## 2024-05-06 NOTE — Patient Instructions (Signed)

## 2024-05-06 NOTE — Progress Notes (Signed)
 Normal multi-chamber pacemaker wound check programmed VVIR. Presenting rhythm: BV 60 . Wound well healed. Routine testing performed. Thresholds, sensing, and impedances consistent with implant measurements. No episodes.  Pt enrolled in remote follow-up.

## 2024-05-22 DIAGNOSIS — K449 Diaphragmatic hernia without obstruction or gangrene: Secondary | ICD-10-CM | POA: Diagnosis not present

## 2024-05-22 DIAGNOSIS — R072 Precordial pain: Secondary | ICD-10-CM | POA: Diagnosis not present

## 2024-05-22 DIAGNOSIS — R0789 Other chest pain: Secondary | ICD-10-CM | POA: Diagnosis not present

## 2024-05-22 DIAGNOSIS — R1010 Upper abdominal pain, unspecified: Secondary | ICD-10-CM | POA: Diagnosis not present

## 2024-05-22 DIAGNOSIS — R531 Weakness: Secondary | ICD-10-CM | POA: Diagnosis not present

## 2024-05-22 DIAGNOSIS — Z955 Presence of coronary angioplasty implant and graft: Secondary | ICD-10-CM | POA: Diagnosis not present

## 2024-05-22 DIAGNOSIS — Z87891 Personal history of nicotine dependence: Secondary | ICD-10-CM | POA: Diagnosis not present

## 2024-05-22 DIAGNOSIS — R001 Bradycardia, unspecified: Secondary | ICD-10-CM | POA: Diagnosis not present

## 2024-05-22 DIAGNOSIS — R1084 Generalized abdominal pain: Secondary | ICD-10-CM | POA: Diagnosis not present

## 2024-05-22 DIAGNOSIS — I4891 Unspecified atrial fibrillation: Secondary | ICD-10-CM | POA: Diagnosis not present

## 2024-05-22 DIAGNOSIS — E7800 Pure hypercholesterolemia, unspecified: Secondary | ICD-10-CM | POA: Diagnosis not present

## 2024-05-22 DIAGNOSIS — R918 Other nonspecific abnormal finding of lung field: Secondary | ICD-10-CM | POA: Diagnosis not present

## 2024-05-22 DIAGNOSIS — Z85028 Personal history of other malignant neoplasm of stomach: Secondary | ICD-10-CM | POA: Diagnosis not present

## 2024-05-22 DIAGNOSIS — N189 Chronic kidney disease, unspecified: Secondary | ICD-10-CM | POA: Diagnosis not present

## 2024-05-22 DIAGNOSIS — R1013 Epigastric pain: Secondary | ICD-10-CM | POA: Diagnosis not present

## 2024-05-22 DIAGNOSIS — Z79899 Other long term (current) drug therapy: Secondary | ICD-10-CM | POA: Diagnosis not present

## 2024-05-22 DIAGNOSIS — Z7901 Long term (current) use of anticoagulants: Secondary | ICD-10-CM | POA: Diagnosis not present

## 2024-05-22 DIAGNOSIS — R079 Chest pain, unspecified: Secondary | ICD-10-CM | POA: Diagnosis not present

## 2024-05-22 NOTE — Progress Notes (Signed)
 Remote pacemaker transmission.

## 2024-06-18 DIAGNOSIS — Z7901 Long term (current) use of anticoagulants: Secondary | ICD-10-CM | POA: Diagnosis not present

## 2024-06-18 DIAGNOSIS — N184 Chronic kidney disease, stage 4 (severe): Secondary | ICD-10-CM | POA: Diagnosis not present

## 2024-06-18 DIAGNOSIS — E785 Hyperlipidemia, unspecified: Secondary | ICD-10-CM | POA: Diagnosis not present

## 2024-06-18 DIAGNOSIS — I130 Hypertensive heart and chronic kidney disease with heart failure and stage 1 through stage 4 chronic kidney disease, or unspecified chronic kidney disease: Secondary | ICD-10-CM | POA: Diagnosis not present

## 2024-06-24 DIAGNOSIS — L0100 Impetigo, unspecified: Secondary | ICD-10-CM | POA: Diagnosis not present

## 2024-06-24 DIAGNOSIS — L820 Inflamed seborrheic keratosis: Secondary | ICD-10-CM | POA: Diagnosis not present

## 2024-06-30 DIAGNOSIS — S01111D Laceration without foreign body of right eyelid and periocular area, subsequent encounter: Secondary | ICD-10-CM | POA: Diagnosis not present

## 2024-06-30 DIAGNOSIS — I4891 Unspecified atrial fibrillation: Secondary | ICD-10-CM | POA: Diagnosis not present

## 2024-06-30 DIAGNOSIS — N1831 Chronic kidney disease, stage 3a: Secondary | ICD-10-CM | POA: Diagnosis not present

## 2024-06-30 DIAGNOSIS — L089 Local infection of the skin and subcutaneous tissue, unspecified: Secondary | ICD-10-CM | POA: Diagnosis not present

## 2024-06-30 DIAGNOSIS — S42301D Unspecified fracture of shaft of humerus, right arm, subsequent encounter for fracture with routine healing: Secondary | ICD-10-CM | POA: Diagnosis not present

## 2024-06-30 DIAGNOSIS — Z9181 History of falling: Secondary | ICD-10-CM | POA: Diagnosis not present

## 2024-06-30 DIAGNOSIS — M1711 Unilateral primary osteoarthritis, right knee: Secondary | ICD-10-CM | POA: Diagnosis not present

## 2024-06-30 DIAGNOSIS — Z87891 Personal history of nicotine dependence: Secondary | ICD-10-CM | POA: Diagnosis not present

## 2024-06-30 DIAGNOSIS — I255 Ischemic cardiomyopathy: Secondary | ICD-10-CM | POA: Diagnosis not present

## 2024-06-30 DIAGNOSIS — Z792 Long term (current) use of antibiotics: Secondary | ICD-10-CM | POA: Diagnosis not present

## 2024-06-30 DIAGNOSIS — S51001D Unspecified open wound of right elbow, subsequent encounter: Secondary | ICD-10-CM | POA: Diagnosis not present

## 2024-06-30 DIAGNOSIS — Z950 Presence of cardiac pacemaker: Secondary | ICD-10-CM | POA: Diagnosis not present

## 2024-06-30 DIAGNOSIS — E785 Hyperlipidemia, unspecified: Secondary | ICD-10-CM | POA: Diagnosis not present

## 2024-06-30 DIAGNOSIS — Z7901 Long term (current) use of anticoagulants: Secondary | ICD-10-CM | POA: Diagnosis not present

## 2024-06-30 DIAGNOSIS — Z79891 Long term (current) use of opiate analgesic: Secondary | ICD-10-CM | POA: Diagnosis not present

## 2024-06-30 DIAGNOSIS — I129 Hypertensive chronic kidney disease with stage 1 through stage 4 chronic kidney disease, or unspecified chronic kidney disease: Secondary | ICD-10-CM | POA: Diagnosis not present

## 2024-07-09 DIAGNOSIS — X32XXXA Exposure to sunlight, initial encounter: Secondary | ICD-10-CM | POA: Diagnosis not present

## 2024-07-09 DIAGNOSIS — L570 Actinic keratosis: Secondary | ICD-10-CM | POA: Diagnosis not present

## 2024-07-09 DIAGNOSIS — L820 Inflamed seborrheic keratosis: Secondary | ICD-10-CM | POA: Diagnosis not present

## 2024-07-24 ENCOUNTER — Ambulatory Visit (INDEPENDENT_AMBULATORY_CARE_PROVIDER_SITE_OTHER)

## 2024-07-24 DIAGNOSIS — I442 Atrioventricular block, complete: Secondary | ICD-10-CM

## 2024-07-24 LAB — CUP PACEART REMOTE DEVICE CHECK
Battery Remaining Longevity: 66 mo
Battery Voltage: 3.12 V
Brady Statistic AP VP Percent: 0 %
Brady Statistic AP VS Percent: 0 %
Brady Statistic AS VP Percent: 99.74 %
Brady Statistic AS VS Percent: 0.26 %
Brady Statistic RA Percent Paced: 0 %
Brady Statistic RV Percent Paced: 99.74 %
Date Time Interrogation Session: 20250919033554
Implantable Lead Connection Status: 753985
Implantable Lead Connection Status: 753985
Implantable Lead Connection Status: 753985
Implantable Lead Implant Date: 20200217
Implantable Lead Implant Date: 20200217
Implantable Lead Implant Date: 20200217
Implantable Lead Location: 753858
Implantable Lead Location: 753859
Implantable Lead Location: 753860
Implantable Lead Model: 4398
Implantable Lead Model: 5076
Implantable Lead Model: 5076
Implantable Pulse Generator Implant Date: 20250619
Lead Channel Impedance Value: 266 Ohm
Lead Channel Impedance Value: 323 Ohm
Lead Channel Impedance Value: 323 Ohm
Lead Channel Impedance Value: 342 Ohm
Lead Channel Impedance Value: 494 Ohm
Lead Channel Impedance Value: 532 Ohm
Lead Channel Impedance Value: 551 Ohm
Lead Channel Impedance Value: 551 Ohm
Lead Channel Impedance Value: 741 Ohm
Lead Channel Impedance Value: 760 Ohm
Lead Channel Impedance Value: 779 Ohm
Lead Channel Impedance Value: 798 Ohm
Lead Channel Impedance Value: 931 Ohm
Lead Channel Impedance Value: 988 Ohm
Lead Channel Pacing Threshold Amplitude: 1.375 V
Lead Channel Pacing Threshold Amplitude: 2.375 V
Lead Channel Pacing Threshold Pulse Width: 0.4 ms
Lead Channel Pacing Threshold Pulse Width: 1 ms
Lead Channel Sensing Intrinsic Amplitude: 0.625 mV
Lead Channel Sensing Intrinsic Amplitude: 10.875 mV
Lead Channel Sensing Intrinsic Amplitude: 10.875 mV
Lead Channel Setting Pacing Amplitude: 2.75 V
Lead Channel Setting Pacing Amplitude: 3 V
Lead Channel Setting Pacing Pulse Width: 0.8 ms
Lead Channel Setting Pacing Pulse Width: 1 ms
Lead Channel Setting Sensing Sensitivity: 1.2 mV
Zone Setting Status: 755011

## 2024-07-25 NOTE — Progress Notes (Unsigned)
  Electrophysiology Office Note:   Date:  07/27/2024  ID:  Mark Lane, DOB [birth year], MRN [id number]  Primary Cardiologist: None Primary Heart Failure: Ezra Shuck, MD Electrophysiologist: Danelle Birmingham, MD       History of Present Illness:   Mark Lane is a 89 y.o. male with h/o CHB s/p PPM, ICM, AF, HLD, CKD, GERD seen today for routine electrophysiology follow-up s/p PPM generator change.   Since last being seen in our clinic the patient reports doing well overall. He has no device related concerns. Talks about being a Engineer, maintenance (IT).    He denies chest pain, palpitations, dyspnea, PND, orthopnea, nausea, vomiting, dizziness, syncope, edema, weight gain, or early satiety.    Review of systems complete and found to be negative unless listed in HPI.    EP Information / Studies Reviewed:    EKG is ordered today. Personal review as below.  EKG Interpretation Date/Time:  Monday July 27 2024 15:47:23 EDT Ventricular Rate:  68 PR Interval:    QRS Duration:  148 QT Interval:  428 QTC Calculation: 455 R Axis:   -33  Text Interpretation: AFib, Ventricular paced rhythm Confirmed by Aniceto Jarvis ([id number]) on 07/27/2024 3:59:24 PM   PPM Interrogation-  reviewed in detail today,  See PACEART report.  Device History: Medtronic BiV PPM implanted 12/22/18 for CHB, ICM  Generator Change > 04/23/24  Risk Assessment/Calculations:    CHA2DS2-VASc Score = 4   This indicates a 4.8% annual risk of stroke. The patient's score is based upon: CHF History: 1 HTN History: 0 Diabetes History: 0 Stroke History: 0 Vascular Disease History: 1 Age Score: 2 Gender Score: 0             Physical Exam:   VS:  BP 104/64   Pulse 68   Ht 5' 4 (1.626 m)   Wt 144 lb 6.4 oz (65.5 kg)   SpO2 99%   BMI 24.79 kg/m    Wt Readings from Last 3 Encounters:  07/27/24 144 lb 6.4 oz (65.5 kg)  04/23/24 125 lb (56.7 kg)  02/21/24 148 lb (67.1 kg)     GEN: Well nourished, well developed in no  acute distress NECK: No JVD; No carotid bruits CARDIAC: Regular rate and rhythm (VP), no murmurs, rubs, gallops RESPIRATORY:  Clear to auscultation without rales, wheezing or rhonchi  ABDOMEN: Soft, non-tender, non-distended EXTREMITIES:  LE 1+ edema; No deformity   ASSESSMENT AND PLAN:    CHB, HFrEF / ICM s/p Medtronic CRT-P -Normal PPM function -See Pace Art report -No changes today -LE 1+ edema on exam, pt reports goes down at night   -99.7% BiV pacing   Persistent Atrial Fibrillation  -OAC for stroke prophylaxis  -continue Toprol  12.5 mg daily   Secondary Hypercoagulable State  -continue Eliquis  2.5mg  BID, dose reviewed and appropriate by age / Cr   Disposition:   Follow up with Dr. Birmingham in 12 months  Signed, Jarvis Aniceto, NP-C, AGACNP-BC [facility] - Electrophysiology  07/27/2024, 5:32 PM

## 2024-07-27 ENCOUNTER — Encounter: Payer: Self-pay | Admitting: Pulmonary Disease

## 2024-07-27 ENCOUNTER — Ambulatory Visit: Attending: Pulmonary Disease | Admitting: Pulmonary Disease

## 2024-07-27 VITALS — BP 104/64 | HR 68 | Ht 64.0 in | Wt 144.4 lb

## 2024-07-27 DIAGNOSIS — D6869 Other thrombophilia: Secondary | ICD-10-CM

## 2024-07-27 DIAGNOSIS — I4819 Other persistent atrial fibrillation: Secondary | ICD-10-CM | POA: Diagnosis not present

## 2024-07-27 DIAGNOSIS — I5022 Chronic systolic (congestive) heart failure: Secondary | ICD-10-CM

## 2024-07-27 DIAGNOSIS — I442 Atrioventricular block, complete: Secondary | ICD-10-CM | POA: Diagnosis not present

## 2024-07-27 LAB — CUP PACEART INCLINIC DEVICE CHECK
Date Time Interrogation Session: 20250922173046
Implantable Lead Connection Status: 753985
Implantable Lead Connection Status: 753985
Implantable Lead Connection Status: 753985
Implantable Lead Implant Date: 20200217
Implantable Lead Implant Date: 20200217
Implantable Lead Implant Date: 20200217
Implantable Lead Location: 753858
Implantable Lead Location: 753859
Implantable Lead Location: 753860
Implantable Lead Model: 4398
Implantable Lead Model: 5076
Implantable Lead Model: 5076
Implantable Pulse Generator Implant Date: 20250619

## 2024-07-27 NOTE — Patient Instructions (Signed)
 Medication Instructions:  Your physician recommends that you continue on your current medications as directed. Please refer to the Current Medication list given to you today.  *If you need a refill on your cardiac medications before your next appointment, please call your pharmacy*  Lab Work: None ordered If you have labs (blood work) drawn today and your tests are completely normal, you will receive your results only by: MyChart Message (if you have MyChart) OR A paper copy in the mail If you have any lab test that is abnormal or we need to change your treatment, we will call you to review the results.  Follow-Up: At [facility], you and your health needs are our priority.  As part of our continuing mission to provide you with exceptional heart care, our providers are all part of one team.  This team includes your primary Cardiologist (physician) and Advanced Practice Providers or APPs (Physician Assistants and Nurse Practitioners) who all work together to provide you with the care you need, when you need it.  Your next appointment:   1 year(s)  Provider:   You will see one of the following Advanced Practice Providers on your designated Care Team:   Charlies Arthur, [place] Ozell Jodie Passey, PA-C Suzann Riddle, NP Daphne Barrack, NP Artist Pouch, PA-C

## 2024-07-28 NOTE — Progress Notes (Signed)
 Remote PPM Transmission

## 2024-08-01 ENCOUNTER — Ambulatory Visit: Payer: Self-pay | Admitting: Internal Medicine

## 2024-08-12 DIAGNOSIS — M79604 Pain in right leg: Secondary | ICD-10-CM | POA: Diagnosis not present

## 2024-08-12 DIAGNOSIS — F0390 Unspecified dementia without behavioral disturbance: Secondary | ICD-10-CM | POA: Diagnosis not present

## 2024-08-12 DIAGNOSIS — G8929 Other chronic pain: Secondary | ICD-10-CM | POA: Diagnosis not present

## 2024-08-12 DIAGNOSIS — M79605 Pain in left leg: Secondary | ICD-10-CM | POA: Diagnosis not present

## 2024-08-13 DIAGNOSIS — S42211A Unspecified displaced fracture of surgical neck of right humerus, initial encounter for closed fracture: Secondary | ICD-10-CM | POA: Diagnosis not present

## 2024-08-13 DIAGNOSIS — S01111A Laceration without foreign body of right eyelid and periocular area, initial encounter: Secondary | ICD-10-CM | POA: Diagnosis not present

## 2024-08-13 DIAGNOSIS — S098XXA Other specified injuries of head, initial encounter: Secondary | ICD-10-CM | POA: Diagnosis not present

## 2024-08-13 DIAGNOSIS — Z87891 Personal history of nicotine dependence: Secondary | ICD-10-CM | POA: Diagnosis not present

## 2024-08-13 DIAGNOSIS — W19XXXA Unspecified fall, initial encounter: Secondary | ICD-10-CM | POA: Diagnosis not present

## 2024-08-13 DIAGNOSIS — Z79899 Other long term (current) drug therapy: Secondary | ICD-10-CM | POA: Diagnosis not present

## 2024-08-13 DIAGNOSIS — E7800 Pure hypercholesterolemia, unspecified: Secondary | ICD-10-CM | POA: Diagnosis not present

## 2024-08-13 DIAGNOSIS — I6782 Cerebral ischemia: Secondary | ICD-10-CM | POA: Diagnosis not present

## 2024-08-13 DIAGNOSIS — S1989XA Other specified injuries of other specified part of neck, initial encounter: Secondary | ICD-10-CM | POA: Diagnosis not present

## 2024-08-13 DIAGNOSIS — M4802 Spinal stenosis, cervical region: Secondary | ICD-10-CM | POA: Diagnosis not present

## 2024-08-13 DIAGNOSIS — M19011 Primary osteoarthritis, right shoulder: Secondary | ICD-10-CM | POA: Diagnosis not present

## 2024-08-13 DIAGNOSIS — Z7901 Long term (current) use of anticoagulants: Secondary | ICD-10-CM | POA: Diagnosis not present

## 2024-08-13 DIAGNOSIS — S4981XA Other specified injuries of right shoulder and upper arm, initial encounter: Secondary | ICD-10-CM | POA: Diagnosis not present

## 2024-08-13 DIAGNOSIS — M25519 Pain in unspecified shoulder: Secondary | ICD-10-CM | POA: Diagnosis not present

## 2024-08-16 DIAGNOSIS — H109 Unspecified conjunctivitis: Secondary | ICD-10-CM | POA: Diagnosis not present

## 2024-08-16 DIAGNOSIS — G8911 Acute pain due to trauma: Secondary | ICD-10-CM | POA: Diagnosis not present

## 2024-08-16 DIAGNOSIS — E7800 Pure hypercholesterolemia, unspecified: Secondary | ICD-10-CM | POA: Diagnosis not present

## 2024-08-16 DIAGNOSIS — R58 Hemorrhage, not elsewhere classified: Secondary | ICD-10-CM | POA: Diagnosis not present

## 2024-08-16 DIAGNOSIS — H1032 Unspecified acute conjunctivitis, left eye: Secondary | ICD-10-CM | POA: Diagnosis not present

## 2024-08-16 DIAGNOSIS — Z87891 Personal history of nicotine dependence: Secondary | ICD-10-CM | POA: Diagnosis not present

## 2024-08-16 DIAGNOSIS — M79603 Pain in arm, unspecified: Secondary | ICD-10-CM | POA: Diagnosis not present

## 2024-08-16 DIAGNOSIS — Z79899 Other long term (current) drug therapy: Secondary | ICD-10-CM | POA: Diagnosis not present

## 2024-08-16 DIAGNOSIS — W19XXXA Unspecified fall, initial encounter: Secondary | ICD-10-CM | POA: Diagnosis not present

## 2024-08-16 DIAGNOSIS — S40021A Contusion of right upper arm, initial encounter: Secondary | ICD-10-CM | POA: Diagnosis not present

## 2024-08-16 DIAGNOSIS — Z7901 Long term (current) use of anticoagulants: Secondary | ICD-10-CM | POA: Diagnosis not present

## 2024-08-18 DIAGNOSIS — M25511 Pain in right shoulder: Secondary | ICD-10-CM | POA: Diagnosis not present

## 2024-08-18 DIAGNOSIS — S42291A Other displaced fracture of upper end of right humerus, initial encounter for closed fracture: Secondary | ICD-10-CM | POA: Diagnosis not present

## 2024-09-04 DIAGNOSIS — Z79899 Other long term (current) drug therapy: Secondary | ICD-10-CM | POA: Diagnosis not present

## 2024-09-04 DIAGNOSIS — Z7901 Long term (current) use of anticoagulants: Secondary | ICD-10-CM | POA: Diagnosis not present

## 2024-09-04 DIAGNOSIS — R4781 Slurred speech: Secondary | ICD-10-CM | POA: Diagnosis not present

## 2024-09-04 DIAGNOSIS — R29810 Facial weakness: Secondary | ICD-10-CM | POA: Diagnosis not present

## 2024-09-04 DIAGNOSIS — G459 Transient cerebral ischemic attack, unspecified: Secondary | ICD-10-CM | POA: Diagnosis not present

## 2024-09-04 DIAGNOSIS — Z87891 Personal history of nicotine dependence: Secondary | ICD-10-CM | POA: Diagnosis not present

## 2024-09-05 DIAGNOSIS — Z87891 Personal history of nicotine dependence: Secondary | ICD-10-CM | POA: Diagnosis not present

## 2024-09-05 DIAGNOSIS — S1989XA Other specified injuries of other specified part of neck, initial encounter: Secondary | ICD-10-CM | POA: Diagnosis not present

## 2024-09-05 DIAGNOSIS — S0083XA Contusion of other part of head, initial encounter: Secondary | ICD-10-CM | POA: Diagnosis not present

## 2024-09-05 DIAGNOSIS — M47812 Spondylosis without myelopathy or radiculopathy, cervical region: Secondary | ICD-10-CM | POA: Diagnosis not present

## 2024-09-05 DIAGNOSIS — S0081XA Abrasion of other part of head, initial encounter: Secondary | ICD-10-CM | POA: Diagnosis not present

## 2024-09-05 DIAGNOSIS — W19XXXA Unspecified fall, initial encounter: Secondary | ICD-10-CM | POA: Diagnosis not present

## 2024-09-05 DIAGNOSIS — S42211A Unspecified displaced fracture of surgical neck of right humerus, initial encounter for closed fracture: Secondary | ICD-10-CM | POA: Diagnosis not present

## 2024-09-05 DIAGNOSIS — S098XXA Other specified injuries of head, initial encounter: Secondary | ICD-10-CM | POA: Diagnosis not present

## 2024-09-05 DIAGNOSIS — S0031XA Abrasion of nose, initial encounter: Secondary | ICD-10-CM | POA: Diagnosis not present

## 2024-09-05 DIAGNOSIS — G9389 Other specified disorders of brain: Secondary | ICD-10-CM | POA: Diagnosis not present

## 2024-09-05 DIAGNOSIS — R58 Hemorrhage, not elsewhere classified: Secondary | ICD-10-CM | POA: Diagnosis not present

## 2024-09-08 DIAGNOSIS — M25511 Pain in right shoulder: Secondary | ICD-10-CM | POA: Diagnosis not present

## 2024-09-08 DIAGNOSIS — S42291A Other displaced fracture of upper end of right humerus, initial encounter for closed fracture: Secondary | ICD-10-CM | POA: Diagnosis not present

## 2024-09-09 ENCOUNTER — Other Ambulatory Visit: Payer: Self-pay

## 2024-09-09 ENCOUNTER — Encounter (HOSPITAL_COMMUNITY): Payer: Self-pay | Admitting: Orthopaedic Surgery

## 2024-09-09 ENCOUNTER — Telehealth (HOSPITAL_BASED_OUTPATIENT_CLINIC_OR_DEPARTMENT_OTHER): Payer: Self-pay | Admitting: *Deleted

## 2024-09-09 ENCOUNTER — Encounter: Payer: Self-pay | Admitting: Internal Medicine

## 2024-09-09 NOTE — Progress Notes (Unsigned)
 PERIOPERATIVE PRESCRIPTION FOR IMPLANTED CARDIAC DEVICE PROGRAMMING  Patient Information: Name:  Mark Lane  DOB:  January 07, 1922  MRN:  [id number]  Procedure:  Right shoulder total reverse arthroplasty   Date of Surgery:  Clearance 09/10/24                                  Surgeon:  Dr. Bonner Hair Surgeon's Group or Practice Name:  [facility] Phone number:  [phone] Fax number:  [phone]   Type of Clearance Requested:   - Medical  - Pharmacy:  Hold Apixaban  (Eliquis ) Not indicated   Type of Anesthesia:  General  Interscalene block  Device Information:  Clinic EP Physician:  Danelle Birmingham, MD   Device Type:  Pacemaker Manufacturer and Phone #:  Medtronic: [id number] Pacemaker Dependent?:  Yes.   Date of Last Device Check:  07/27/24 Normal Device Function?:  Yes.    Electrophysiologist's Recommendations:  Have magnet available. Provide continuous ECG monitoring when magnet is used or reprogramming is to be performed.  Procedure will likely interfere with device function.  Device should be programmed:  Asynchronous pacing during procedure and returned to normal programming after procedure  Per Device Clinic Standing Orders, Prentice JINNY Silvan, RN  11:45 AM 09/09/2024

## 2024-09-09 NOTE — Telephone Encounter (Signed)
   Pre-operative Risk Assessment    Patient Name: Townsend Cudworth  DOB: [birth year] MRN: [id number]   Date of last office visit: 07/27/2024 Date of next office visit: None  Request for Surgical Clearance    Procedure:  Right shoulder total reverse arthroplasty  Date of Surgery:  Clearance 09/10/24                                 Surgeon:  Dr. Bonner Hair Surgeon's Group or Practice Name:  [facility] Phone number:  [phone] Fax number:  [phone]   Type of Clearance Requested:   - Medical  - Pharmacy:  Hold Apixaban  (Eliquis ) Not indicated   Type of Anesthesia:  General  Interscalene block  Additional requests/questions:  Sent to device clinic.   Signed, Edsel Grayce Sanders   09/09/2024, 10:56 AM

## 2024-09-09 NOTE — Telephone Encounter (Signed)
 Patient is on Eliquis  which should be held prior to the surgery.  Pharmacy should weigh in on this first. Will need to contact the surgeon about rescheduling the surgery. Cannot hold this short notice unless he was already told to hold the Eliquis .

## 2024-09-09 NOTE — Progress Notes (Signed)
 -------------    SDW INSTRUCTIONS given:  Your procedure is scheduled on September 10, 2024.  Report to [facility] Main Entrance A at 9:30 A.M., and check in at the Admitting office.  Call this number if you have problems the morning of surgery:  [phone]   Remember:  Do not eat or drink after midnight the night before your surgery     Take these medicines the morning of surgery with A SIP OF WATER   acetaminophen  (TYLENOL )  cetirizine (ZYRTEC)  metoprolol  succinate (TOPROL  XL)  pantoprazole  (PROTONIX )   As of today, STOP taking any Aspirin  (unless otherwise instructed by your surgeon) Aleve, Naproxen, Ibuprofen, Motrin, Advil, Goody's, BC's, all herbal medications, fish oil, and all vitamins.                      Do not wear jewelry, make up, or nail polish            Do not wear lotions, powders, perfumes/colognes, or deodorant.            Do not shave 48 hours prior to surgery.  Men may shave face and neck.            Do not bring valuables to the hospital.            [facility] is not responsible for any belongings or valuables.  Do NOT Smoke (Tobacco/Vaping) 24 hours prior to your procedure If you use a CPAP at night, you may bring all equipment for your overnight stay.   Contacts, glasses, dentures or bridgework may not be worn into surgery.      For patients admitted to the hospital, discharge time will be determined by your treatment team.   Patients discharged the day of surgery will not be allowed to drive home, and someone needs to stay with them for 24 hours.    Special instructions:   [place]- Preparing For Surgery  Before surgery, you can play an important role. Because skin is not sterile, your skin needs to be as free of germs as possible. You can reduce the number of germs on your skin by washing with CHG (chlorahexidine gluconate) Soap before surgery.  CHG is an antiseptic cleaner which kills germs and bonds with the skin to continue killing germs  even after washing.    Oral Hygiene is also important to reduce your risk of infection.  Remember - BRUSH YOUR TEETH THE MORNING OF SURGERY WITH YOUR REGULAR TOOTHPASTE  Please do not use if you have an allergy to CHG or antibacterial soaps. If your skin becomes reddened/irritated stop using the CHG.  Do not shave (including legs and underarms) for at least 48 hours prior to first CHG shower. It is OK to shave your face.  Please follow these instructions carefully.   Shower the NIGHT BEFORE SURGERY and the MORNING OF SURGERY with DIAL Soap.   Pat yourself dry with a CLEAN TOWEL.  Wear CLEAN PAJAMAS to bed the night before surgery  Place CLEAN SHEETS on your bed the night of your first shower and DO NOT SLEEP WITH PETS.   Day of Surgery: Please shower morning of surgery  Wear Clean/Comfortable clothing the morning of surgery Do not apply any deodorants/lotions.   Remember to brush your teeth WITH YOUR REGULAR TOOTHPASTE.   Questions were answered. Patient verbalized understanding of instructions.

## 2024-09-09 NOTE — Telephone Encounter (Signed)
 Left message that I am going top fax over notes from the preop APP about blood thinner.   Please call back and let our office know if the pt has been holding blood thinner.

## 2024-09-09 NOTE — Progress Notes (Signed)
 Anesthesia Chart Review: Same day workup  89 year old male follows with cardiology for history of CHB s/p Medtronic BiV PPM (implanted 12/22/2018; generator change out 04/23/2024), ICM, AF on Eliquis , HLD.  Echo 06/2020 showed LVEF 50%, regional wall motion abnormalities (full results copied below), grade 2 DD, normal RV systolic function, no significant valve abnormalities.  Last seen by Daphne Barrack, NP on 07/27/2024 and noted to be doing well from cardiac standpoint.  No changes to management.  1 year follow-up recommended.  Other pertinent history includes GERD on PPI, CKD 3b by labs.  Dr. Cristy advised patient to hold Eliquis  as of 09/08/2024.  BMP and CBC 08/30/2023 reviewed, creatinine 1.62 consistent with history of CKD, mild anemia with hemoglobin 11.9, BNP chronically elevated 364.5 (prior comparisons over the past 2 years 325.8, 454.1, 354.4).  Patient will need day of surgery evaluation.  EKG 07/27/2024: A-fib.  Ventricular paced rhythm.  Rate 68.  Perioperative prescription for implanted cardiac device programming per progress note 09/09/2024: Device Information:   Clinic EP Physician:  Danelle Birmingham, MD    Device Type:  Pacemaker Manufacturer and Phone #:  Medtronic: [phone] Pacemaker Dependent?:  Yes.   Date of Last Device Check:  07/27/24           Normal Device Function?:  Yes.     Electrophysiologist's Recommendations:   Have magnet available. Provide continuous ECG monitoring when magnet is used or reprogramming is to be performed.  Procedure will likely interfere with device function.  Device should be programmed:  Asynchronous pacing during procedure and returned to normal programming after procedure    Lynwood Geofm RIGGERS [facility] Short Stay Center/Anesthesiology Phone [phone] 09/09/2024 4:16 PM

## 2024-09-09 NOTE — Anesthesia Preprocedure Evaluation (Signed)
 Anesthesia Evaluation  Patient identified by MRN, date of birth, ID band Patient awake    Reviewed: Allergy & Precautions, NPO status , Patient's Chart, lab work & pertinent test results  History of Anesthesia Complications Negative for: history of anesthetic complications  Airway Mallampati: II  TM Distance: >3 FB Neck ROM: Full    Dental  (+) Dental Advisory Given, Missing   Pulmonary neg shortness of breath, neg sleep apnea, neg COPD, neg recent URI, former smoker   Pulmonary exam normal breath sounds clear to auscultation       Cardiovascular (-) hypertension(-) angina + CAD, + Past MI and +CHF  (-) Cardiac Stents and (-) CABG + dysrhythmias (1st degree AV block, complete heart block) Atrial Fibrillation + pacemaker  Rhythm:Regular Rate:Normal  HLD  TTE 07/03/2020: IMPRESSIONS    1. Left ventricular ejection fraction, by estimation, is 50%. The left  ventricle has low normal function. The left ventricle demonstrates  regional wall motion abnormalities (see scoring diagram/findings for  description). Left ventricular diastolic  parameters are consistent with Grade II diastolic dysfunction  (pseudonormalization).   2. Right ventricular systolic function is normal. The right ventricular  size is normal. There is normal pulmonary artery systolic pressure. The  estimated right ventricular systolic pressure is 27.2 mmHg.   3. Left atrial size was moderately dilated.   4. The mitral valve is grossly normal. Trivial mitral valve  regurgitation.   5. The aortic valve is tricuspid. Aortic valve regurgitation is trivial.  Mild aortic valve sclerosis is present, with no evidence of aortic valve  stenosis.   6. The inferior vena cava is normal in size with greater than 50%  respiratory variability, suggesting right atrial pressure of 3 mmHg.     Neuro/Psych negative neurological ROS     GI/Hepatic Neg liver ROS,GERD  ,,   Endo/Other  negative endocrine ROS    Renal/GU CRFRenal disease     Musculoskeletal   Abdominal   Peds  Hematology negative hematology ROS (+)   Anesthesia Other Findings Last Eliquis : 09/08/2024  Electrophysiologist's Recommendations:    Have magnet available.  Provide continuous ECG monitoring when magnet is used or reprogramming is to be performed.   Procedure will likely interfere with device function.  Device should be programmed:  Asynchronous pacing during procedure and returned to normal programming after procedure   Reproductive/Obstetrics                              Anesthesia Physical Anesthesia Plan  ASA: 3  Anesthesia Plan: General   Post-op Pain Management: Regional block* and Tylenol  PO (pre-op)*   Induction: Intravenous  PONV Risk Score and Plan: 2 and Ondansetron , Dexamethasone, Treatment may vary due to age or medical condition, Propofol  infusion and TIVA  Airway Management Planned: Oral ETT  Additional Equipment:   Intra-op Plan:   Post-operative Plan: Extubation in OR  Informed Consent: I have reviewed the patients History and Physical, chart, labs and discussed the procedure including the risks, benefits and alternatives for the proposed anesthesia with the patient or authorized representative who has indicated his/her understanding and acceptance.     Dental advisory given  Plan Discussed with: CRNA and Anesthesiologist  Anesthesia Plan Comments: (Discussed potential risks of nerve blocks including, but not limited to, infection, bleeding, nerve damage, seizures, pneumothorax, respiratory depression, and potential failure of the block. Alternatives to nerve blocks discussed. All questions answered.  Risks of general anesthesia discussed  including, but not limited to, sore throat, hoarse voice, chipped/damaged teeth, injury to vocal cords, nausea and vomiting, allergic reactions, lung infection, heart attack,  stroke, and death. All questions answered.   PAT note by Lynwood Hope, PA-C: 89 year old male follows with cardiology for history of CHB s/p Medtronic BiV PPM (implanted 12/22/2018; generator change out 04/23/2024), ICM, AF on Eliquis , HLD.  Echo 06/2020 showed LVEF 50%, regional wall motion abnormalities (full results copied below), grade 2 DD, normal RV systolic function, no significant valve abnormalities.  Last seen by Daphne Barrack, NP on 07/27/2024 and noted to be doing well from cardiac standpoint.  No changes to management.  1 year follow-up recommended.  Other pertinent history includes GERD on PPI, CKD 3b by labs.  Dr. Cristy advised patient to hold Eliquis  as of 09/08/2024.  BMP and CBC 08/30/2023 reviewed, creatinine 1.62 consistent with history of CKD, mild anemia with hemoglobin 11.9, BNP chronically elevated 364.5 (prior comparisons over the past 2 years 325.8, 454.1, 354.4).  Patient will need day of surgery evaluation.  EKG 07/27/2024: A-fib.  Ventricular paced rhythm.  Rate 68.  Perioperative prescription for implanted cardiac device programming per progress note 09/09/2024: Device Information:  Clinic EP Physician:  Danelle Birmingham, MD   Device Type:  Pacemaker Manufacturer and Phone #:  Medtronic: [phone] Pacemaker Dependent?:  Yes.   Date of Last Device Check:  07/27/24           Normal Device Function?:  Yes.    Electrophysiologist's Recommendations:   Have magnet available.  Provide continuous ECG monitoring when magnet is used or reprogramming is to be performed.   Procedure will likely interfere with device function.  Device should be programmed:  Asynchronous pacing during procedure and returned to normal programming after procedure  )         Anesthesia Quick Evaluation

## 2024-09-09 NOTE — Progress Notes (Addendum)
 SDW CALL  Patient was given pre-op instructions over the phone. The opportunity was given for the patient to ask questions. No further questions asked. Patient verbalized understanding of instructions given.   PCP -  Patient is a resident at [place] Cardiologist -  Electrophysiology - Cardiology Waddell Danelle ORN, MD  PPM/ICD - Pacemaker Device Orders - requested Rep Notified - yes as of 09-10-24  Chest x-ray -  EKG - 07-27-24 Stress Test -  ECHO - 07-03-20 Cardiac Cath -03-20-11 (CE)   Sleep Study -  CPAP - n/a  Dm -denies  Blood Thinner Instructions: apixaban  (ELIQUIS ) per nurse at facility patient not had medication since 09-08-24 per surgeon instructions Aspirin  Instructions:  ERAS Protcol - npo   COVID TEST- n/a   Anesthesia review: yes, Hx of CAD, CHF  Patient denies shortness of breath, fever, cough and chest pain over the phone call   All instructions explained to the patient, with a verbal understanding of the material. Patient agrees to go over the instructions while at home for a better understanding.

## 2024-09-09 NOTE — H&P (Signed)
 PREOPERATIVE H&P  Chief Complaint: Fracture of humeral head, right  HPI: Mark Lane is a 89 y.o. male who is scheduled for Procedure(s): ARTHROPLASTY, SHOULDER, TOTAL, REVERSE OPEN REDUCTION INTERNAL FIXATION (ORIF) PROXIMAL HUMERUS FRACTURE.   Patient is a 89 year old who had a fall. He suffered a right proximal humerus fracture. There was an attempt at non-operative management. However, he continued to have further displacement of the fracture. He is frustrated by the limitations and continued pain.   Symptoms are rated as moderate to severe, and have been worsening.  This is significantly impairing activities of daily living.    Please see clinic note for further details on this patient's care.    He has elected for surgical management.   Past Medical History:  Diagnosis Date   CHF (congestive heart failure) (HCC)    Coronary artery disease    History of kidney stones    years ago   Hyperlipidemia    Past Surgical History:  Procedure Laterality Date   BIOPSY  07/05/2020   Procedure: BIOPSY;  Surgeon: Eartha Angelia Sieving, MD;  [facility];  Service: Gastroenterology;;   BIV PACEMAKER GENERATOR CHANGEOUT N/A 04/23/2024   Procedure: BIV PACEMAKER GENERATOR CHANGEOUT;  Surgeon: Waddell Danelle ORN, MD;  Location: [facility] INVASIVE CV LAB;  Service: Cardiovascular;  Laterality: N/A;   BIV PACEMAKER INSERTION CRT-P N/A 12/22/2018   Procedure: BIV PACEMAKER INSERTION CRT-P;  Surgeon: Waddell Danelle ORN, MD;  Location: [facility] INVASIVE CV LAB;  Service: Cardiovascular;  Laterality: N/A;   CARDIAC CATHETERIZATION     ESOPHAGOGASTRODUODENOSCOPY (EGD) WITH PROPOFOL  N/A 07/05/2020   Procedure: ESOPHAGOGASTRODUODENOSCOPY (EGD) WITH PROPOFOL ;  Surgeon: Eartha Angelia Sieving, MD;  Location: [facility];  Service: Gastroenterology;  Laterality: N/A;   HEMORRHOID SURGERY     removed a hemorrhoid in the 1970s   KIDNEY STONE SURGERY     remove a kidney stone   Social History    Socioeconomic History   Marital status: Married    Spouse name: Not on file   Number of children: Not on file   Years of education: Not on file   Highest education level: Not on file  Occupational History   Not on file  Tobacco Use   Smoking status: Former    Passive exposure: Past   Smokeless tobacco: Never  Substance and Sexual Activity   Alcohol use: No   Drug use: No   Sexual activity: Not on file  Other Topics Concern   Not on file  Social History Narrative   He is married, has 1-son. He does not smoke, or use alcohol   Social Drivers of Corporate Investment Banker Strain: Low Risk  (11/02/2022)   Received from [facility]   Overall Financial Resource Strain (CARDIA)    Difficulty of Paying Living Expenses: Not hard at all  Food Insecurity: No Food Insecurity (11/02/2022)   Received from [facility]   Hunger Vital Sign    Within the past 12 months, you worried that your food would run out before you got the money to buy more.: Never true    Within the past 12 months, the food you bought just didn't last and you didn't have money to get more.: Never true  Transportation Needs: No Transportation Needs (11/02/2022)   Received from [facility] - Transportation    Lack of Transportation (Medical): No    Lack of Transportation (Non-Medical): No  Physical Activity: Not  on file  Stress: Not on file  Social Connections: Not on file   Family History  Problem Relation Age of Onset   Heart attack Father        in his 51's   Cancer Brother        1/2   Cancer Sister    Heart Problems Brother 46       pacemaker 2/2   No Known Allergies Prior to Admission medications   Medication Sig Start Date End Date Taking? Authorizing Provider  acetaminophen  (TYLENOL ) 325 MG tablet Take 650 mg by mouth every 6 (six) hours as needed.    [provider]  apixaban  (ELIQUIS ) 2.5 MG TABS tablet Take 1 tablet (2.5 mg total) by mouth 2 (two) times daily.  10/21/23   Vivienne Lonni Ingle, NP  cetirizine (ZYRTEC) 10 MG tablet Take 10 mg by mouth daily.    [provider]  furosemide  (LASIX ) 20 MG tablet TAKE 1 TO 2 TABLETS DAILY AS DIRECTED (ALTERNATING DAYS) 08/01/23   Rolan Ezra RAMAN, MD  metoprolol  succinate (TOPROL  XL) 25 MG 24 hr tablet Take 0.5 tablets (12.5 mg total) by mouth daily. 08/30/23   Rolan Ezra RAMAN, MD  pantoprazole  (PROTONIX ) 40 MG tablet TAKE 1 TABLET DAILY 12/25/22   Carlan, Chelsea L, NP  polyethylene glycol powder (MIRALAX) 17 GM/SCOOP powder Take 17 g by mouth daily as needed for moderate constipation.    [provider]  simvastatin  (ZOCOR ) 40 MG tablet TAKE ONE TABLET DAILY AT [facility] 08/05/23   Rolan Ezra RAMAN, MD  spironolactone  (ALDACTONE ) 25 MG tablet TAKE (1/2) TABLET DAILY. 08/20/23   Rolan Ezra RAMAN, MD  vitamin B-12 (CYANOCOBALAMIN ) 500 MCG tablet Take 1 tablet (500 mcg total) by mouth daily. 07/05/20   Evonnie Lenis, MD    ROS: All other systems have been reviewed and were otherwise negative with the exception of those mentioned in the HPI and as above.  Physical Exam: General: Alert, no acute distress Cardiovascular: No pedal edema Respiratory: No cyanosis, no use of accessory musculature GI: No organomegaly, abdomen is soft and non-tender Skin: No lesions in the area of chief complaint Neurologic: Sensation intact distally Psychiatric: Patient is competent for consent with normal mood and affect Lymphatic: No axillary or cervical lymphadenopathy  MUSCULOSKELETAL:  RUE: swelling and ecchymosis present. ROM not tested in setting of known fracture. NVI.  Imaging: Xrays demonstrate 100% displacement of the proximal humerus fracture  BMI: Estimated body mass index is 24.79 kg/m as calculated from the following:   Height as of 07/27/24: 5' 4 (1.626 m).   Weight as of 07/27/24: 65.5 kg.  Lab Results  Component Value Date   ALBUMIN 3.7 07/02/2020   Diabetes: Patient does not have a  diagnosis of diabetes.     Smoking Status:       Assessment: Fracture of humeral head, right  Plan: Plan for Procedure(s): ARTHROPLASTY, SHOULDER, TOTAL, REVERSE OPEN REDUCTION INTERNAL FIXATION (ORIF) PROXIMAL HUMERUS FRACTURE  The risks benefits and alternatives were discussed with the patient including but not limited to the risks of nonoperative treatment, versus surgical intervention including infection, bleeding, nerve injury,  blood clots, cardiopulmonary complications, morbidity, mortality, among others, and they were willing to proceed.   We additionally specifically discussed risks of axillary nerve injury, infection, periprosthetic fracture, continued pain and longevity of implants prior to beginning procedure.    Patient will be closely monitored in PACU for medical stabilization and pain control. If found stable in PACU, patient may  be discharged home with outpatient follow-up. If any concerns regarding patient's stabilization patient will be admitted for observation after surgery. The patient is planning to be discharged home with outpatient PT.   The patient acknowledged the explanation, agreed to proceed with the plan and consent was signed.   Operative Plan: Right reverse total shoulder arthroplasty for fracture Discharge Medications: standard DVT Prophylaxis: aspirin  Physical Therapy: outpatient Special Discharge needs: Sling   Aleck LOISE Stalling, PA-C  09/09/2024 11:35 AM

## 2024-09-10 ENCOUNTER — Ambulatory Visit (HOSPITAL_COMMUNITY): Payer: Self-pay | Admitting: Physician Assistant

## 2024-09-10 ENCOUNTER — Encounter (HOSPITAL_COMMUNITY): Payer: Self-pay | Admitting: Orthopaedic Surgery

## 2024-09-10 ENCOUNTER — Observation Stay (HOSPITAL_COMMUNITY)

## 2024-09-10 ENCOUNTER — Inpatient Hospital Stay (HOSPITAL_COMMUNITY)
Admission: RE | Admit: 2024-09-10 | Discharge: 2024-09-14 | DRG: 483 | Disposition: A | Discharge status: Skilled Nursing Facility | Attending: Orthopaedic Surgery | Admitting: Orthopaedic Surgery

## 2024-09-10 ENCOUNTER — Encounter (HOSPITAL_COMMUNITY)
Admission: RE | Disposition: A | Discharge status: Skilled Nursing Facility | Payer: Self-pay | Source: Home / Self Care | Attending: Orthopaedic Surgery

## 2024-09-10 ENCOUNTER — Inpatient Hospital Stay (HOSPITAL_COMMUNITY): Payer: Self-pay | Admitting: Physician Assistant

## 2024-09-10 DIAGNOSIS — W19XXXA Unspecified fall, initial encounter: Secondary | ICD-10-CM | POA: Diagnosis present

## 2024-09-10 DIAGNOSIS — G8918 Other acute postprocedural pain: Secondary | ICD-10-CM | POA: Diagnosis not present

## 2024-09-10 DIAGNOSIS — Z87891 Personal history of nicotine dependence: Secondary | ICD-10-CM

## 2024-09-10 DIAGNOSIS — E785 Hyperlipidemia, unspecified: Secondary | ICD-10-CM | POA: Diagnosis present

## 2024-09-10 DIAGNOSIS — Z96611 Presence of right artificial shoulder joint: Secondary | ICD-10-CM | POA: Diagnosis not present

## 2024-09-10 DIAGNOSIS — S42291A Other displaced fracture of upper end of right humerus, initial encounter for closed fracture: Principal | ICD-10-CM | POA: Diagnosis present

## 2024-09-10 DIAGNOSIS — Z8249 Family history of ischemic heart disease and other diseases of the circulatory system: Secondary | ICD-10-CM

## 2024-09-10 DIAGNOSIS — Z79899 Other long term (current) drug therapy: Secondary | ICD-10-CM

## 2024-09-10 DIAGNOSIS — M65811 Other synovitis and tenosynovitis, right shoulder: Secondary | ICD-10-CM | POA: Diagnosis present

## 2024-09-10 DIAGNOSIS — Z471 Aftercare following joint replacement surgery: Secondary | ICD-10-CM | POA: Diagnosis not present

## 2024-09-10 DIAGNOSIS — S42201A Unspecified fracture of upper end of right humerus, initial encounter for closed fracture: Secondary | ICD-10-CM | POA: Diagnosis not present

## 2024-09-10 DIAGNOSIS — I2510 Atherosclerotic heart disease of native coronary artery without angina pectoris: Secondary | ICD-10-CM | POA: Diagnosis not present

## 2024-09-10 DIAGNOSIS — Z7901 Long term (current) use of anticoagulants: Secondary | ICD-10-CM

## 2024-09-10 DIAGNOSIS — S42251A Displaced fracture of greater tuberosity of right humerus, initial encounter for closed fracture: Secondary | ICD-10-CM | POA: Diagnosis not present

## 2024-09-10 DIAGNOSIS — Z950 Presence of cardiac pacemaker: Secondary | ICD-10-CM

## 2024-09-10 LAB — SURGICAL PCR SCREEN
MRSA, PCR: NEGATIVE
Staphylococcus aureus: NEGATIVE

## 2024-09-10 SURGERY — ARTHROPLASTY, SHOULDER, TOTAL, REVERSE
Anesthesia: General | Site: Shoulder | Laterality: Right

## 2024-09-10 MED ORDER — ZOLPIDEM TARTRATE 5 MG PO TABS: 5.0000 mg | ORAL_TABLET | Freq: Every evening | ORAL | Status: DC | PRN

## 2024-09-10 MED ORDER — LACTATED RINGERS IV SOLN: INTRAVENOUS | Status: DC

## 2024-09-10 MED ORDER — GABAPENTIN 300 MG PO CAPS
300.0000 mg | ORAL_CAPSULE | Freq: Once | ORAL | Status: AC
  Administered 2024-09-10: 300 mg via ORAL
  Filled 2024-09-10: qty 1

## 2024-09-10 MED ORDER — ONDANSETRON HCL 4 MG/2ML IJ SOLN: 4.0000 mg | Freq: Four times a day (QID) | INTRAMUSCULAR | Status: DC | PRN

## 2024-09-10 MED ORDER — POLYETHYLENE GLYCOL 3350 17 G PO PACK: 17.0000 g | PACK | Freq: Every day | ORAL | Status: DC | PRN

## 2024-09-10 MED ORDER — FENTANYL CITRATE (PF) 100 MCG/2ML IJ SOLN
INTRAMUSCULAR | Status: AC
  Administered 2024-09-10: 50 ug via INTRAVENOUS
  Filled 2024-09-10: qty 2

## 2024-09-10 MED ORDER — METOPROLOL SUCCINATE 12.5 MG HALF TABLET
12.5000 mg | ORAL_TABLET | Freq: Every day | ORAL | Status: DC
  Administered 2024-09-13 – 2024-09-14 (×2): 12.5 mg via ORAL
  Filled 2024-09-10 (×4): qty 1

## 2024-09-10 MED ORDER — PROPOFOL 500 MG/50ML IV EMUL
INTRAVENOUS | Status: DC | PRN
  Administered 2024-09-10: 100 ug/kg/min via INTRAVENOUS

## 2024-09-10 MED ORDER — BUPIVACAINE HCL (PF) 0.5 % IJ SOLN
INTRAMUSCULAR | Status: DC | PRN
  Administered 2024-09-10: 10 mL via PERINEURAL

## 2024-09-10 MED ORDER — FENTANYL CITRATE (PF) 100 MCG/2ML IJ SOLN: 50.0000 ug | Freq: Once | INTRAMUSCULAR | Status: AC

## 2024-09-10 MED ORDER — 0.9 % SODIUM CHLORIDE (POUR BTL) OPTIME
TOPICAL | Status: DC | PRN
  Administered 2024-09-10: 1000 mL

## 2024-09-10 MED ORDER — BISACODYL 10 MG RE SUPP: 10.0000 mg | Freq: Every day | RECTAL | Status: DC | PRN

## 2024-09-10 MED ORDER — SPIRONOLACTONE 12.5 MG HALF TABLET
12.5000 mg | ORAL_TABLET | Freq: Every day | ORAL | Status: DC
  Administered 2024-09-11 – 2024-09-14 (×3): 12.5 mg via ORAL
  Filled 2024-09-10 (×4): qty 1

## 2024-09-10 MED ORDER — ONDANSETRON HCL 4 MG PO TABS: 4.0000 mg | ORAL_TABLET | Freq: Four times a day (QID) | ORAL | Status: DC | PRN

## 2024-09-10 MED ORDER — SODIUM CHLORIDE 0.9 % IR SOLN
Status: DC | PRN
  Administered 2024-09-10: 3000 mL

## 2024-09-10 MED ORDER — CHLORHEXIDINE GLUCONATE 0.12 % MT SOLN
15.0000 mL | Freq: Once | OROMUCOSAL | Status: AC
  Administered 2024-09-10: 15 mL via OROMUCOSAL
  Filled 2024-09-10: qty 15

## 2024-09-10 MED ORDER — MORPHINE SULFATE (PF) 2 MG/ML IV SOLN: 0.5000 mg | INTRAVENOUS | Status: DC | PRN

## 2024-09-10 MED ORDER — MENTHOL 3 MG MT LOZG: 1.0000 | LOZENGE | OROMUCOSAL | Status: DC | PRN

## 2024-09-10 MED ORDER — AMISULPRIDE (ANTIEMETIC) 5 MG/2ML IV SOLN: 10.0000 mg | Freq: Once | INTRAVENOUS | Status: DC | PRN

## 2024-09-10 MED ORDER — CEFAZOLIN SODIUM-DEXTROSE 2-4 GM/100ML-% IV SOLN
2.0000 g | INTRAVENOUS | Status: AC
  Administered 2024-09-10: 2 g via INTRAVENOUS
  Filled 2024-09-10: qty 100

## 2024-09-10 MED ORDER — ONDANSETRON HCL 4 MG/2ML IJ SOLN
INTRAMUSCULAR | Status: DC | PRN
  Administered 2024-09-10: 4 mg via INTRAVENOUS

## 2024-09-10 MED ORDER — DIPHENHYDRAMINE HCL 12.5 MG/5ML PO ELIX: 12.5000 mg | ORAL_SOLUTION | ORAL | Status: DC | PRN

## 2024-09-10 MED ORDER — PROPOFOL 10 MG/ML IV BOLUS
INTRAVENOUS | Status: DC | PRN
  Administered 2024-09-10: 50 mg via INTRAVENOUS

## 2024-09-10 MED ORDER — VANCOMYCIN HCL 1000 MG IV SOLR
INTRAVENOUS | Status: DC | PRN
  Administered 2024-09-10: 1000 mg via TOPICAL

## 2024-09-10 MED ORDER — APIXABAN 2.5 MG PO TABS
2.5000 mg | ORAL_TABLET | Freq: Two times a day (BID) | ORAL | Status: DC
  Administered 2024-09-11 – 2024-09-14 (×7): 2.5 mg via ORAL
  Filled 2024-09-10 (×7): qty 1

## 2024-09-10 MED ORDER — LORATADINE 10 MG PO TABS
10.0000 mg | ORAL_TABLET | Freq: Every day | ORAL | Status: DC
  Administered 2024-09-10 – 2024-09-14 (×5): 10 mg via ORAL
  Filled 2024-09-10 (×5): qty 1

## 2024-09-10 MED ORDER — PHENOL 1.4 % MT LIQD: 1.0000 | OROMUCOSAL | Status: DC | PRN

## 2024-09-10 MED ORDER — SIMVASTATIN 20 MG PO TABS
40.0000 mg | ORAL_TABLET | Freq: Every day | ORAL | Status: DC
  Administered 2024-09-10 – 2024-09-13 (×4): 40 mg via ORAL
  Filled 2024-09-10 (×4): qty 2

## 2024-09-10 MED ORDER — PANTOPRAZOLE SODIUM 40 MG PO TBEC
40.0000 mg | DELAYED_RELEASE_TABLET | Freq: Every day | ORAL | Status: DC
  Administered 2024-09-10 – 2024-09-14 (×5): 40 mg via ORAL
  Filled 2024-09-10 (×5): qty 1

## 2024-09-10 MED ORDER — FENTANYL CITRATE (PF) 100 MCG/2ML IJ SOLN: 25.0000 ug | INTRAMUSCULAR | Status: DC | PRN

## 2024-09-10 MED ORDER — BUPIVACAINE LIPOSOME 1.3 % IJ SUSP
INTRAMUSCULAR | Status: DC | PRN
  Administered 2024-09-10: 10 mL via PERINEURAL

## 2024-09-10 MED ORDER — STERILE WATER FOR IRRIGATION IR SOLN
Status: DC | PRN
  Administered 2024-09-10: 1000 mL

## 2024-09-10 MED ORDER — ALBUMIN HUMAN 5 % IV SOLN: INTRAVENOUS | Status: DC | PRN

## 2024-09-10 MED ORDER — ACETAMINOPHEN 500 MG PO TABS
1000.0000 mg | ORAL_TABLET | Freq: Once | ORAL | Status: AC
  Administered 2024-09-10: 1000 mg via ORAL
  Filled 2024-09-10: qty 2

## 2024-09-10 MED ORDER — EPHEDRINE SULFATE-NACL 50-0.9 MG/10ML-% IV SOSY
PREFILLED_SYRINGE | INTRAVENOUS | Status: DC | PRN
  Administered 2024-09-10 (×2): 5 mg via INTRAVENOUS

## 2024-09-10 MED ORDER — SUGAMMADEX SODIUM 200 MG/2ML IV SOLN
INTRAVENOUS | Status: DC | PRN
  Administered 2024-09-10: 200 mg via INTRAVENOUS

## 2024-09-10 MED ORDER — FENTANYL CITRATE (PF) 100 MCG/2ML IJ SOLN
INTRAMUSCULAR | Status: AC
  Filled 2024-09-10: qty 2

## 2024-09-10 MED ORDER — LIDOCAINE 2% (20 MG/ML) 5 ML SYRINGE
INTRAMUSCULAR | Status: DC | PRN
  Administered 2024-09-10: 100 mg via INTRAVENOUS

## 2024-09-10 MED ORDER — OXYCODONE HCL 5 MG PO TABS: 5.0000 mg | ORAL_TABLET | Freq: Once | ORAL | Status: DC | PRN

## 2024-09-10 MED ORDER — TRANEXAMIC ACID-NACL 1000-0.7 MG/100ML-% IV SOLN
1000.0000 mg | INTRAVENOUS | Status: AC
  Administered 2024-09-10: 1000 mg via INTRAVENOUS
  Filled 2024-09-10: qty 100

## 2024-09-10 MED ORDER — METOPROLOL SUCCINATE ER 25 MG PO TB24: 12.5000 mg | ORAL_TABLET | Freq: Once | ORAL | Status: AC

## 2024-09-10 MED ORDER — PHENYLEPHRINE HCL-NACL 20-0.9 MG/250ML-% IV SOLN
INTRAVENOUS | Status: DC | PRN
  Administered 2024-09-10: 50 ug/min via INTRAVENOUS

## 2024-09-10 MED ORDER — ORAL CARE MOUTH RINSE: 15.0000 mL | Freq: Once | OROMUCOSAL | Status: AC

## 2024-09-10 MED ORDER — ACETAMINOPHEN 500 MG PO TABS
500.0000 mg | ORAL_TABLET | Freq: Four times a day (QID) | ORAL | Status: AC
  Administered 2024-09-10 – 2024-09-11 (×3): 500 mg via ORAL
  Filled 2024-09-10 (×3): qty 1

## 2024-09-10 MED ORDER — HYDROCODONE-ACETAMINOPHEN 5-325 MG PO TABS: 1.0000 | ORAL_TABLET | ORAL | Status: DC | PRN

## 2024-09-10 MED ORDER — ROCURONIUM BROMIDE 10 MG/ML (PF) SYRINGE
PREFILLED_SYRINGE | INTRAVENOUS | Status: DC | PRN
  Administered 2024-09-10: 50 mg via INTRAVENOUS

## 2024-09-10 MED ORDER — VANCOMYCIN HCL 1000 MG IV SOLR
INTRAVENOUS | Status: AC
Start: 2024-09-10 — End: 2024-09-10
  Filled 2024-09-10: qty 20

## 2024-09-10 MED ORDER — HYDROCODONE-ACETAMINOPHEN 7.5-325 MG PO TABS: 1.0000 | ORAL_TABLET | ORAL | Status: DC | PRN

## 2024-09-10 MED ORDER — CEFAZOLIN SODIUM-DEXTROSE 2-4 GM/100ML-% IV SOLN
2.0000 g | Freq: Four times a day (QID) | INTRAVENOUS | Status: AC
  Administered 2024-09-10 – 2024-09-11 (×2): 2 g via INTRAVENOUS
  Filled 2024-09-10 (×2): qty 100

## 2024-09-10 MED ORDER — PROPOFOL 10 MG/ML IV BOLUS
INTRAVENOUS | Status: AC
  Filled 2024-09-10: qty 20

## 2024-09-10 MED ORDER — METOPROLOL SUCCINATE ER 25 MG PO TB24
ORAL_TABLET | ORAL | Status: AC
  Administered 2024-09-10: 12.5 mg via ORAL
  Filled 2024-09-10: qty 1

## 2024-09-10 MED ORDER — OXYCODONE HCL 5 MG/5ML PO SOLN: 5.0000 mg | Freq: Once | ORAL | Status: DC | PRN

## 2024-09-10 MED ORDER — ACETAMINOPHEN 500 MG PO TABS: 1000.0000 mg | ORAL_TABLET | Freq: Once | ORAL | Status: DC

## 2024-09-10 MED ORDER — DOCUSATE SODIUM 100 MG PO CAPS
100.0000 mg | ORAL_CAPSULE | Freq: Two times a day (BID) | ORAL | Status: DC
Start: 2024-09-10 — End: 2024-09-14
  Administered 2024-09-11 – 2024-09-14 (×7): 100 mg via ORAL
  Filled 2024-09-10 (×9): qty 1

## 2024-09-10 SURGICAL SUPPLY — 79 items
4.26 X 28 (Screw) IMPLANT
BAG COUNTER SPONGE SURGICOUNT (BAG) ×3 IMPLANT
BASEPLATE GLENOSPHERE 25 STD (Miscellaneous) IMPLANT
BIT DRILL 3.2 PERIPHERAL SCREW (BIT) IMPLANT
BLADE SAW SGTL 73X25 THK (BLADE) ×3 IMPLANT
BNDG ELASTIC 4INX 5YD STR LF (GAUZE/BANDAGES/DRESSINGS) IMPLANT
CHLORAPREP W/TINT 26 (MISCELLANEOUS) ×6 IMPLANT
COVER SURGICAL LIGHT HANDLE (MISCELLANEOUS) ×6 IMPLANT
DRAPE C-ARM 42X72 X-RAY (DRAPES) ×3 IMPLANT
DRAPE HALF SHEET 40X57 (DRAPES) ×3 IMPLANT
DRAPE IMP U-DRAPE 54X76 (DRAPES) ×3 IMPLANT
DRAPE INCISE IOBAN 66X45 STRL (DRAPES) ×6 IMPLANT
DRAPE SURG 17X23 STRL (DRAPES) ×3 IMPLANT
DRAPE SURG ORHT 6 SPLT 77X108 (DRAPES) ×6 IMPLANT
DRAPE SWITCH (DRAPES) ×3 IMPLANT
DRAPE U-SHAPE 47X51 STRL (DRAPES) ×3 IMPLANT
DRSG AQUACEL AG ADV 3.5X 6 (GAUZE/BANDAGES/DRESSINGS) ×3 IMPLANT
DRSG AQUACEL AG ADV 3.5X10 (GAUZE/BANDAGES/DRESSINGS) IMPLANT
DRSG MEPILEX POST OP 4X8 (GAUZE/BANDAGES/DRESSINGS) IMPLANT
ELECTRODE BLDE 4.0 EZ CLN MEGD (MISCELLANEOUS) IMPLANT
ELECTRODE REM PT RTRN 9FT ADLT (ELECTROSURGICAL) ×3 IMPLANT
GAUZE SPONGE 4X4 12PLY STRL (GAUZE/BANDAGES/DRESSINGS) IMPLANT
GAUZE XEROFORM 5X9 LF (GAUZE/BANDAGES/DRESSINGS) IMPLANT
GLENOSPHERE STD 39 (Joint) IMPLANT
GLOVE BIO SURGEON STRL SZ 6.5 (GLOVE) ×3 IMPLANT
GLOVE BIOGEL PI IND STRL 8 (GLOVE) ×3 IMPLANT
GLOVE ECLIPSE 8.0 STRL XLNG CF (GLOVE) ×6 IMPLANT
GLOVE INDICATOR 6.5 STRL GRN (GLOVE) ×3 IMPLANT
GOWN STRL REUS W/ TWL LRG LVL3 (GOWN DISPOSABLE) ×9 IMPLANT
GOWN STRL REUS W/ TWL XL LVL3 (GOWN DISPOSABLE) ×3 IMPLANT
GUIDEWIRE GLENOID 2.5X220 (WIRE) IMPLANT
INSERT VE 10D PERFORM 39 (Insert) IMPLANT
INSERT VE 10D PERFORM 39MM (Insert) ×2 IMPLANT
KIT BASIN OR (CUSTOM PROCEDURE TRAY) ×3 IMPLANT
KIT STABILIZATION SHOULDER (MISCELLANEOUS) ×3 IMPLANT
KIT TURNOVER KIT B (KITS) ×3 IMPLANT
MANIFOLD NEPTUNE II (INSTRUMENTS) ×3 IMPLANT
NDL 22X1.5 STRL (OR ONLY) (MISCELLANEOUS) IMPLANT
NDL HYPO 25GX1X1/2 BEV (NEEDLE) IMPLANT
NDL MAYO TROCAR (NEEDLE) IMPLANT
NDL SUT .5 MAYO 1.404X.05X (NEEDLE) ×3 IMPLANT
NDL SUT 2 .5 CRC MAYO 1.732X (NEEDLE) ×3 IMPLANT
NEEDLE 22X1.5 STRL (OR ONLY) (MISCELLANEOUS) IMPLANT
NEEDLE HYPO 25GX1X1/2 BEV (NEEDLE) IMPLANT
NEEDLE MAYO TROCAR (NEEDLE) ×2 IMPLANT
PACK SHOULDER (CUSTOM PROCEDURE TRAY) ×3 IMPLANT
PAD ARMBOARD POSITIONER FOAM (MISCELLANEOUS) ×6 IMPLANT
PADDING CAST ABS COTTON 4X4 ST (CAST SUPPLIES) IMPLANT
RESTRAINT HEAD UNIVERSAL NS (MISCELLANEOUS) ×3 IMPLANT
SCREW 4.26 X 34 (Screw) ×2 IMPLANT
SCREW 5.0X18 (Screw) IMPLANT
SCREW 5.5X14 (Screw) IMPLANT
SCREW BONE 6.5X40 SM (Screw) IMPLANT
SCREW PERFORM 1X34 SH (Screw) IMPLANT
SCREW PERIPHERAL 5.0X34 (Screw) IMPLANT
SET HNDPC FAN SPRY TIP SCT (DISPOSABLE) ×3 IMPLANT
SLING ARM IMMOBILIZER LRG (SOFTGOODS) ×3 IMPLANT
SOLN 0.9% NACL POUR BTL 1000ML (IV SOLUTION) ×3 IMPLANT
SOLN STERILE WATER BTL 1000 ML (IV SOLUTION) ×3 IMPLANT
SPACER BODY HUM RSA 1/2 9 (Spacer) IMPLANT
SPONGE T-LAP 18X18 ~~LOC~~+RFID (SPONGE) ×6 IMPLANT
STAPLER SKIN PROX 35W (STAPLE) ×3 IMPLANT
STEM PERFORM FX 16X130 LT (Stem) IMPLANT
STRIP CLOSURE SKIN 1/2X4 (GAUZE/BANDAGES/DRESSINGS) ×3 IMPLANT
SUCTION TUBE FRAZIER 10FR DISP (SUCTIONS) ×3 IMPLANT
SUT ETHIBOND 2 V 37 (SUTURE) ×3 IMPLANT
SUT ETHIBOND NAB CT1 #1 30IN (SUTURE) ×3 IMPLANT
SUT ETHILON 3 0 PS 1 (SUTURE) IMPLANT
SUT MNCRL AB 3-0 PS2 18 (SUTURE) ×3 IMPLANT
SUT VIC AB 0 CT1 36 (SUTURE) IMPLANT
SUT VIC AB 2-0 CT1 TAPERPNT 27 (SUTURE) ×6 IMPLANT
SUTURE FIBERWR #5 38 CONV NDL (SUTURE) IMPLANT
SYR CONTROL 10ML LL (SYRINGE) IMPLANT
TOWEL GREEN STERILE (TOWEL DISPOSABLE) ×3 IMPLANT
TOWEL GREEN STERILE FF (TOWEL DISPOSABLE) ×3 IMPLANT
TRAY FOLEY W/BAG SLVR 14FR (SET/KITS/TRAYS/PACK) IMPLANT
TUBE CONNECTING 12X1/4 (SUCTIONS) ×3 IMPLANT
VE 10 degree Reversed Insert, 65% VE - UHWMPE + Ti ×2 IMPLANT
YANKAUER SUCT BULB TIP NO VENT (SUCTIONS) ×3 IMPLANT

## 2024-09-10 NOTE — Op Note (Signed)
 Orthopaedic Surgery Operative Note (CSN: [id number])  Mark Lane  01-30-1922 Date of Surgery: 09/10/2024   Diagnoses:  Right humeral head fracture, right proximal humerus fracture  Procedure: Right reverse total Shoulder Arthroplasty [phone] Open reduction term fixation of tuberosities 23630   Operative Finding Successful completion of planned procedure.  Patient had significant displacement of his fracture with near 100% displacement.  The head was posteriorly located and the shaft was anterior.  His proximal humeral canal was quite wide and we used 2 interlocking screws to avoid rotation of the implant.  Good fit and stability was noted.  Patient will use his arm immediately for ADLs as well as weightbearing with a walker.  Post-operative plan: The patient will be NWB in sling until nerve block wears off and can use arm for ADLs and weightbearing for walker use.  The patient will be will be admitted to observation due to medical complexity, monitoring and pain management.  DVT prophylaxis Aspirin  81 mg twice daily for 6 weeks.  Pain control with PRN pain medication preferring oral medicines.  Follow up plan will be scheduled in approximately 7 days for incision check and XR.  Physical therapy to start for mobility immediately though no reason to use the arm for range of motion with therapy until the first month is complete.  Sling can be discontinued after nerve block wears off.  Implants: Tornier perform humeral fracture size 16 stem, 9 mm tray, +3 10 degree 65% capture polyethylene, 39 standard glenosphere with a 25+3 baseplate, 40 center screw and 4 peripheral locking screws  Post-Op Diagnosis: Same Surgeons:Primary: Cristy Bonner DASEN, MD Assistants:Caroline McBane, PA-C Location: [facility] OR ROOM 05 Anesthesia: General with Exparel Interscalene Antibiotics: Ancef  2g preop, Vancomycin 1000mg  locally Tourniquet time: None Estimated Blood Loss: 100 Complications: None Specimens:  None Implants: Implant Name Type Inv. Item Serial No. Manufacturer Lot No. LRB No. Used Action  BASEPLATE GLENOSPHERE 25 STD - [id number] Miscellaneous BASEPLATE GLENOSPHERE 25 STD [id number] [employer]  Right 1 Implanted  GLENOSPHERE STD 39 - [id number] Joint GLENOSPHERE STD 39 [id number] [employer]  Right 1 Implanted  SCREW BONE 6.5X40 SM - [id number] Screw SCREW BONE 6.5X40 SM  [employer]  Right 1 Implanted  SCREW PERIPHERAL 5.0X34 - [id number] Screw SCREW PERIPHERAL 5.0X34  [employer]  Right 1 Implanted  SCREW 5.0X18 - [id number] Screw SCREW 5.0X18  [employer]  Right 2 Implanted  SCREW 5.5X14 - [id number] Screw SCREW 5.5X14  [employer]  Right 1 Implanted  SPACER BODY HUM RSA 1/2 9 - [id number] Spacer SPACER BODY HUM RSA 1/2 9 [id number] [employer]  Right 1 Implanted  STEM PERFORM FX 16X130 LT - [id number] Stem STEM PERFORM FX 16X130 LT [id number] [employer]  Right 1 Implanted  SCREW 4.26 X 34 Screw   [employer]  Right 1 Implanted  4.26 X 28 Screw   [employer]  Right 1 Implanted  VE 10 degree Reversed Insert, 65% VE - [id number]   [id number] [employer]  Right 1 Implanted    Indications for Surgery:   Mark Lane is a 89 y.o. male with approximately wrist fracture with significant displacement.  Benefits and risks of operative and nonoperative management were discussed prior to surgery with patient/guardian(s) and informed consent form was completed.  Infection and need for further surgery were discussed as was prosthetic stability and cuff issues.  We additionally specifically discussed risks of axillary nerve injury, infection, periprosthetic fracture, continued pain and longevity of implants prior to beginning  procedure.      Procedure:   The patient was identified in the preoperative holding area where the surgical site was marked. Block placed by anesthesia with exparel.  The patient was taken to the OR where a procedural timeout was called and the above noted  anesthesia was induced.  The patient was positioned beachchair on allen table with spider arm positioner.  Preoperative antibiotics were dosed.  The patient's right shoulder was prepped and draped in the usual sterile fashion.  A second preoperative timeout was called.       Standard deltopectoral approach was performed with a #10 blade. We dissected down to the subcutaneous tissues and the cephalic vein was taken laterally with the deltoid. Clavipectoral fascia was incised in line with the incision. Deep retractors were placed. The long of the biceps tendon was identified and there was significant tenosynovitis present.  Tenodesis was performed to the pectoralis tendon with #2 Ethibond. The remaining biceps was followed up into the rotator interval where it was released.    We used the bicipital groove as a landmark for the lesser and greater tuberosity fragments.  We were able to mobilize the lesser tuberosity fragment and placed stay sutures in the bone tendon junction to help with mobilization.  This point we were able to identify the  greater tuberosity fragment and 4 #5  FiberWire sutures were used to place into this for eventual repair of the tuberosities.  Once these were both mobilized we took care to identify the shaft fragment as well as the head fragment.  We carefully identified the head fragment were able to manually remove it.  At this point the axillary nerve was found and palpated and with a tug test noted to be intact.  Protected throughout the remainder of the case with blunt retractors.    We then released the SGHL with bovie cautery prior to placing a curved mayo at the junction of the anterior glenoid well above the axillary nerve and bluntly dissecting the subscapularis from the capsule.  We then carefully protected the axillary nerve as we gently released the inferior capsule to fully mobilize the subscapularis.  An anterior deltoid retractor was then placed as well as a small Hohmann  retractor superiorly.   The glenoid was relatively preserved as we would expect in this fracture patient.  The remaining labrum was removed circumferentially taking great care not to disrupt the posterior capsule.    The glenoid drill guide was placed and used to drill a guide pin in the center, inferior position. The glenoid face was then reamed concentrically over the guide wire. The center hole was drilled over the guidepin in a near anatomic angle of version. Next the glenoid vault was drilled back and we placed 2 locking screws.  The base plate was screwed into the glenoid vault obtaining secure fixation. We next placed superior and inferior locking screws for additional fixation.  Next the above size glenosphere was selected and impacted onto the baseplate. The center screw was tightened.   We then repositioned the arm to give access to the humeral shaft fragment.  Drill holes were placed and fiberwire sutures in the shaft for vertical fixation of the tuberosities.  We broached with the Perform humeral fracture stem implants  up to size listed above which obtained an appropriate fit.    We tried multiple options for sizes settling on the above.  The shoulder was trialed.  There was good ROM in all planes and the shoulder  was stable with no inferior translation.  We felt that due to the patient's wide proximal diaphysis and need to use the arm immediately we felt that interlocking screws to be appropriate.  We placed 2 interlock screws based on the jig system included with the perform humeral reverse.  I was able to get good purchase with these.  Rotational stability was controlled.   We then mobilized the tuberosities again and placed the anterior deep limbs of the 4 #5 fiber wires around the stem.  1 of these was tied down fixing the greater tuberosity in place after bone graft harvest from the humeral head component was placed underneath.   The joint was reduced and thoroughly irrigated with  pulsatile lavage. The remaining sutures were then placed through the subscapularis and the bone tendon junction and the tuberosities were reduced after bone graft placed beneath as autograft at the subscap.  We horizontally secured the tuberosities before placing vertical fixation with the suture that was placed into the shaft.  Tuberosities moved as a unit were happy with the overall reduction.    We irrigated copiously at this point.  Hemostasis was obtained. The deltopectoral interval was reapproximated with #1 Ethibond. The subcutaneous tissues were closed with 3-0 Vicryl and the skin was closed with running nylon.     Aleck Stalling, PA-C, present and scrubbed throughout the case, critical for completion in a timely fashion, and for retraction, instrumentation, closure.

## 2024-09-10 NOTE — Anesthesia Procedure Notes (Signed)
 Procedure Name: Intubation Date/Time: 09/10/2024 12:15 PM  Performed by: Virgil Ee, CRNAPre-anesthesia Checklist: Patient identified, Patient being monitored, Timeout performed, Emergency Drugs available and Suction available Patient Re-evaluated:Patient Re-evaluated prior to induction Oxygen Delivery Method: Circle system utilized Preoxygenation: Pre-oxygenation with 100% oxygen Induction Type: IV induction Ventilation: Mask ventilation without difficulty Laryngoscope Size: Mac and 4 Grade View: Grade I Tube type: Oral Tube size: 6.5 mm Number of attempts: 1 Airway Equipment and Method: Stylet Placement Confirmation: ETT inserted through vocal cords under direct vision, positive ETCO2 and breath sounds checked- equal and bilateral Secured at: 22 cm Tube secured with: Tape Dental Injury: Teeth and Oropharynx as per pre-operative assessment

## 2024-09-10 NOTE — Telephone Encounter (Signed)
 Left message x 2 to surgery scheduler. Left message inquiring if clearance still needed or did pt have surgery today as planned, asked for a call back to our office to let us  know if still clearance.

## 2024-09-10 NOTE — Transfer of Care (Signed)
 Immediate Anesthesia Transfer of Care Note  Patient: Mark Lane  Procedure(s) Performed: ARTHROPLASTY, SHOULDER, TOTAL, REVERSE (Right: Shoulder)  Patient Location: PACU  Anesthesia Type:General  Level of Consciousness: drowsy and responds to stimulation  Airway & Oxygen Therapy: Patient Spontanous Breathing and Patient connected to face mask oxygen  Post-op Assessment: Report given to RN and Post -op Vital signs reviewed and stable  Post vital signs: Reviewed and stable  Last Vitals:  Vitals Value Taken Time  BP 125/61 09/10/24 14:30  Temp 36.7 C 09/10/24 14:26  Pulse 61 09/10/24 14:33  Resp 16 09/10/24 14:33  SpO2 100 % 09/10/24 14:33  Vitals shown include unfiled device data.  Last Pain:  Vitals:   09/10/24 1430  TempSrc:   PainSc: Asleep         Complications: No notable events documented.

## 2024-09-10 NOTE — Anesthesia Procedure Notes (Signed)
 Anesthesia Regional Block: Interscalene brachial plexus block   Pre-Anesthetic Checklist: , timeout performed,  Correct Patient, Correct Site, Correct Laterality,  Correct Procedure, Correct Position, site marked,  Risks and benefits discussed,  Surgical consent,  Pre-op evaluation,  At surgeon's request and post-op pain management  Laterality: Right  Prep: chloraprep       Needles:  Injection technique: Single-shot  Needle Type: Echogenic Stimulator Needle     Needle Length: 9cm  Needle Gauge: 21     Additional Needles:   Procedures:,,,, ultrasound used (permanent image in chart),,    Narrative:  Start time: 09/10/2024 11:05 AM End time: 09/10/2024 11:10 AM Injection made incrementally with aspirations every 5 mL.  Performed by: Personally  Anesthesiologist: Peggye Delon Brunswick, MD  Additional Notes: Discussed risks and benefits of nerve block including, but not limited to, prolonged and/or permanent nerve injury involving sensory and/or motor function. Monitors were applied and a time-out was performed. The nerve and associated structures were visualized under ultrasound guidance. After negative aspiration, local anesthetic was slowly injected around the nerve. There was no evidence of high pressure during the procedure. There were no paresthesias. VSS remained stable and the patient tolerated the procedure well.

## 2024-09-10 NOTE — Interval H&P Note (Signed)
 Due to the displayed nature of the patient's fracture, despite his age, I feel he would be better served with fixation as he is not able to mobilize and the fracture is almost assuredly going to fall into non-union. He has a higher than normal risk, obviously based on his age however, I think this is necessary. Family is understanding of the plan.

## 2024-09-11 DIAGNOSIS — Z87891 Personal history of nicotine dependence: Secondary | ICD-10-CM | POA: Diagnosis not present

## 2024-09-11 DIAGNOSIS — Z950 Presence of cardiac pacemaker: Secondary | ICD-10-CM | POA: Diagnosis not present

## 2024-09-11 DIAGNOSIS — E785 Hyperlipidemia, unspecified: Secondary | ICD-10-CM | POA: Diagnosis not present

## 2024-09-11 DIAGNOSIS — L089 Local infection of the skin and subcutaneous tissue, unspecified: Secondary | ICD-10-CM | POA: Diagnosis not present

## 2024-09-11 DIAGNOSIS — M6259 Muscle wasting and atrophy, not elsewhere classified, multiple sites: Secondary | ICD-10-CM | POA: Diagnosis not present

## 2024-09-11 DIAGNOSIS — K5909 Other constipation: Secondary | ICD-10-CM | POA: Diagnosis not present

## 2024-09-11 DIAGNOSIS — W19XXXA Unspecified fall, initial encounter: Secondary | ICD-10-CM | POA: Diagnosis present

## 2024-09-11 DIAGNOSIS — S42291A Other displaced fracture of upper end of right humerus, initial encounter for closed fracture: Secondary | ICD-10-CM | POA: Diagnosis not present

## 2024-09-11 DIAGNOSIS — Z79899 Other long term (current) drug therapy: Secondary | ICD-10-CM | POA: Diagnosis not present

## 2024-09-11 DIAGNOSIS — R41841 Cognitive communication deficit: Secondary | ICD-10-CM | POA: Diagnosis not present

## 2024-09-11 DIAGNOSIS — R296 Repeated falls: Secondary | ICD-10-CM | POA: Diagnosis not present

## 2024-09-11 DIAGNOSIS — E7849 Other hyperlipidemia: Secondary | ICD-10-CM | POA: Diagnosis not present

## 2024-09-11 DIAGNOSIS — Z8249 Family history of ischemic heart disease and other diseases of the circulatory system: Secondary | ICD-10-CM | POA: Diagnosis not present

## 2024-09-11 DIAGNOSIS — S42201D Unspecified fracture of upper end of right humerus, subsequent encounter for fracture with routine healing: Secondary | ICD-10-CM | POA: Diagnosis not present

## 2024-09-11 DIAGNOSIS — I509 Heart failure, unspecified: Secondary | ICD-10-CM | POA: Diagnosis not present

## 2024-09-11 DIAGNOSIS — Z743 Need for continuous supervision: Secondary | ICD-10-CM | POA: Diagnosis not present

## 2024-09-11 DIAGNOSIS — M65811 Other synovitis and tenosynovitis, right shoulder: Secondary | ICD-10-CM | POA: Diagnosis not present

## 2024-09-11 DIAGNOSIS — R2681 Unsteadiness on feet: Secondary | ICD-10-CM | POA: Diagnosis not present

## 2024-09-11 DIAGNOSIS — S42221A 2-part displaced fracture of surgical neck of right humerus, initial encounter for closed fracture: Secondary | ICD-10-CM | POA: Diagnosis not present

## 2024-09-11 DIAGNOSIS — K219 Gastro-esophageal reflux disease without esophagitis: Secondary | ICD-10-CM | POA: Diagnosis not present

## 2024-09-11 DIAGNOSIS — I4819 Other persistent atrial fibrillation: Secondary | ICD-10-CM | POA: Diagnosis not present

## 2024-09-11 DIAGNOSIS — I2510 Atherosclerotic heart disease of native coronary artery without angina pectoris: Secondary | ICD-10-CM | POA: Diagnosis not present

## 2024-09-11 DIAGNOSIS — Z7901 Long term (current) use of anticoagulants: Secondary | ICD-10-CM | POA: Diagnosis not present

## 2024-09-11 DIAGNOSIS — R531 Weakness: Secondary | ICD-10-CM | POA: Diagnosis not present

## 2024-09-11 DIAGNOSIS — I5022 Chronic systolic (congestive) heart failure: Secondary | ICD-10-CM | POA: Diagnosis not present

## 2024-09-11 DIAGNOSIS — R1310 Dysphagia, unspecified: Secondary | ICD-10-CM | POA: Diagnosis not present

## 2024-09-11 DIAGNOSIS — R262 Difficulty in walking, not elsewhere classified: Secondary | ICD-10-CM | POA: Diagnosis not present

## 2024-09-11 LAB — CBC
HCT: 31.3 % — ABNORMAL LOW (ref 39.0–52.0)
Hemoglobin: 10.2 g/dL — ABNORMAL LOW (ref 13.0–17.0)
MCH: 30.6 pg (ref 26.0–34.0)
MCHC: 32.6 g/dL (ref 30.0–36.0)
MCV: 94 fL (ref 80.0–100.0)
Platelets: 248 K/uL (ref 150–400)
RBC: 3.33 MIL/uL — ABNORMAL LOW (ref 4.22–5.81)
RDW: 13.9 % (ref 11.5–15.5)
WBC: 10.9 K/uL — ABNORMAL HIGH (ref 4.0–10.5)
nRBC: 0 % (ref 0.0–0.2)

## 2024-09-11 NOTE — TOC Initial Note (Addendum)
 Transition of Care [facility]) - Initial/Assessment Note    Patient Details  Name: Mark Lane MRN: [id number] Date of Birth: [birth year]  [facility] CM/SW Contact:    Bridget Cordella Simmonds, LCSW Phone Number: 09/11/2024, 1:38 PM  Clinical Narrative: Pt oriented x3, able to answer basic questions.  All information provided by pt son Harman, also in the room.  Pt from [facility], lives there with his wife.  Normally walk with walker or cane.  Discussed PT recommendation for SNF and they are agreeable.  Per Harman, was at [facility] earlier this year, preference would be to return there.  Referral sent out to [facility] in hub, CSW reached out to [place] to review.                   1430: [facility] does offer, bed will be available Monday.  Son and pt informed.  SNF auth request made with Tina/[facility] for SNF and PTAR.  Expected Discharge Plan: Skilled Nursing Facility Barriers to Discharge: SNF Pending bed offer, Continued Medical Work up   Patient Goals and CMS Choice Patient states their goals for this hospitalization and ongoing recovery are:: get back to [facility]   Choice offered to / list presented to : Patient, Adult Children (son Darrel requesting [facility], pt agreeable to this)      Expected Discharge Plan and Services In-house Referral: Clinical Social Work   Post Acute Care Choice: Skilled Nursing Facility Living arrangements for the past 2 months: Assisted Living Facility ([facility])                                      Prior Living Arrangements/Services Living arrangements for the past 2 months: Assisted Living Facility ([facility]) Lives with:: Facility Resident Patient language and need for interpreter reviewed:: Yes        Need for Family Participation in Patient Care: Yes (Comment) Care giver support system in place?: Yes (comment) Current home services: Other (comment) (na) Criminal Activity/Legal  Involvement Pertinent to Current Situation/Hospitalization: No - Comment as needed  Activities of Daily Living   ADL Screening (condition at time of admission) Independently performs ADLs?: Yes (appropriate for developmental age) Is the patient deaf or have difficulty hearing?: No Does the patient have difficulty seeing, even when wearing glasses/contacts?: No Does the patient have difficulty concentrating, remembering, or making decisions?: No  Permission Sought/Granted         Permission granted to share info w AGENCY: SNF        Emotional Assessment Appearance:: Appears stated age Attitude/Demeanor/Rapport: Engaged Affect (typically observed): Pleasant Orientation: : Oriented to Self, Oriented to Place, Oriented to Situation      Admission diagnosis:  Fracture of humeral head, right, closed, initial encounter [S42.291A] Status post reverse total arthroplasty of right shoulder [Z96.611] Patient Active Problem List   Diagnosis Date Noted   Status post reverse total arthroplasty of right shoulder 09/10/2024   Gastroesophageal reflux disease without esophagitis 12/25/2021   Constipation 12/25/2021   Hypotension 07/02/2020   Failure to thrive in adult 07/02/2020   CKD (chronic kidney disease) stage 3, GFR 30-59 ml/min (HCC) 07/02/2020   Right forearm cellulitis    Biventricular cardiac pacemaker in situ 07/22/2019   Persistent atrial fibrillation (HCC) 07/22/2019   Complete heart block (HCC) 12/22/2018   Cardiomyopathy, ischemic 12/30/2012   ACUT MI OTH LAT WALL SUBSQT EPIS  CARE 05/29/2010   1st degree AV block 02/23/2009   Hyperlipidemia 02/22/2009   Coronary atherosclerosis 02/22/2009   PCP:  Nichole Senior, MD Pharmacy:   [facility] [place], [place] - [address] [place] [address] Phone: [phone] Fax: [phone]     Social Drivers of Health (SDOH) Social History: SDOH Screenings   Food Insecurity: No Food  Insecurity (11/02/2022)   Received from [facility]  Transportation Needs: No Transportation Needs (11/02/2022)   Received from [facility]  Financial Resource Strain: Low Risk  (11/02/2022)   Received from [facility]  Tobacco Use: Medium Risk (09/10/2024)   SDOH Interventions:     Readmission Risk Interventions     No data to display

## 2024-09-11 NOTE — Progress Notes (Signed)
   ORTHOPAEDIC PROGRESS NOTE  s/p Procedure(s): ARTHROPLASTY, SHOULDER, TOTAL, REVERSE  SUBJECTIVE: Reports moderate pain about operative site. Continues to fall back to sleep with conversation. Seems slightly confused this AM.  OBJECTIVE: PE: General: resting in hospital bed, NAD Cardiac: regular rate Pulmonary: no increased work of breathing RUE: aquacel with blood drainage, aquacel removed, incisions CDI, no active drainage, nylon sutures in place, new aquacel in placed, ace wrap on elbow CDI, he states he endorses axillary nerve sensation, was unable to test deltoid motor function due to patient cooperation, he endorses distal sensation, swelling of right hand as to be expected, warm well perfused hand  Vitals:   09/11/24 0430 09/11/24 0756  BP: (!) 98/49 (!) 95/48  Pulse: 61 60  Resp: 15 17  Temp: 99.1 F (37.3 C) (!) 97.5 F (36.4 C)  SpO2: 91% 90%    Opiates Today (MME): Today's  total administered Morphine Milligram Equivalents: 0 Opiates Yesterday (MME): Yesterday's total administered Morphine Milligram Equivalents: 15 Opiates Used in the last two days:  Inpatient Morphine Milligram Equivalents Per Day 11/6 - 11/7   Values displayed are in units of MME/Day    Order Start / End Date Yesterday Today    oxyCODONE (Oxy IR/ROXICODONE) immediate release tablet 5 mg 11/6 - 11/6 0 of Unknown --    oxyCODONE (ROXICODONE) 5 MG/5ML solution 5 mg 11/6 - 11/6 0 of Unknown --      Group total: 0 of Unknown     fentaNYL  (SUBLIMAZE ) injection 25-50 mcg 11/6 - 11/6 0 of 45-90 --    fentaNYL  (SUBLIMAZE ) 100 MCG/2ML injection 11/6 - 11/6 0 of Unknown --    fentaNYL  (SUBLIMAZE ) injection 50 mcg 11/6 - 11/6 15 of 15 --    HYDROcodone-acetaminophen  (NORCO/VICODIN) 5-325 MG per tablet 1-2 tablet 11/6 - No end date 0 of 15-30 0 of 30-60    HYDROcodone-acetaminophen  (NORCO) 7.5-325 MG per tablet 1-2 tablet 11/6 - No end date 0 of 22.5-45 0 of 45-90    morphine (PF) 2 MG/ML injection 0.5-1 mg  11/6 - No end date 0 of 7.5-15 0 of 18-36    Daily Totals  15 of Unknown (at least 105-195) 0 of 93-186    Calculation Errors     Order Type Date Details   oxyCODONE (Oxy IR/ROXICODONE) immediate release tablet 5 mg Ordered Dose -- Insufficient frequency information   oxyCODONE (ROXICODONE) 5 MG/5ML solution 5 mg Ordered Dose -- Insufficient frequency information   fentaNYL  (SUBLIMAZE ) 100 MCG/2ML injection Ordered Dose -- Frequency type could not be determined           Stable post-op images  ASSESSMENT: Lyal Mende is a 89 y.o. male doing well postoperatively.  PLAN: Weightbearing:  - NWB in sling until nerve block wears off - okay to use right arm for ADLs and weight bearing with walker - Sling for comfort once nerve block wears off Insicional and dressing care: Reinforce dressings as needed Orthopedic device(s): Sling Showering: post-op day # 2 with assistance VTE prophylaxis: Resume eliquis  Pain control: PRN, minimize narcotics as able Follow - up plan: TBD. PT/OT evals today. He does have good support at home. We will see how he mobilizes today. Contact information:  Weekdays 8-5 Dr. Bonner Hair, Aleck Stalling PA-C, After hours and holidays please check [email or web address] for group call information for [facility]  Aleck Stalling, PA-C 09/11/24

## 2024-09-11 NOTE — Anesthesia Postprocedure Evaluation (Signed)
 Anesthesia Post Note  Patient: Mark Lane  Procedure(s) Performed: ARTHROPLASTY, SHOULDER, TOTAL, REVERSE (Right: Shoulder)     Patient location during evaluation: PACU Anesthesia Type: General Level of consciousness: awake and alert Pain management: pain level controlled Vital Signs Assessment: post-procedure vital signs reviewed and stable Respiratory status: spontaneous breathing, nonlabored ventilation, respiratory function stable and patient connected to nasal cannula oxygen Cardiovascular status: blood pressure returned to baseline and stable Postop Assessment: no apparent nausea or vomiting Anesthetic complications: no   No notable events documented.  Last Vitals:  Vitals:   09/11/24 0430 09/11/24 0756  BP: (!) 98/49 (!) 95/48  Pulse: 61 60  Resp: 15 17  Temp: 37.3 C (!) 36.4 C  SpO2: 91% 90%    Last Pain:  Vitals:   09/11/24 1304  TempSrc:   PainSc: 0-No pain                 [name]

## 2024-09-11 NOTE — Evaluation (Signed)
 Occupational Therapy Evaluation Patient Details Name: Mark Lane MRN: [id number] DOB: Nov 08, 1921 Today's Date: 09/11/2024   History of Present Illness   Pt is a 89 y/o male who presents 09/10/2024 for surgery of prior right humeral head fracture, right proximal humerus fracture. Pt is now s/p R reverse total shoulder arthroplasty on 11/6. PMH significant for CHF, CAD, pacemaker.     Clinical Impressions PTA pt lives at an ALF with his wife. Prior to his recent fall he was mod I for mobility and ADLs with intermittent use of SPC. Education was given regarding compensatory strategies for ADL tasks, management of sling, eblow, wrist and hand ROM  per specified parameters in the order set, positioning of operated arm in sitting and supine and edema control, including the application of an icepack. However pt was limited in recall due to lethargy and confusion. No family present, OT spoke with pt's son, Harman, over the phone to discuss therapeutic plan. Pt required total A to don/doff shoulder immobilizer with education provided to maintain sling all of the time with the exception of bathing, dressing and when completing exercises until the surgeon states otherwise. Elbow, wrist and hand AAROM exercises completed with max A, see details below. He is not tolerating his arm in a dependent position, sling left donned and arm better supported with pillows. ADL education was deferred due to pt's confusion and lethargy. Overall pt also needed total A for LB ADLs and max A +2 for functional mobility with 2 person HHA. Pt will benefit from acute OT with plan to follow the surgeons follow up therapy plan.     If plan is discharge home, recommend the following:   A lot of help with walking and/or transfers;Two people to help with walking and/or transfers;A lot of help with bathing/dressing/bathroom;Two people to help with bathing/dressing/bathroom;Assistance with cooking/housework;Assistance with  feeding;Direct supervision/assist for medications management;Direct supervision/assist for financial management;Assist for transportation;Help with stairs or ramp for entrance;Supervision due to cognitive status     Functional Status Assessment   Patient has had a recent decline in their functional status and demonstrates the ability to make significant improvements in function in a reasonable and predictable amount of time.     Equipment Recommendations   None recommended by OT (defer)     Recommendations for Other Services         Precautions/Restrictions   Precautions Precautions: Fall;Shoulder Type of Shoulder Precautions: total reverse Shoulder Interventions: Shoulder sling/immobilizer;Off for dressing/bathing/exercises (Per ortho note: sling for comfort once nerve block wears off) Precaution Booklet Issued: No Recall of Precautions/Restrictions: Impaired Required Braces or Orthoses: Sling Restrictions Weight Bearing Restrictions Per Provider Order: Yes RUE Weight Bearing Per Provider Order: Non weight bearing (per ortho note: okay to use RUE for RW use, otherwise NWB.) Other Position/Activity Restrictions: No ROM of shoulder. Unrestricted ROM of elbow, wrist and hand. Okay to use RUE for ADLs at waist level.     Mobility Bed Mobility Overal bed mobility: Needs Assistance Bed Mobility: Supine to Sit     Supine to sit: Total assist, +2 for physical assistance, +2 for safety/equipment          Transfers Overall transfer level: Needs assistance Equipment used: 2 person hand held assist Transfers: Sit to/from Stand, Bed to chair/wheelchair/BSC Sit to Stand: Max assist, +2 safety/equipment, +2 physical assistance     Step pivot transfers: Total assist, +2 physical assistance, +2 safety/equipment            Balance Overall balance assessment: Modified  Independent, Needs assistance Sitting-balance support: Feet supported Sitting balance-Leahy Scale:  Poor Sitting balance - Comments: close CGA-mod A   Standing balance support: During functional activity Standing balance-Leahy Scale: Poor Standing balance comment: reliant on +2 A for standing balance                           ADL either performed or assessed with clinical judgement   ADL Overall ADL's : Needs assistance/impaired Eating/Feeding: Minimal assistance Eating/Feeding Details (indicate cue type and reason): coughing noted at drinking coffee Grooming: Wash/dry face;Minimal assistance Grooming Details (indicate cue type and reason): min A for elbow support Upper Body Bathing: Maximal assistance   Lower Body Bathing: Total assistance;+2 for safety/equipment;+2 for physical assistance   Upper Body Dressing : Maximal assistance   Lower Body Dressing: Total assistance;+2 for physical assistance;+2 for safety/equipment   Toilet Transfer: Maximal assistance;+2 for physical assistance;+2 for safety/equipment   Toileting- Clothing Manipulation and Hygiene: Total assistance;+2 for physical assistance;+2 for safety/equipment       Functional mobility during ADLs: Maximal assistance;+2 for physical assistance;+2 for safety/equipment General ADL Comments: limited by arousal, confusion, RUE pain, unsteady balance     Vision Baseline Vision/History: 0 No visual deficits Additional Comments: unsure of baseline     Perception Perception: Not tested       Praxis Praxis: Not tested       Pertinent Vitals/Pain Pain Assessment Pain Assessment: Faces Faces Pain Scale: Hurts even more Pain Location: RUE Pain Descriptors / Indicators: Discomfort, Grimacing, Guarding     Extremity/Trunk Assessment Upper Extremity Assessment Upper Extremity Assessment: RUE deficits/detail RUE Deficits / Details: reverse TSA, PROM of hand and wrist are WFL. digits 2-3 do not have full extension (pt stating this is baseline). pt has about 50% of active range at hand and wrist.  Sensation normal throughout. Very minimal tolerance to PROM of elbow. Began shoulder education but pt lethargic and confused RUE Sensation: WNL RUE Coordination: decreased fine motor;decreased gross motor   Lower Extremity Assessment Lower Extremity Assessment: Defer to PT evaluation   Cervical / Trunk Assessment Cervical / Trunk Assessment: Kyphotic   Communication Communication Communication: Impaired Factors Affecting Communication: Reduced clarity of speech   Cognition Arousal: Lethargic Behavior During Therapy: Flat affect Cognition: Cognition impaired   Orientation impairments: Situation, Time, Place, Person Awareness: Intellectual awareness impaired Memory impairment (select all impairments): Short-term memory, Working civil service fast streamer, Non-declarative long-term memory, Geneticist, Molecular long-term memory Attention impairment (select first level of impairment): Focused attention Executive functioning impairment (select all impairments): Organization, Sequencing, Reasoning OT - Cognition Comments: pt mostly limited by lethargy. pt unable to accurately answer any orientation questions, confusion noted throughout. Pt reported that he lives with Donia but stated that is his granddaughter (she is his wife). followed some simple commands with maximal stimulation for arousal                 Following commands: Impaired Following commands impaired: Follows one step commands inconsistently     Cueing  General Comments   Cueing Techniques: Verbal cues  soft BP but stanble   Exercises Exercises: Shoulder Shoulder Exercises Elbow Flexion: PROM, 5 reps, Seated Elbow Extension: PROM, 5 reps, Seated Wrist Flexion: AAROM, 5 reps, Seated Wrist Extension: AAROM, 5 reps, Seated Digit Composite Flexion: AAROM, 10 reps, Seated Composite Extension: AAROM, 10 reps, Seated   Shoulder Instructions Shoulder Instructions Donning/doffing sling/immobilizer:  (total A)    Home Living Family/patient  expects to be discharged to:: Assisted  living                             Home Equipment: Cane - single point;Grab bars - toilet;Grab bars - tub/shower   Additional Comments: ALF with wife (amputee, unable to physically assist), handicap accessible apt, walk in showers, grab bars throughout      Prior Functioning/Environment Prior Level of Function : Independent/Modified Independent;Needs assist             Mobility Comments: prior to recent fall: intermimttent use of SPC, walks to the dinning hall, mod I for most functional ambulation ADLs Comments: prior to recent fall: mod I for most ADLs, if he needed help staff at ALF would assist as needed    OT Problem List: Decreased strength;Decreased range of motion;Decreased activity tolerance;Impaired balance (sitting and/or standing);Decreased cognition;Decreased safety awareness;Decreased knowledge of use of DME or AE;Decreased knowledge of precautions;Pain   OT Treatment/Interventions: Self-care/ADL training;Therapeutic exercise;DME and/or AE instruction;Therapeutic activities;Balance training;Patient/family education      OT Goals(Current goals can be found in the care plan section)   Acute Rehab OT Goals Patient Stated Goal: unable to state OT Goal Formulation: With patient/family Time For Goal Achievement: 09/25/24 Potential to Achieve Goals: Good ADL Goals Pt Will Perform Upper Body Dressing: with min assist;sitting Pt Will Perform Lower Body Dressing: with mod assist;sit to/from stand Pt Will Transfer to Toilet: with min assist;ambulating Additional ADL Goal #1: Pt will complete RUE reverse TSA exercises with min A   OT Frequency:  Min 3X/week    Co-evaluation PT/OT/SLP Co-Evaluation/Treatment: Yes Reason for Co-Treatment: Complexity of the patient's impairments (multi-system involvement);Necessary to address cognition/behavior during functional activity;For patient/therapist safety;To address functional/ADL  transfers PT goals addressed during session: Mobility/safety with mobility;Balance;Proper use of DME;Strengthening/ROM OT goals addressed during session: ADL's and self-care      AM-PAC OT 6 Clicks Daily Activity     Outcome Measure Help from another person eating meals?: A Little Help from another person taking care of personal grooming?: A Lot Help from another person toileting, which includes using toliet, bedpan, or urinal?: Total Help from another person bathing (including washing, rinsing, drying)?: A Lot Help from another person to put on and taking off regular upper body clothing?: A Lot Help from another person to put on and taking off regular lower body clothing?: Total 6 Click Score: 11   End of Session Equipment Utilized During Treatment: Gait belt Nurse Communication: Mobility status  Activity Tolerance: Patient limited by pain;Patient limited by lethargy Patient left: in chair;with call bell/phone within reach  OT Visit Diagnosis: Unsteadiness on feet (R26.81);Other abnormalities of gait and mobility (R26.89);Muscle weakness (generalized) (M62.81);History of falling (Z91.81);Repeated falls (R29.6);Pain                Time: 0917-0950 OT Time Calculation (min): 33 min Charges:  OT General Charges $OT Visit: 1 Visit OT Evaluation $OT Eval Moderate Complexity: 1 Mod  Lucie Kendall, OTR/L Acute Rehabilitation Services Office [phone] Secure Chat Communication Preferred   Lucie JONETTA Kendall 09/11/2024, 11:51 AM

## 2024-09-11 NOTE — Evaluation (Signed)
 Physical Therapy Evaluation  Patient Details Name: Mark Lane MRN: [id number] DOB: [birth year] Today's Date: 09/11/2024  History of Present Illness  Pt is a 89 y/o male who presents 09/10/2024 for surgery of prior right humeral head fracture, right proximal humerus fracture. Pt is now s/p R reverse total shoulder arthroplasty on 11/6. PMH significant for CHF, CAD, pacemaker.  Clinical Impression  Pt admitted with above diagnosis. Pt currently with functional limitations due to the deficits listed below (see PT Problem List). PTA pt independent and cognitively intact at ALF where he lives with his wife. At the time of PT eval pt was able to perform transfers with up to +2 total assist and RUE in sling. Pt with decreased cognition but overall agreeable and participatory throughout PT evaluation. Recommend continued rehab <3 hours/day at d/c. Pt will benefit from acute skilled PT to increase their independence and safety with mobility to allow discharge.           If plan is discharge home, recommend the following: Two people to help with walking and/or transfers;Two people to help with bathing/dressing/bathroom;Assistance with cooking/housework;Assist for transportation;Help with stairs or ramp for entrance;Supervision due to cognitive status   Can travel by private vehicle   No    Equipment Recommendations Other (comment) (TBD by next venue of care)  Recommendations for Other Services       Functional Status Assessment Patient has had a recent decline in their functional status and demonstrates the ability to make significant improvements in function in a reasonable and predictable amount of time.     Precautions / Restrictions Precautions Precautions: Fall;Shoulder Type of Shoulder Precautions: total reverse Shoulder Interventions: Shoulder sling/immobilizer;Off for dressing/bathing/exercises (Per ortho note: sling for comfort once nerve block wears off) Precaution Booklet Issued:  No Recall of Precautions/Restrictions: Impaired Required Braces or Orthoses: Sling Restrictions Weight Bearing Restrictions Per Provider Order: Yes RUE Weight Bearing Per Provider Order: Non weight bearing (per ortho note: okay to use RUE for RW use, otherwise NWB.) Other Position/Activity Restrictions: No ROM of shoulder. Unrestricted ROM of elbow, wrist and hand. Okay to use RUE for ADLs at waist level.      Mobility  Bed Mobility Overal bed mobility: Needs Assistance Bed Mobility: Supine to Sit     Supine to sit: +2 for physical assistance, +2 for safety/equipment, Max assist, HOB elevated     General bed mobility comments: Bed pad and +2 assist for transition to EOB. Pt able to advance LE's somewhat but required assist.    Transfers Overall transfer level: Needs assistance Equipment used: 2 person hand held assist Transfers: Sit to/from Stand, Bed to chair/wheelchair/BSC Sit to Stand: Max assist, +2 safety/equipment, +2 physical assistance   Step pivot transfers: Total assist, +2 physical assistance, +2 safety/equipment       General transfer comment: Heavy +2 assist and multimodal cues required for all aspects of transition from bed>chair. Pt with incontinence of wet bowel during transfer. Pt able to tolerate a second stand after rest in the chair to allow for hygiene.    Ambulation/Gait               General Gait Details: Unable to tolerate progression to gait training.  Stairs            Wheelchair Mobility     Tilt Bed    Modified Rankin (Stroke Patients Only)       Balance Overall balance assessment: Modified Independent, Needs assistance Sitting-balance support: Feet supported Sitting balance-Leahy Scale: Poor  Sitting balance - Comments: close CGA-mod A   Standing balance support: During functional activity Standing balance-Leahy Scale: Zero Standing balance comment: reliant on +2 A for standing balance                              Pertinent Vitals/Pain Pain Assessment Pain Assessment: Faces Faces Pain Scale: Hurts even more Pain Location: RUE Pain Descriptors / Indicators: Discomfort, Grimacing, Guarding Pain Intervention(s): Limited activity within patient's tolerance, Monitored during session, Repositioned    Home Living Family/patient expects to be discharged to:: Assisted living                 Home Equipment: Cane - single point;Grab bars - toilet;Grab bars - tub/shower Additional Comments: ALF with wife (amputee, unable to physically assist), handicap accessible apt, walk in showers, grab bars throughout    Prior Function Prior Level of Function : Independent/Modified Independent;Needs assist             Mobility Comments: prior to recent fall: intermimttent use of SPC, walks to the dinning hall, mod I for most functional ambulation ADLs Comments: prior to recent fall: mod I for most ADLs, if he needed help staff at ALF would assist as needed     Extremity/Trunk Assessment   Upper Extremity Assessment Upper Extremity Assessment: RUE deficits/detail RUE Deficits / Details: reverse TSA, PROM of hand and wrist are [facility]. digits 2-3 do not have full extension (pt stating this is baseline). pt has about 50% of active range at hand and wrist. Sensation normal throughout. Very minimal tolerance to PROM of elbow. Began shoulder education but pt lethargic and confused RUE Sensation: WNL RUE Coordination: decreased fine motor;decreased gross motor    Lower Extremity Assessment Lower Extremity Assessment: RLE deficits/detail;LLE deficits/detail RLE Coordination: decreased fine motor;decreased gross motor LLE Coordination: decreased gross motor;decreased fine motor    Cervical / Trunk Assessment Cervical / Trunk Assessment: Kyphotic  Communication   Communication Communication: Impaired Factors Affecting Communication: Reduced clarity of speech    Cognition Arousal: Lethargic Behavior  During Therapy: Flat affect   PT - Cognitive impairments: Orientation, Awareness, Memory, Attention, Sequencing, Problem solving, Safety/Judgement   Orientation impairments: Place, Time                     Following commands: Impaired Following commands impaired: Follows one step commands inconsistently     Cueing Cueing Techniques: Verbal cues     General Comments General comments (skin integrity, edema, etc.): soft BP but stable    Exercises     Assessment/Plan    PT Assessment Patient needs continued PT services  PT Problem List Decreased strength;Decreased range of motion;Decreased activity tolerance;Decreased balance;Decreased mobility;Decreased knowledge of use of DME;Decreased safety awareness;Decreased knowledge of precautions;Pain       PT Treatment Interventions Gait training;DME instruction;Functional mobility training;Therapeutic activities;Therapeutic exercise;Balance training;Patient/family education    PT Goals (Current goals can be found in the Care Plan section)  Acute Rehab PT Goals Patient Stated Goal: Not stated PT Goal Formulation: Patient unable to participate in goal setting Time For Goal Achievement: 09/25/24 Potential to Achieve Goals: Fair    Frequency Min 2X/week     Co-evaluation PT/OT/SLP Co-Evaluation/Treatment: Yes Reason for Co-Treatment: Complexity of the patient's impairments (multi-system involvement);Necessary to address cognition/behavior during functional activity;For patient/therapist safety;To address functional/ADL transfers PT goals addressed during session: Mobility/safety with mobility;Balance;Proper use of DME;Strengthening/ROM OT goals addressed during session: ADL's and self-care  AM-PAC PT 6 Clicks Mobility  Outcome Measure Help needed turning from your back to your side while in a flat bed without using bedrails?: Total Help needed moving from lying on your back to sitting on the side of a flat bed  without using bedrails?: Total Help needed moving to and from a bed to a chair (including a wheelchair)?: Total Help needed standing up from a chair using your arms (e.g., wheelchair or bedside chair)?: Total Help needed to walk in hospital room?: Total Help needed climbing 3-5 steps with a railing? : Total 6 Click Score: 6    End of Session Equipment Utilized During Treatment: Gait belt;Other (comment) (sling) Activity Tolerance: Patient limited by fatigue Patient left: in chair;with call bell/phone within reach;with chair alarm set Nurse Communication: Mobility status PT Visit Diagnosis: Unsteadiness on feet (R26.81);Pain Pain - Right/Left: Right Pain - part of body: Shoulder    Time: 0914-1000 PT Time Calculation (min) (ACUTE ONLY): 46 min   Charges:   PT Evaluation $PT Eval Moderate Complexity: 1 Mod PT Treatments $Therapeutic Activity: 8-22 mins PT General Charges $$ ACUTE PT VISIT: 1 Visit         Leita Sable, PT, DPT Acute Rehabilitation Services Secure Chat Preferred Office: [phone]   Leita JONETTA Sable 09/11/2024, 1:24 PM

## 2024-09-11 NOTE — [place] FL2 (Signed)
 [place]  MEDICAID FL2 LEVEL OF CARE FORM     IDENTIFICATION  Patient Name: Mark Lane Birthdate: 1922-06-11 Sex: male Admission Date (Current Location): 09/10/2024  [facility] and [place] Number:  Producer, Television/film/video and Address:  [facility] [place], [address] [place], [place] [address]      Provider Number: [id number]  Attending Physician Name and Address:  Cristy Bonner DASEN, MD  Relative Name and Phone Number:  lakshya, mcgillicuddy   [phone]    Current Level of Care: Hospital Recommended Level of Care: Skilled Nursing Facility Prior Approval Number:    Date Approved/Denied:   PASRR Number: [id number]  Discharge Plan: SNF    Current Diagnoses: Patient Active Problem List   Diagnosis Date Noted   Status post reverse total arthroplasty of right shoulder 09/10/2024   Gastroesophageal reflux disease without esophagitis 12/25/2021   Constipation 12/25/2021   Hypotension 07/02/2020   Failure to thrive in adult 07/02/2020   CKD (chronic kidney disease) stage 3, GFR 30-59 ml/min (HCC) 07/02/2020   Right forearm cellulitis    Biventricular cardiac pacemaker in situ 07/22/2019   Persistent atrial fibrillation (HCC) 07/22/2019   Complete heart block (HCC) 12/22/2018   Cardiomyopathy, ischemic 12/30/2012   ACUT MI OTH LAT WALL SUBSQT EPIS CARE 05/29/2010   1st degree AV block 02/23/2009   Hyperlipidemia 02/22/2009   Coronary atherosclerosis 02/22/2009    Orientation RESPIRATION BLADDER Height & Weight     Self, Situation, Place  Normal   Weight: 144 lb 6.4 oz (65.5 kg) Height:  5' 4 (162.6 cm)  BEHAVIORAL SYMPTOMS/MOOD NEUROLOGICAL BOWEL NUTRITION STATUS        Diet (see discharge summary)  AMBULATORY STATUS COMMUNICATION OF NEEDS Skin   Total Care Verbally Surgical wounds                       Personal Care Assistance Level of Assistance  Bathing, Feeding, Dressing, Total care Bathing Assistance: Maximum assistance Feeding  assistance: Limited assistance Dressing Assistance: Maximum assistance Total Care Assistance: Maximum assistance   Functional Limitations Info  Sight, Hearing, Speech Sight Info: Adequate Hearing Info: Adequate Speech Info: Adequate    SPECIAL CARE FACTORS FREQUENCY  PT (By licensed PT), OT (By licensed OT)     PT Frequency: 5x week OT Frequency: 5x weej            Contractures Contractures Info: Not present    Additional Factors Info  Code Status, Allergies Code Status Info: full Allergies Info: NKA           Current Medications (09/11/2024):  This is the current hospital active medication list Current Facility-Administered Medications  Medication Dose Route Frequency Provider Last Rate Last Admin   acetaminophen  (TYLENOL ) tablet 500 mg  500 mg Oral Q6H McBane, Caroline N, PA-C   500 mg at 09/11/24 1304   apixaban  (ELIQUIS ) tablet 2.5 mg  2.5 mg Oral BID McBane, Caroline N, PA-C   2.5 mg at 09/11/24 1052   bisacodyl (DULCOLAX) suppository 10 mg  10 mg Rectal Daily PRN McBane, Caroline N, PA-C       diphenhydrAMINE (BENADRYL) 12.5 MG/5ML elixir 12.5-25 mg  12.5-25 mg Oral Q4H PRN McBane, Caroline N, PA-C       docusate sodium (COLACE) capsule 100 mg  100 mg Oral BID McBane, Caroline N, PA-C   100 mg at 09/11/24 1048   HYDROcodone-acetaminophen  (NORCO) 7.5-325 MG per tablet 1-2 tablet  1-2 tablet Oral Q4H PRN McBane,  Caroline N, PA-C       HYDROcodone-acetaminophen  (NORCO/VICODIN) 5-325 MG per tablet 1-2 tablet  1-2 tablet Oral Q4H PRN McBane, Caroline N, PA-C       loratadine (CLARITIN) tablet 10 mg  10 mg Oral Daily McBane, Caroline N, PA-C   10 mg at 09/11/24 1048   menthol (CEPACOL) lozenge 3 mg  1 lozenge Oral PRN McBane, Caroline N, PA-C       Or   phenol (CHLORASEPTIC) mouth spray 1 spray  1 spray Mouth/Throat PRN McBane, Caroline N, PA-C       metoprolol  succinate (TOPROL -XL) 24 hr tablet 12.5 mg  12.5 mg Oral Daily McBane, Caroline N, PA-C       morphine (PF) 2  MG/ML injection 0.5-1 mg  0.5-1 mg Intravenous Q2H PRN McBane, Caroline N, PA-C       ondansetron  (ZOFRAN ) tablet 4 mg  4 mg Oral Q6H PRN McBane, Caroline N, PA-C       Or   ondansetron  (ZOFRAN ) injection 4 mg  4 mg Intravenous Q6H PRN McBane, Caroline N, PA-C       pantoprazole  (PROTONIX ) EC tablet 40 mg  40 mg Oral Daily McBane, Caroline N, PA-C   40 mg at 09/11/24 1047   polyethylene glycol (MIRALAX / GLYCOLAX) packet 17 g  17 g Oral Daily PRN McBane, Caroline N, PA-C       simvastatin  (ZOCOR ) tablet 40 mg  40 mg Oral q1800 McBane, Caroline N, PA-C   40 mg at 09/10/24 1722   spironolactone  (ALDACTONE ) tablet 12.5 mg  12.5 mg Oral Daily McBane, Caroline N, PA-C   12.5 mg at 09/11/24 1048   zolpidem (AMBIEN) tablet 5 mg  5 mg Oral QHS PRN McBane, Caroline N, PA-C         Discharge Medications: Please see discharge summary for a list of discharge medications.  Relevant Imaging Results:  Relevant Lab Results:   Additional Information SSN: [id number]  Bridget Cordella Simmonds, LCSW

## 2024-09-12 MED ORDER — LACTATED RINGERS IV SOLN: INTRAVENOUS | Status: AC

## 2024-09-12 MED ORDER — ENSURE PLUS HIGH PROTEIN PO LIQD
237.0000 mL | Freq: Two times a day (BID) | ORAL | Status: DC
  Administered 2024-09-12 – 2024-09-14 (×5): 237 mL via ORAL

## 2024-09-12 NOTE — TOC Progression Note (Signed)
 Transition of Care [facility]) - Progression Note    Patient Details  Name: Mark Lane MRN: [id number] Date of Birth: [birth year]  [facility] of [facility]) CM/SW Contact  Dino CHRISTELLA Au, LCSWA Phone Number: 09/12/2024, 10:21 AM  Clinical Narrative:     SW spoke with Geni (HTA [id number]) pt approved for 7 days. SNF #[id number] PTAR #[id number]. Per chart review, bed ready on Monday at [facility].  Expected Discharge Plan: Skilled Nursing Facility Barriers to Discharge: SNF Pending bed offer, Continued Medical Work up     Expected Discharge Plan and Services In-house Referral: Clinical Social Work   Post Acute Care Choice: Skilled Nursing Facility Living arrangements for the past 2 months: Assisted Living Facility ([facility])                                       Social Drivers of Health (SDOH) Interventions SDOH Screenings   Food Insecurity: Patient Unable To Answer (09/11/2024)  Housing: Low Risk  (09/11/2024)  Transportation Needs: No Transportation Needs (09/11/2024)  Utilities: Not At Risk (09/11/2024)  Financial Resource Strain: Low Risk  (11/02/2022)   [facility] from [facility]  Social Connections: Patient Unable To Answer (09/11/2024)  Tobacco Use: Medium Risk (09/10/2024)    Readmission Risk Interventions     No data to display

## 2024-09-12 NOTE — Plan of Care (Signed)
  Problem: Skin Integrity: Goal: Risk for impaired skin integrity will decrease Outcome: Not Progressing   Problem: Education: Goal: Knowledge of the prescribed therapeutic regimen will improve Outcome: Not Progressing Goal: Understanding of activity limitations/precautions following surgery will improve Outcome: Not Progressing

## 2024-09-12 NOTE — Progress Notes (Signed)
 Occupational Therapy Treatment Patient Details Name: Mark Lane MRN: [id number] DOB: 1922/01/13 Today's Date: 09/12/2024   History of present illness Pt is a 89 y/o male who presents 09/10/2024 for surgery of prior right humeral head fracture, right proximal humerus fracture. Pt is now s/p R reverse total shoulder arthroplasty on 11/6. PMH significant for CHF, CAD, pacemaker.   OT comments  Patient demonstrating gains with bed mobility and transfers. Patient stating no pain but grimacing when donning gown. Patient was mod assist +2 to transfer to recliner and began wheezing.  SpO2 checked at 67% and patient placed on 2 liters O2 with SpO2 increasing to 100%.  Patient was 95% at rest on RA at end of session.  Patient will benefit from continued inpatient follow up therapy, <3 hours/day.  Acute OT to continue to follow to address established goals to facilitate DC to next venue of care.        If plan is discharge home, recommend the following:  A lot of help with walking and/or transfers;Two people to help with walking and/or transfers;A lot of help with bathing/dressing/bathroom;Two people to help with bathing/dressing/bathroom;Assistance with cooking/housework;Assistance with feeding;Direct supervision/assist for medications management;Direct supervision/assist for financial management;Assist for transportation;Help with stairs or ramp for entrance;Supervision due to cognitive status   Equipment Recommendations  Other (comment) (defer)    Recommendations for Other Services      Precautions / Restrictions Precautions Precautions: Fall;Shoulder Type of Shoulder Precautions: total reverse Shoulder Interventions: Shoulder sling/immobilizer;Off for dressing/bathing/exercises (Per ortho note: sling for comfort once nerve block wears off) Recall of Precautions/Restrictions: Impaired Precaution/Restrictions Comments: watch SpO2 with mobility Required Braces or Orthoses:  Sling Restrictions Weight Bearing Restrictions Per Provider Order: Yes RUE Weight Bearing Per Provider Order: Non weight bearing Other Position/Activity Restrictions: No ROM of shoulder. Unrestricted ROM of elbow, wrist and hand. Okay to use RUE for ADLs at waist level.       Mobility Bed Mobility Overal bed mobility: Needs Assistance Bed Mobility: Supine to Sit     Supine to sit: Max assist, +2 for safety/equipment     General bed mobility comments: increased time and use of bedpad to scoot hips to EOB    Transfers Overall transfer level: Needs assistance Equipment used: Rolling walker (2 wheels) Transfers: Sit to/from Stand, Bed to chair/wheelchair/BSC Sit to Stand: Mod assist, +2 physical assistance     Step pivot transfers: Mod assist, +2 physical assistance     General transfer comment: mod assist +2 for sit to stands from EOB and for step pivot transfer to recliner with assistance to manage walker     Balance Overall balance assessment: Needs assistance Sitting-balance support: Feet supported, Feet unsupported Sitting balance-Leahy Scale: Poor Sitting balance - Comments: R lateral lean and pt tending to hold feet out requiring cues   Standing balance support: During functional activity, Bilateral upper extremity supported, Reliant on assistive device for balance Standing balance-Leahy Scale: Poor Standing balance comment: reliant on RW and external support due to posterior bias                           ADL either performed or assessed with clinical judgement   ADL Overall ADL's : Needs assistance/impaired                 Upper Body Dressing : Maximal assistance Upper Body Dressing Details (indicate cue type and reason): donn gown Lower Body Dressing: Maximal assistance Lower Body Dressing Details (indicate cue  type and reason): to donn socks               General ADL Comments: patient undressed except sling upon entry and sling  removed to perform dressing and patient stating comfort without sling    Extremity/Trunk Assessment              Vision       Perception     Praxis     Communication Communication Communication: Impaired Factors Affecting Communication: Reduced clarity of speech   Cognition Arousal: Alert Behavior During Therapy: Flat affect Cognition: Cognition impaired   Orientation impairments: Place, Time Awareness: Intellectual awareness impaired Memory impairment (select all impairments): Short-term memory, Working civil service fast streamer, Copywriter, advertising, Engineer, structural memory Attention impairment (select first level of impairment): Selective attention Executive functioning impairment (select all impairments): Organization, Sequencing, Reasoning OT - Cognition Comments: more alert and oriented                 Following commands: Impaired Following commands impaired: Follows one step commands inconsistently, Follows one step commands with increased time      Cueing   Cueing Techniques: Verbal cues  Exercises      Shoulder Instructions       General Comments after transfer patient wheezing and SpO2 was 67%.  Patient placed on 2 liters O2 and increased to 100%.  Patient left in room on RA at 95% at rest    Pertinent Vitals/ Pain       Pain Assessment Pain Assessment: Faces Faces Pain Scale: Hurts a little bit Pain Location: RUE Pain Descriptors / Indicators: Discomfort, Grimacing, Guarding Pain Intervention(s): Limited activity within patient's tolerance, Monitored during session, Repositioned  Home Living                                          Prior Functioning/Environment              Frequency  Min 3X/week        Progress Toward Goals  OT Goals(current goals can now be found in the care plan section)  Progress towards OT goals: Progressing toward goals  Acute Rehab OT Goals Patient Stated Goal: none stated OT Goal  Formulation: With patient Time For Goal Achievement: 09/25/24 Potential to Achieve Goals: Good ADL Goals Pt Will Perform Upper Body Dressing: with min assist;sitting Pt Will Perform Lower Body Dressing: with mod assist;sit to/from stand Pt Will Transfer to Toilet: with min assist;ambulating Additional ADL Goal #1: Pt will complete RUE reverse TSA exercises with min A  Plan      Co-evaluation    PT/OT/SLP Co-Evaluation/Treatment: Yes Reason for Co-Treatment: Complexity of the patient's impairments (multi-system involvement);Necessary to address cognition/behavior during functional activity;For patient/therapist safety;To address functional/ADL transfers PT goals addressed during session: Mobility/safety with mobility;Balance;Proper use of DME;Strengthening/ROM OT goals addressed during session: ADL's and self-care      AM-PAC OT 6 Clicks Daily Activity     Outcome Measure   Help from another person eating meals?: A Little Help from another person taking care of personal grooming?: A Lot Help from another person toileting, which includes using toliet, bedpan, or urinal?: Total Help from another person bathing (including washing, rinsing, drying)?: A Lot Help from another person to put on and taking off regular upper body clothing?: A Lot Help from another person to put on and taking off regular lower body clothing?:  Total 6 Click Score: 11    End of Session Equipment Utilized During Treatment: Gait belt;Rolling walker (2 wheels)  OT Visit Diagnosis: Unsteadiness on feet (R26.81);Other abnormalities of gait and mobility (R26.89);Muscle weakness (generalized) (M62.81);History of falling (Z91.81);Repeated falls (R29.6);Pain Pain - Right/Left: Right Pain - part of body: Shoulder   Activity Tolerance Patient tolerated treatment well   Patient Left in chair;with call bell/phone within reach;with chair alarm set   Nurse Communication Mobility status        Time: 9175-9150 OT  Time Calculation (min): 25 min  Charges: OT General Charges $OT Visit: 1 Visit OT Treatments $Self Care/Home Management : 8-22 mins  Dick Laine, OTA Acute Rehabilitation Services  Office [phone]   Jeb LITTIE Laine 09/12/2024, 12:46 PM

## 2024-09-12 NOTE — Progress Notes (Signed)
 Orthopaedic Trauma Service Progress Note  Patient ID: Mark Lane MRN: [id number] DOB/AGE: 88/11/1921 88 y.o.  Subjective:  Sitting up in bedside chair No specific complaints but he is confused.  Thinks he is in a waiting room No reported issues overnight  BP meds held this morning  Has not had any narcotics since surgery  ROS As above Today's  total administered Morphine Milligram Equivalents: 0 Yesterday's total administered Morphine Milligram Equivalents: 0  Objective:   VITALS:   Vitals:   09/11/24 1628 09/11/24 1921 09/12/24 0507 09/12/24 0816  BP: 104/71 (!) 114/51 (!) 109/48 (!) 93/46  Pulse:  64 (!) 59 (!) 59  Resp:  18  18  Temp:  98.3 F (36.8 C) 98.4 F (36.9 C) 97.6 F (36.4 C)  TempSrc:  Rectal    SpO2:  99% 92% 95%  Weight:      Height:        Estimated body mass index is 24.79 kg/m as calculated from the following:   Height as of this encounter: 5' 4 (1.626 m).   Weight as of this encounter: 65.5 kg.   Intake/Output      11/07 0701 11/08 0700 11/08 0701 11/09 0700   P.O.  120   I.V. (mL/kg)     IV Piggyback     Total Intake(mL/kg)  120 (1.8)   Urine (mL/kg/hr) 600 (0.4)    Stool 0    Blood     Total Output 600    Net -600 +120        Urine Occurrence 1 x    Stool Occurrence 1 x      LABS  No results found for this or any previous visit (from the past 24 hours).   PHYSICAL EXAM:   Gen: Pleasant, sitting up in bed but confused      Orientation: Oriented to person but thinks he is in a waiting room Lungs: Unlabored Cardiac: Regular Ext:       Right upper extremity  Aquacel bandages in place.  Some scant strikethrough is noted but overall the dressing is clean and stable  Ace wrap is in place to right upper extremity  Diffuse ecchymosis to his chest wall as well as his distal right arm  Axillary nerve sensation appears to be grossly intact but not  really cooperating with deltoid testing  Radial, ulnar, median nerve motor and sensory functions are intact distally  Palpable radial pulse  Extremity is warm  Mild swelling to the hand  Assessment/Plan: 2 Days Post-Op   Principal Problem:   Status post reverse total arthroplasty of right shoulder   Anti-infectives (From admission, onward)    Start     Dose/Rate Route Frequency Ordered Stop   09/10/24 1800  ceFAZolin  (ANCEF ) IVPB 2g/100 mL premix        2 g 200 mL/hr over 30 Minutes Intravenous Every 6 hours 09/10/24 1546 09/11/24 0054   09/10/24 1337  vancomycin (VANCOCIN) powder  Status:  Discontinued          As needed 09/10/24 1337 09/10/24 1418   09/10/24 1015  ceFAZolin  (ANCEF ) IVPB 2g/100 mL premix        2 g 200 mL/hr over 30 Minutes Intravenous On call to O.R. 09/10/24 1009 09/10/24 1247     .  POD/HD#: 2  89 year old male s/p right reverse total shoulder arthroplasty for failed nonoperative treatment of right proximal humerus fracture  -Reverse right total shoulder arthroplasty postop day 2 PLAN: Weightbearing:  - NWB in sling until nerve block wears off---> block appears to have worn off at this time - okay to use right arm for ADLs and weight bearing with walker - Sling for comfort once nerve block wears off Insicional and dressing care: Reinforce dressings as needed Orthopedic device(s): Sling Showering: post-op day # 2 with assistance VTE prophylaxis: Resume eliquis  Medical:  BP meds held this morning due to soft pressures  Continue to monitor  CBC in the morning  Will restart some maintenance IV fluids at around 50 cc/hr Pain control: PRN, minimize narcotics as able Follow - up plan: SNF plan for Monday  Contact information:  Weekdays 8-5 Dr. Bonner Hair, Aleck Stalling PA-C, After hours and holidays please check [email or web address] for group call information for [facility]    Francis MICAEL Mt, PA-C [phone] (C) 09/12/2024, 12:59 PM  [facility] [address] [place] [address] [phone] MAXIMINO MILLING (F)    After 5pm and on the weekends please log on to Amion, go to orthopaedics and the look under the [facility] Call for the provider(s) on call. You can also call our office at [phone] and then follow the prompts to be connected to the call team.  Patient ID: Mark Lane, male   DOB: [birth year], 89 y.o.   MRN: [id number]

## 2024-09-12 NOTE — Progress Notes (Addendum)
 Physical Therapy Treatment Patient Details Name: Mark Lane MRN: [id number] DOB: 02/23/1922 Today's Date: 09/12/2024   History of Present Illness Pt is a 89 y/o male who presents 09/10/2024 for surgery of prior right humeral head fracture, right proximal humerus fracture. Pt is now s/p R reverse total shoulder arthroplasty on 11/6. PMH significant for CHF, CAD, pacemaker.    PT Comments  Pt received in supine and agreeable to session. Pt able to demonstrate improved bed mobility and transfers this session requiring less physical assist. Pt able to step pivot to recliner using RW without reports of increased pain. Pt able to perform a few static standing marches with mod A for balance due to increased posterior lean. Pt noted to desat to 67% after transfer improving to 100% with seated rest and 2L O2. Pt left on RA with SpO2 95% and RN present. Pt continues to benefit from PT services to progress toward functional mobility goals.     If plan is discharge home, recommend the following: Two people to help with walking and/or transfers;Two people to help with bathing/dressing/bathroom;Assistance with cooking/housework;Assist for transportation;Help with stairs or ramp for entrance;Supervision due to cognitive status   Can travel by private vehicle     No  Equipment Recommendations  Other (comment) (TBD by next venue of care)    Recommendations for Other Services       Precautions / Restrictions Precautions Precautions: Fall;Shoulder Type of Shoulder Precautions: total reverse Shoulder Interventions: Shoulder sling/immobilizer;Off for dressing/bathing/exercises (Per ortho note: sling for comfort once nerve block wears off) Recall of Precautions/Restrictions: Impaired Precaution/Restrictions Comments: watch SpO2 with mobility Required Braces or Orthoses: Sling Restrictions Weight Bearing Restrictions Per Provider Order: Yes RUE Weight Bearing Per Provider Order: Non weight bearing Other  Position/Activity Restrictions: No ROM of shoulder. Unrestricted ROM of elbow, wrist and hand. Okay to use RUE for ADLs at waist level.     Mobility  Bed Mobility Overal bed mobility: Needs Assistance Bed Mobility: Supine to Sit     Supine to sit: Max assist, +2 for safety/equipment     General bed mobility comments: increased time and use of bedpad to scoot hips to EOB    Transfers Overall transfer level: Needs assistance Equipment used: Rolling walker (2 wheels) Transfers: Sit to/from Stand, Bed to chair/wheelchair/BSC Sit to Stand: Mod assist, +2 physical assistance   Step pivot transfers: Mod assist, +2 physical assistance       General transfer comment: STS from EOB with mod A +2 for power up and anterior weight shift. Cues for hand placement. Step pivot to recliner with mod A +2 for balance and RW management. Pt demonstrates small steps and posterior lean    Ambulation/Gait               General Gait Details: unable to progress due to fatigue   Stairs             Wheelchair Mobility     Tilt Bed    Modified Rankin (Stroke Patients Only)       Balance Overall balance assessment: Needs assistance Sitting-balance support: Feet supported, Feet unsupported Sitting balance-Leahy Scale: Poor Sitting balance - Comments: R lateral lean and pt tending to hold feet out requiring cues   Standing balance support: During functional activity, Bilateral upper extremity supported, Reliant on assistive device for balance Standing balance-Leahy Scale: Poor Standing balance comment: reliant on RW and external support due to posterior bias  Communication Communication Communication: Impaired Factors Affecting Communication: Reduced clarity of speech  Cognition Arousal: Alert Behavior During Therapy: Flat affect   PT - Cognitive impairments: Orientation, Awareness, Memory, Attention, Sequencing, Problem solving,  Safety/Judgement                         Following commands: Impaired Following commands impaired: Follows one step commands inconsistently, Follows one step commands with increased time    Cueing Cueing Techniques: Verbal cues  Exercises      General Comments General comments (skin integrity, edema, etc.): SpO2 noted to drop to 67% after transfer and pt wheezing. SpO2 improved to 83% with seated rest and to 100% with 2L O2. SpO2 95% on RA at end of session with RN present      Pertinent Vitals/Pain Pain Assessment Pain Assessment: Faces Faces Pain Scale: Hurts a little bit Pain Location: RUE Pain Descriptors / Indicators: Discomfort, Grimacing, Guarding Pain Intervention(s): Limited activity within patient's tolerance, Monitored during session, Repositioned     PT Goals (current goals can now be found in the care plan section) Acute Rehab PT Goals Patient Stated Goal: Not stated PT Goal Formulation: Patient unable to participate in goal setting Time For Goal Achievement: 09/25/24 Progress towards PT goals: Progressing toward goals    Frequency    Min 2X/week           Co-evaluation PT/OT/SLP Co-Evaluation/Treatment: Yes Reason for Co-Treatment: Complexity of the patient's impairments (multi-system involvement);Necessary to address cognition/behavior during functional activity;For patient/therapist safety;To address functional/ADL transfers PT goals addressed during session: Mobility/safety with mobility;Balance;Proper use of DME;Strengthening/ROM        AM-PAC PT 6 Clicks Mobility   Outcome Measure  Help needed turning from your back to your side while in a flat bed without using bedrails?: A Lot Help needed moving from lying on your back to sitting on the side of a flat bed without using bedrails?: A Lot Help needed moving to and from a bed to a chair (including a wheelchair)?: Total Help needed standing up from a chair using your arms (e.g.,  wheelchair or bedside chair)?: Total Help needed to walk in hospital room?: Total Help needed climbing 3-5 steps with a railing? : Total 6 Click Score: 8    End of Session Equipment Utilized During Treatment: Gait belt Activity Tolerance: Patient limited by fatigue;Other (comment) (desat) Patient left: in chair;with call bell/phone within reach;with chair alarm set;with nursing/sitter in room Nurse Communication: Mobility status PT Visit Diagnosis: Unsteadiness on feet (R26.81);Pain Pain - Right/Left: Right Pain - part of body: Shoulder     Time: 9174-9148 PT Time Calculation (min) (ACUTE ONLY): 26 min  Charges:    $Therapeutic Activity: 8-22 mins PT General Charges $$ ACUTE PT VISIT: 1 Visit                    Darryle George, PTA Acute Rehabilitation Services Secure Chat Preferred  [phone]    Darryle George 09/12/2024, 9:08 AM

## 2024-09-13 LAB — CBC
HCT: 31.1 % — ABNORMAL LOW (ref 39.0–52.0)
Hemoglobin: 10.2 g/dL — ABNORMAL LOW (ref 13.0–17.0)
MCH: 30.4 pg (ref 26.0–34.0)
MCHC: 32.8 g/dL (ref 30.0–36.0)
MCV: 92.6 fL (ref 80.0–100.0)
Platelets: 200 K/uL (ref 150–400)
RBC: 3.36 MIL/uL — ABNORMAL LOW (ref 4.22–5.81)
RDW: 13.8 % (ref 11.5–15.5)
WBC: 12.1 K/uL — ABNORMAL HIGH (ref 4.0–10.5)
nRBC: 0 % (ref 0.0–0.2)

## 2024-09-13 MED ORDER — ACETAMINOPHEN 325 MG PO TABS
650.0000 mg | ORAL_TABLET | Freq: Three times a day (TID) | ORAL | Status: DC
  Administered 2024-09-13 – 2024-09-14 (×3): 650 mg via ORAL
  Filled 2024-09-13 (×3): qty 2

## 2024-09-13 NOTE — Progress Notes (Signed)
 Patient has been sleeping through this shift. Respirations even and unlabored, bed low position, call light in reach.  IV infusing LR at 50ml/hr without any difficulty.  Ongoing monitoring observed and implemented

## 2024-09-13 NOTE — Plan of Care (Signed)

## 2024-09-13 NOTE — Progress Notes (Signed)
 Orthopaedic Trauma Service Progress Note  Patient ID: Mark Lane MRN: [id number] DOB/AGE: 88/11/1921 88 y.o.  Subjective:  No acute issues  Pressures a bit better yesterday and overnight  Tolerated IVF well  No other complaints    ROS As above  Today's  total administered Morphine Milligram Equivalents: 0 Yesterday's total administered Morphine Milligram Equivalents: 0  Objective:   VITALS:   Vitals:   09/12/24 1517 09/12/24 1958 09/13/24 0452 09/13/24 0721  BP: (!) 112/53 (!) 131/47 (!) 125/50 (!) 105/50  Pulse: 61 60 60 63  Resp: 19 16 16 18   Temp: 97.7 F (36.5 C) 98 F (36.7 C) 97.8 F (36.6 C) 98.1 F (36.7 C)  TempSrc:      SpO2: 96% 94% 100% 99%  Weight:      Height:        Estimated body mass index is 24.79 kg/m as calculated from the following:   Height as of this encounter: 5' 4 (1.626 m).   Weight as of this encounter: 65.5 kg.   Intake/Output      11/08 0701 11/09 0700 11/09 0701 11/10 0700   P.O. 120 120   I.V. (mL/kg) 617 (9.4)    Total Intake(mL/kg) 737 (11.3) 120 (1.8)   Urine (mL/kg/hr)     Stool     Total Output     Net +737 +120        Urine Occurrence  1 x     LABS  Results for orders placed or performed during the hospital encounter of 09/10/24 (from the past 24 hours)  CBC     Status: Abnormal   Collection Time: 09/13/24  5:03 AM  Result Value Ref Range   WBC 12.1 (H) 4.0 - 10.5 K/uL   RBC 3.36 (L) 4.22 - 5.81 MIL/uL   Hemoglobin 10.2 (L) 13.0 - 17.0 g/dL   HCT 68.8 (L) 60.9 - 47.9 %   MCV 92.6 80.0 - 100.0 fL   MCH 30.4 26.0 - 34.0 pg   MCHC 32.8 30.0 - 36.0 g/dL   RDW 86.1 88.4 - 84.4 %   Platelets 200 150 - 400 K/uL   nRBC 0.0 0.0 - 0.2 %     PHYSICAL EXAM:   Gen: Pleasant, sitting up in bed, less confused today  Lungs: Unlabored Cardiac: Regular Ext:       Right upper extremity             Aquacel bandages in place.  Some scant  strikethrough is noted but overall the dressing is clean and stable             Ace wrap is in place to right upper extremity             Diffuse ecchymosis to his chest wall as well as his distal right arm             Axillary nerve sensation appears to be grossly intact but does not want to move his shoulder due to pain              Radial, ulnar, median nerve motor and sensory functions are intact distally             Palpable radial pulse             Extremity is warm  Mild swelling to the hand    Assessment/Plan: 3 Days Post-Op   Principal Problem:   Status post reverse total arthroplasty of right shoulder   Anti-infectives (From admission, onward)    Start     Dose/Rate Route Frequency Ordered Stop   09/10/24 1800  ceFAZolin  (ANCEF ) IVPB 2g/100 mL premix        2 g 200 mL/hr over 30 Minutes Intravenous Every 6 hours 09/10/24 1546 09/11/24 0054   09/10/24 1337  vancomycin (VANCOCIN) powder  Status:  Discontinued          As needed 09/10/24 1337 09/10/24 1418   09/10/24 1015  ceFAZolin  (ANCEF ) IVPB 2g/100 mL premix        2 g 200 mL/hr over 30 Minutes Intravenous On call to O.R. 09/10/24 1009 09/10/24 1247     .  POD/HD#: 34  89 year old male s/p right reverse total shoulder arthroplasty for failed nonoperative treatment of right proximal humerus fracture   -Reverse right total shoulder arthroplasty postop day 3  PLAN: Weightbearing:  - NWB in sling until nerve block wears off---> block appears to have worn off at this time - okay to use right arm for ADLs and weight bearing with walker - Sling for comfort once nerve block wears off  Insicional and dressing care: Reinforce dressings as needed Orthopedic device(s): Sling Showering: post-op day # 2 with assistance VTE prophylaxis: Resume eliquis  Medical:             BPs are better             Continue to monitor             CBC is stable             Continue IVF until order expires this afternoon    Pain control: PRN, minimize narcotics as able   Has not had any pain meds in 36 hours (narcotic and non-narcotic)   Will put tylenol  on schedule   Follow - up plan: SNF plan for Monday   Contact information:  Weekdays 8-5 Dr. Bonner Hair, Aleck Stalling PA-C, After hours and holidays please check [email or web address] for group call information for [facility]   Francis MICAEL Mt, PA-C [phone] (C) 09/13/2024, 10:15 AM  [facility] [address] [place] [address] [phone] MAXIMINO MILLING (F)    After 5pm and on the weekends please log on to Amion, go to orthopaedics and the look under the [facility] Call for the provider(s) on call. You can also call our office at [phone] and then follow the prompts to be connected to the call team.  Patient ID: Mark Lane, male   DOB: Nov 14, 1921, 89 y.o.   MRN: [id number]

## 2024-09-13 NOTE — TOC Progression Note (Signed)
 Transition of Care [facility]) - Progression Note    Patient Details  Name: Clete Kuch MRN: [id number] Date of Birth: 1922-10-10  [facility]) CM/SW Contact  Cena Ligas, [place] Phone Number: 09/13/2024, 2:13 PM  Clinical Narrative:     Pts insurance shara has been approved. SNF approval shara is [id number] and Ptar shara is [id number].   Expected Discharge Plan: Skilled Nursing Facility Barriers to Discharge: SNF Pending bed offer, Continued Medical Work up               Expected Discharge Plan and Services In-house Referral: Clinical Social Work   Post Acute Care Choice: Skilled Nursing Facility Living arrangements for the past 2 months: Assisted Living Facility ([facility])                                       Social Drivers of Health (SDOH) Interventions SDOH Screenings   Food Insecurity: Patient Unable To Answer (09/11/2024)  Housing: Low Risk  (09/11/2024)  Transportation Needs: No Transportation Needs (09/11/2024)  Utilities: Not At Risk (09/11/2024)  Financial Resource Strain: Low Risk  (11/02/2022)   Received from [facility]  Social Connections: Patient Unable To Answer (09/11/2024)  Tobacco Use: Medium Risk (09/10/2024)    Readmission Risk Interventions     No data to display

## 2024-09-14 DIAGNOSIS — S42201D Unspecified fracture of upper end of right humerus, subsequent encounter for fracture with routine healing: Secondary | ICD-10-CM | POA: Diagnosis not present

## 2024-09-14 DIAGNOSIS — E7849 Other hyperlipidemia: Secondary | ICD-10-CM | POA: Diagnosis not present

## 2024-09-14 DIAGNOSIS — I5022 Chronic systolic (congestive) heart failure: Secondary | ICD-10-CM | POA: Diagnosis not present

## 2024-09-14 DIAGNOSIS — I2510 Atherosclerotic heart disease of native coronary artery without angina pectoris: Secondary | ICD-10-CM | POA: Diagnosis not present

## 2024-09-14 DIAGNOSIS — E785 Hyperlipidemia, unspecified: Secondary | ICD-10-CM | POA: Diagnosis not present

## 2024-09-14 DIAGNOSIS — I4819 Other persistent atrial fibrillation: Secondary | ICD-10-CM | POA: Diagnosis not present

## 2024-09-14 DIAGNOSIS — M6259 Muscle wasting and atrophy, not elsewhere classified, multiple sites: Secondary | ICD-10-CM | POA: Diagnosis not present

## 2024-09-14 DIAGNOSIS — R262 Difficulty in walking, not elsewhere classified: Secondary | ICD-10-CM | POA: Diagnosis not present

## 2024-09-14 DIAGNOSIS — R1310 Dysphagia, unspecified: Secondary | ICD-10-CM | POA: Diagnosis not present

## 2024-09-14 DIAGNOSIS — Z96611 Presence of right artificial shoulder joint: Secondary | ICD-10-CM | POA: Diagnosis not present

## 2024-09-14 DIAGNOSIS — K5909 Other constipation: Secondary | ICD-10-CM | POA: Diagnosis not present

## 2024-09-14 DIAGNOSIS — S42221A 2-part displaced fracture of surgical neck of right humerus, initial encounter for closed fracture: Secondary | ICD-10-CM | POA: Diagnosis not present

## 2024-09-14 DIAGNOSIS — R41841 Cognitive communication deficit: Secondary | ICD-10-CM | POA: Diagnosis not present

## 2024-09-14 DIAGNOSIS — R2681 Unsteadiness on feet: Secondary | ICD-10-CM | POA: Diagnosis not present

## 2024-09-14 DIAGNOSIS — R296 Repeated falls: Secondary | ICD-10-CM | POA: Diagnosis not present

## 2024-09-14 DIAGNOSIS — L089 Local infection of the skin and subcutaneous tissue, unspecified: Secondary | ICD-10-CM | POA: Diagnosis not present

## 2024-09-14 DIAGNOSIS — I509 Heart failure, unspecified: Secondary | ICD-10-CM | POA: Diagnosis not present

## 2024-09-14 DIAGNOSIS — K219 Gastro-esophageal reflux disease without esophagitis: Secondary | ICD-10-CM | POA: Diagnosis not present

## 2024-09-14 DIAGNOSIS — R531 Weakness: Secondary | ICD-10-CM | POA: Diagnosis not present

## 2024-09-14 DIAGNOSIS — Z743 Need for continuous supervision: Secondary | ICD-10-CM | POA: Diagnosis not present

## 2024-09-14 NOTE — TOC Progression Note (Addendum)
 Transition of Care [facility]) - Progression Note    Patient Details  Name: Shrihan Putt MRN: [id number] Date of Birth: 04/29/1922  [facility]) CM/SW Contact  Bridget Cordella Simmonds, LCSW Phone Number: 09/14/2024, 10:03 AM  Clinical Narrative:   CSW confirmed with Melanie/[facility]: they can receive pt today, will need DC summary by noon.   CSW spoke with Dr Cristy office, they will  have PA call CSW back when available.   Epic chat from [employer]: will work on DC.     Expected Discharge Plan: Skilled Nursing Facility Barriers to Discharge: SNF Pending bed offer, Continued Medical Work up               Expected Discharge Plan and Services In-house Referral: Clinical Social Work   Post Acute Care Choice: Skilled Nursing Facility Living arrangements for the past 2 months: Assisted Living Facility ([facility])                                       Social Drivers of Health (SDOH) Interventions SDOH Screenings   Food Insecurity: Patient Unable To Answer (09/11/2024)  Housing: Low Risk  (09/11/2024)  Transportation Needs: No Transportation Needs (09/11/2024)  Utilities: Not At Risk (09/11/2024)  Financial Resource Strain: Low Risk (11/02/2022)   Received from [facility]  Social Connections: Patient Unable To Answer (09/11/2024)  Tobacco Use: Medium Risk (09/10/2024)    Readmission Risk Interventions     No data to display

## 2024-09-14 NOTE — TOC Transition Note (Signed)
 Transition of Care [facility]) - Discharge Note   Patient Details  Name: Mark Lane MRN: [id number] Date of Birth: 12-05-1921  Transition of Care [facility] CM/SW Contact:  Bridget Cordella Simmonds, LCSW Phone Number: 09/14/2024, 11:24 AM   Clinical Narrative:   Pt discharging to [facility], room west 147-2.  RN report to: [phone], [phone].  PTAR called [phone].     Final next level of care: Skilled Nursing Facility Barriers to Discharge: Barriers Resolved   Patient Goals and CMS Choice Patient states their goals for this hospitalization and ongoing recovery are:: get back to [facility]   Choice offered to / list presented to : Patient, Adult Children (son Darrel requesting [facility], pt agreeable to this)      Discharge Placement              Patient chooses bed at:  [facility]) Patient to be transferred to facility by: PTAR Name of family member notified: son [name] Patient and family notified of of transfer: 09/14/24  Discharge Plan and Services Additional resources added to the After Visit Summary for   In-house Referral: Clinical Social Work   Post Acute Care Choice: Skilled Nursing Facility                               Social Drivers of Health (SDOH) Interventions SDOH Screenings   Food Insecurity: Patient Unable To Answer (09/11/2024)  Housing: Low Risk  (09/11/2024)  Transportation Needs: No Transportation Needs (09/11/2024)  Utilities: Not At Risk (09/11/2024)  Financial Resource Strain: Low Risk (11/02/2022)   Received [facility]  Social Connections: Patient Unable To Answer (09/11/2024)  Tobacco Use: Medium Risk (09/10/2024)     Readmission Risk Interventions     No data to display

## 2024-09-14 NOTE — Plan of Care (Signed)
  Problem: Education: Goal: Knowledge of General Education information will improve Description: Including pain rating scale, medication(s)/side effects and non-pharmacologic comfort measures Outcome: Adequate for Discharge   Problem: Health Behavior/Discharge Planning: Goal: Ability to manage health-related needs will improve Outcome: Adequate for Discharge   Problem: Clinical Measurements: Goal: Ability to maintain clinical measurements within normal limits will improve Outcome: Adequate for Discharge Goal: Will remain free from infection Outcome: Adequate for Discharge Goal: Diagnostic test results will improve Outcome: Adequate for Discharge Goal: Respiratory complications will improve Outcome: Adequate for Discharge Goal: Cardiovascular complication will be avoided Outcome: Adequate for Discharge   Problem: Activity: Goal: Risk for activity intolerance will decrease Outcome: Adequate for Discharge   Problem: Nutrition: Goal: Adequate nutrition will be maintained Outcome: Adequate for Discharge   Problem: Coping: Goal: Level of anxiety will decrease Outcome: Adequate for Discharge   Problem: Elimination: Goal: Will not experience complications related to bowel motility Outcome: Adequate for Discharge Goal: Will not experience complications related to urinary retention Outcome: Adequate for Discharge   Problem: Pain Managment: Goal: General experience of comfort will improve and/or be controlled Outcome: Adequate for Discharge   Problem: Safety: Goal: Ability to remain free from injury will improve Outcome: Adequate for Discharge   Problem: Skin Integrity: Goal: Risk for impaired skin integrity will decrease Outcome: Adequate for Discharge   Problem: Education: Goal: Knowledge of the prescribed therapeutic regimen will improve Outcome: Adequate for Discharge Goal: Understanding of activity limitations/precautions following surgery will improve Outcome:  Adequate for Discharge Goal: Individualized Educational Video(s) Outcome: Adequate for Discharge   Problem: Activity: Goal: Ability to tolerate increased activity will improve Outcome: Adequate for Discharge   Problem: Pain Management: Goal: Pain level will decrease with appropriate interventions Outcome: Adequate for Discharge

## 2024-09-14 NOTE — Discharge Instructions (Signed)
 Bonner Hair MD, MPH Mark Stalling, PA-C [facility] [address], [address] [phone] (tel)   [phone] (fax)   POST-OPERATIVE INSTRUCTIONS - TOTAL SHOULDER REPLACEMENT    WOUND CARE You may leave the operative dressing in place until your follow-up appointment. KEEP THE INCISIONS CLEAN AND DRY. There may be a small amount of fluid/bleeding leaking at the surgical site. This is normal after surgery.  If it fills with liquid or blood please call us  immediately to change it for you. Use the provided ice machine or Ice packs as often as possible for the first 3-4 days, then as needed for pain relief.   Keep a layer of cloth or a shirt between your skin and the cooling unit to prevent frost bite as it can get very cold.  SHOWERING: - You may shower on Post-Op Day #2.  - The dressing is water  resistant but do not scrub it as it may start to peel up.   - You may remove the sling for showering - Gently pat the area dry.  - Do not soak the shoulder in water .  - Do not go swimming in the pool or ocean until your incision has completely healed (about 4-6 weeks after surgery) - KEEP THE INCISIONS CLEAN AND DRY.  EXERCISES  Accidental/Purposeful External Rotation and shoulder flexion (reaching behind you) is to be avoided at all costs for the first month. It is normal for your fingers/hand to become more swollen after surgery and discolored from bruising.   This will resolve over the first few weeks usually after surgery. Please continue to ambulate and do not stay sitting or lying for too long.  Perform foot and wrist pumps to assist in circulation.  PHYSICAL THERAPY - okay to use right arm for ADLs at waist level - okay to use right arm for weight bearing with walker - Sling for comfort  - okay for active/passive elbow/wrist/hand exercises - no shoulder ROM for 4 weeks   FOLLOW-UP If you develop a Fever (>101.5), Redness or Drainage from the surgical  incision site, please call our office to arrange for an evaluation. Please call the office to schedule a follow-up appointment for a wound check, 7-10 days post-operatively.  IF YOU HAVE ANY QUESTIONS, PLEASE FEEL FREE TO CALL OUR OFFICE.  HELPFUL INFORMATION  Your arm will be in a sling following surgery.   You may be more comfortable sleeping in a semi-seated position the first few nights following surgery.  Keep a pillow propped under the elbow and forearm for comfort.  If you have a recliner type of chair it might be beneficial.  If not that is fine too, but it would be helpful to sleep propped up with pillows behind your operated shoulder as well under your elbow and forearm.  This will reduce pulling on the suture lines.  When dressing, put your operative arm in the sleeve first.  When getting undressed, take your operative arm out last.  Loose fitting, button-down shirts are recommended.  In most states it is against the law to drive while your arm is in a sling. And certainly against the law to drive while taking narcotics.  You may return to work/school in the next couple of days when you feel up to it. Desk work and typing in the sling is fine.  We suggest you use the pain medication the first night prior to going to bed, in order to ease any pain when the anesthesia wears off.  You should avoid taking pain medications on an empty stomach as it will make you nauseous.  You should wean off your narcotic medicines as soon as you are able.     Most patients will be off narcotics before their first postop appointment.   Do not drink alcoholic beverages or take illicit drugs when taking pain medications.  Pain medication may make you constipated.  Below are a few solutions to try in this order: Decrease the amount of pain medication if you aren't having pain. Drink lots of decaffeinated fluids. Drink prune juice and/or each dried prunes  If the first 3 don't work start with  additional solutions Take Colace - an over-the-counter stool softener Take Senokot - an over-the-counter laxative Take Miralax - a stronger over-the-counter laxative   Dental Antibiotics:  We require dental prophylaxis for 2 years after a shoulder replacement  Contact your surgeon for an antibiotic prescription, prior to your dental procedure.   For more information including helpful videos and documents visit our website:   [email or web address]

## 2024-09-14 NOTE — Plan of Care (Addendum)
 AVS placed in DC packet, son aware of discharging to facility, no further questions. Report given to [place] at facility! Glasses on patient. IV removed.   Problem: Education: Goal: Knowledge of General Education information will improve Description: Including pain rating scale, medication(s)/side effects and non-pharmacologic comfort measures Outcome: Adequate for Discharge   Problem: Health Behavior/Discharge Planning: Goal: Ability to manage health-related needs will improve Outcome: Adequate for Discharge   Problem: Clinical Measurements: Goal: Ability to maintain clinical measurements within normal limits will improve Outcome: Adequate for Discharge Goal: Will remain free from infection Outcome: Adequate for Discharge Goal: Diagnostic test results will improve Outcome: Adequate for Discharge Goal: Respiratory complications will improve Outcome: Adequate for Discharge Goal: Cardiovascular complication will be avoided Outcome: Adequate for Discharge   Problem: Activity: Goal: Risk for activity intolerance will decrease Outcome: Adequate for Discharge   Problem: Nutrition: Goal: Adequate nutrition will be maintained Outcome: Adequate for Discharge   Problem: Coping: Goal: Level of anxiety will decrease Outcome: Adequate for Discharge   Problem: Elimination: Goal: Will not experience complications related to bowel motility Outcome: Adequate for Discharge Goal: Will not experience complications related to urinary retention Outcome: Adequate for Discharge   Problem: Pain Managment: Goal: General experience of comfort will improve and/or be controlled Outcome: Adequate for Discharge   Problem: Safety: Goal: Ability to remain free from injury will improve Outcome: Adequate for Discharge   Problem: Skin Integrity: Goal: Risk for impaired skin integrity will decrease Outcome: Adequate for Discharge   Problem: Education: Goal: Knowledge of the prescribed therapeutic  regimen will improve Outcome: Adequate for Discharge Goal: Understanding of activity limitations/precautions following surgery will improve Outcome: Adequate for Discharge Goal: Individualized Educational Video(s) Outcome: Adequate for Discharge   Problem: Activity: Goal: Ability to tolerate increased activity will improve Outcome: Adequate for Discharge   Problem: Pain Management: Goal: Pain level will decrease with appropriate interventions Outcome: Adequate for Discharge

## 2024-09-14 NOTE — Telephone Encounter (Signed)
 Surgery successfully performed on 09/10/2024. Will remove from preoperative clearance pool.

## 2024-09-14 NOTE — Progress Notes (Signed)
 Occupational Therapy Treatment Patient Details Name: Mark Lane MRN: [id number] DOB: 09/04/1922 Today's Date: 09/14/2024   History of present illness Pt is a 89 y/o male who presents 09/10/2024 for surgery of prior right humeral head fracture, right proximal humerus fracture. Pt is now s/p R reverse total shoulder arthroplasty on 11/6. PMH significant for CHF, CAD, pacemaker.   OT comments  Pt is making limited progress towards their acute OT goals. Pt seen with PT for safety. Overall he needed max A +2 for bed mobility and min-mod A +2 fro transfers and functional mobility with RW. Pt's sling was removed for RUE AAROM exercises, RW use and functional use during ADLs - pt tolerated well. He had an incontinent BM in standing and needed total A for LB ALDs, mod A for UB ADLs and mod A +2 for for grooming at the sink. He was pleasantly confused but following all commands throughout session. OT to continue to follow acutely to facilitate progress towards established goals. Pt will continue to benefit from skilled inpatient follow up therapy, <3 hours/day.       If plan is discharge home, recommend the following:  A lot of help with walking and/or transfers;Two people to help with walking and/or transfers;A lot of help with bathing/dressing/bathroom;Two people to help with bathing/dressing/bathroom;Assistance with cooking/housework;Assistance with feeding;Direct supervision/assist for medications management;Direct supervision/assist for financial management;Assist for transportation;Help with stairs or ramp for entrance;Supervision due to cognitive status   Equipment Recommendations  Other (comment)       Precautions / Restrictions Precautions Precautions: Fall;Shoulder Type of Shoulder Precautions: total reverse Shoulder Interventions: Shoulder sling/immobilizer;Off for dressing/bathing/exercises Precaution Booklet Issued: No Recall of Precautions/Restrictions: Impaired Required Braces or  Orthoses: Sling Restrictions Weight Bearing Restrictions Per Provider Order: Yes RUE Weight Bearing Per Provider Order: Non weight bearing Other Position/Activity Restrictions: No ROM of shoulder. Unrestricted ROM of elbow, wrist and hand. Okay to use RUE for ADLs at waist level.       Mobility Bed Mobility Overal bed mobility: Needs Assistance Bed Mobility: Supine to Sit     Supine to sit: Max assist, +2 for physical assistance, +2 for safety/equipment     General bed mobility comments: significant cues needed    Transfers Overall transfer level: Needs assistance Equipment used: Rolling walker (2 wheels) Transfers: Sit to/from Stand Sit to Stand: Min assist, +2 physical assistance, +2 safety/equipment           General transfer comment: min A +2 fot STS, mod A +2 needed for functional mobility with RW at times     Balance Overall balance assessment: Needs assistance Sitting-balance support: Feet supported, Feet unsupported Sitting balance-Leahy Scale: Fair Sitting balance - Comments: close CGA   Standing balance support: During functional activity, Bilateral upper extremity supported, Reliant on assistive device for balance Standing balance-Leahy Scale: Poor                             ADL either performed or assessed with clinical judgement   ADL Overall ADL's : Needs assistance/impaired     Grooming: Moderate assistance Grooming Details (indicate cue type and reason): +2 for safety, mod A for balance and for mgmt of RUE - washing hands at the sink     Lower Body Bathing: Total assistance;+2 for physical assistance;+2 for safety/equipment Lower Body Bathing Details (indicate cue type and reason): incontinent BM, total A for LB bathing for safety Upper Body Dressing : Moderate assistance Upper Body  Dressing Details (indicate cue type and reason): mod A for management of RUE Lower Body Dressing: Total assistance;+2 for safety/equipment;+2 for  physical assistance Lower Body Dressing Details (indicate cue type and reason): total A for socks Toilet Transfer: Moderate assistance;+2 for physical assistance;+2 for safety/equipment Toilet Transfer Details (indicate cue type and reason): min-mod A +2 for transfers and functional mobility Toileting- Clothing Manipulation and Hygiene: Total assistance;+2 for safety/equipment;+2 for physical assistance Toileting - Clothing Manipulation Details (indicate cue type and reason): incontinent BM in standing, total A for hygiene in standing     Functional mobility during ADLs: Moderate assistance;+2 for physical assistance;+2 for safety/equipment General ADL Comments: pt had an incontinent BM upon standing, session completed in the bathroom with toileting, dressing, bathing and grooming. limited by balance, activity, tolerance and RUE    Extremity/Trunk Assessment Upper Extremity Assessment Upper Extremity Assessment: RUE deficits/detail RUE Deficits / Details: reverse TSA, PROM of hand and wrist are WFL. digits 2-3 do not have full extension (pt stating this is baseline). elbow, wrist and hand AAROM completed. Pt with improved tolerance this date. Pt's sling removed for functional use of RUE during ADLs and RW use. RUE Sensation: WNL RUE Coordination: decreased fine motor;decreased gross motor   Lower Extremity Assessment Lower Extremity Assessment: Defer to PT evaluation        Vision   Vision Assessment?: No apparent visual deficits Additional Comments: wears glasses   Perception Perception Perception: Not tested   Praxis Praxis Praxis: Not tested   Communication Communication Communication: Impaired Factors Affecting Communication: Reduced clarity of speech   Cognition Arousal: Alert Behavior During Therapy: Flat affect Cognition: Cognition impaired   Orientation impairments: Place, Time Awareness: Intellectual awareness impaired Memory impairment (select all impairments):  Short-term memory, Working civil service fast streamer, Copywriter, advertising, Engineer, structural memory Attention impairment (select first level of impairment): Selective attention Executive functioning impairment (select all impairments): Organization, Sequencing, Reasoning OT - Cognition Comments: increased arousal but pt remains pleasantly confused. Unaware he is in the hospital despite several reminders                 Following commands: Impaired Following commands impaired: Follows one step commands inconsistently, Follows one step commands with increased time      Cueing   Cueing Techniques: Verbal cues  Exercises Exercises: Shoulder Shoulder Exercises Elbow Flexion: AAROM, 10 reps, Seated Elbow Extension: AAROM, 10 reps, Seated Wrist Flexion: AAROM, Seated, 5 reps Wrist Extension: AAROM, 5 reps, Seated Digit Composite Flexion: AAROM, 5 reps, Seated Composite Extension: AAROM, 5 reps, Seated    Shoulder Instructions Shoulder Instructions Donning/doffing shirt without moving shoulder: Maximal assistance Donning/doffing sling/immobilizer:  (total A) ROM for elbow, wrist and digits of operated UE: Maximal assistance Sling wearing schedule (on at all times/off for ADL's): Maximal assistance     General Comments VSS    Pertinent Vitals/ Pain       Pain Assessment Pain Assessment: Faces Faces Pain Scale: Hurts little more Pain Location: RUE with ADLs Pain Descriptors / Indicators: Discomfort, Grimacing, Guarding Pain Intervention(s): Limited activity within patient's tolerance, Monitored during session   Frequency  Min 3X/week        Progress Toward Goals  OT Goals(current goals can now be found in the care plan section)  Progress towards OT goals: Progressing toward goals  Acute Rehab OT Goals Patient Stated Goal: to see wife OT Goal Formulation: With patient Time For Goal Achievement: 09/25/24 Potential to Achieve Goals: Good ADL Goals Pt Will Perform  Upper Body Dressing:  with min assist;sitting Pt Will Perform Lower Body Dressing: with mod assist;sit to/from stand Pt Will Transfer to Toilet: with min assist;ambulating Additional ADL Goal #1: Pt will complete RUE reverse TSA exercises with min A  Plan      Co-evaluation    PT/OT/SLP Co-Evaluation/Treatment: Yes Reason for Co-Treatment: Complexity of the patient's impairments (multi-system involvement);Necessary to address cognition/behavior during functional activity;For patient/therapist safety;To address functional/ADL transfers PT goals addressed during session: Mobility/safety with mobility;Balance;Proper use of DME;Strengthening/ROM OT goals addressed during session: ADL's and self-care      AM-PAC OT 6 Clicks Daily Activity     Outcome Measure   Help from another person eating meals?: A Little Help from another person taking care of personal grooming?: A Lot Help from another person toileting, which includes using toliet, bedpan, or urinal?: Total Help from another person bathing (including washing, rinsing, drying)?: A Lot Help from another person to put on and taking off regular upper body clothing?: A Lot Help from another person to put on and taking off regular lower body clothing?: Total 6 Click Score: 11    End of Session Equipment Utilized During Treatment: Gait belt;Rolling walker (2 wheels)  OT Visit Diagnosis: Unsteadiness on feet (R26.81);Other abnormalities of gait and mobility (R26.89);Muscle weakness (generalized) (M62.81);History of falling (Z91.81);Repeated falls (R29.6);Pain Pain - Right/Left: Right Pain - part of body: Shoulder   Activity Tolerance Patient tolerated treatment well   Patient Left in chair;with call bell/phone within reach;with chair alarm set   Nurse Communication Mobility status        Time: 9091-9056 OT Time Calculation (min): 35 min  Charges: OT General Charges $OT Visit: 1 Visit OT Treatments $Self Care/Home Management  : 8-22 mins  Lucie Kendall, OTR/L Acute Rehabilitation Services Office [phone] Secure Chat Communication Preferred   Lucie JONETTA Kendall 09/14/2024, 10:16 AM

## 2024-09-14 NOTE — Discharge Summary (Signed)
 Patient ID: Ziquan Fidel MRN: [id number] DOB/AGE: [birth year] 89 y.o.  Admit date: 09/10/2024 Discharge date: 09/14/2024  Admission Diagnoses:Right humeral head fracture, right proximal humerus fracture   Discharge Diagnoses:  Principal Problem:   Status post reverse total arthroplasty of right shoulder   Past Medical History:  Diagnosis Date   CHF (congestive heart failure) (HCC)    Coronary artery disease    History of kidney stones    years ago   Hyperlipidemia      Procedures Performed: Right reverse total shoulder arthroplasty, ORIF tuberosities  Discharged Condition: stable  Hospital Course: Patient brought in for scheduled procedure. He tolerated procedure well.  He was kept for monitoring overnight for pain control, medical monitoring postop, and discharge planning. PT/OT evaluated the patient and recommended SNF. Patient was found to be stable for DC to SNF on 09/14/24.  Patient was instructed on specific activity restrictions and all questions were answered.  Opiates Today (MME): Today's  total administered Morphine Milligram Equivalents: 0 Opiates Yesterday (MME): Yesterday's total administered Morphine Milligram Equivalents: 0 Opiates Used in the last two days:  Inpatient Morphine Milligram Equivalents Per Day 11/6 - 11/10   Values displayed are in units of MME/Day    Order Start / End Date 11/6 11/7 11/8 Yesterday Today    oxyCODONE (Oxy IR/ROXICODONE) immediate release tablet 5 mg 11/6 - 11/6 0 of Unknown -- -- -- --    oxyCODONE (ROXICODONE) 5 MG/5ML solution 5 mg 11/6 - 11/6 0 of Unknown -- -- -- --      Group total: 0 of Unknown        fentaNYL  (SUBLIMAZE ) injection 25-50 mcg 11/6 - 11/6 0 of 45-90 -- -- -- --    fentaNYL  (SUBLIMAZE ) 100 MCG/2ML injection 11/6 - 11/6 0 of Unknown -- -- -- --    fentaNYL  (SUBLIMAZE ) injection 50 mcg 11/6 - 11/6 15 of 15 -- -- -- --    HYDROcodone-acetaminophen  (NORCO/VICODIN) 5-325 MG per tablet 1-2 tablet 11/6 - No end  date 0 of 15-30 0 of 30-60 0 of 30-60 0 of 30-60 0 of 30-60    HYDROcodone-acetaminophen  (NORCO) 7.5-325 MG per tablet 1-2 tablet 11/6 - No end date 0 of 22.5-45 0 of 45-90 0 of 45-90 0 of 45-90 0 of 45-90    morphine (PF) 2 MG/ML injection 0.5-1 mg 11/6 - No end date 0 of 7.5-15 0 of 18-36 0 of 18-36 0 of 18-36 0 of 18-36    Daily Totals  15 of Unknown (at least 105-195) 0 of 93-186 0 of 93-186 0 of 93-186 0 of 93-186    Calculation Errors     Order Type Date Details   oxyCODONE (Oxy IR/ROXICODONE) immediate release tablet 5 mg Ordered Dose -- Insufficient frequency information   oxyCODONE (ROXICODONE) 5 MG/5ML solution 5 mg Ordered Dose -- Insufficient frequency information   fentaNYL  (SUBLIMAZE ) 100 MCG/2ML injection Ordered Dose -- Frequency type could not be determined            Consults: PT/OT  Significant Diagnostic Studies: No additional pertinent studies  Treatments: Surgery  Discharge Exam: Gen: Pleasant, sitting up in bed, less confused today  Lungs: Unlabored Cardiac: Regular Ext:       Right upper extremity             Aquacel bandages in place.  Some scant strikethrough is noted but overall the dressing is clean and stable  Ace wrap is in place to right upper extremity             Diffuse ecchymosis to his chest wall as well as his distal right arm             Axillary nerve sensation appears to be grossly intact but does not want to move his shoulder due to pain              Radial, ulnar, median nerve motor and sensory functions are intact distally             Palpable radial pulse             Extremity is warm             Mild swelling to the hand  Disposition: Discharge disposition: 03-Skilled Nursing Facility     SNF  Discharge Instructions     Call MD for:  redness, tenderness, or signs of infection (pain, swelling, redness, odor or green/yellow discharge around incision site)   Complete by: As directed    Call MD for:  severe  uncontrolled pain   Complete by: As directed    Call MD for:  temperature >100.4   Complete by: As directed    Diet - low sodium heart healthy   Complete by: As directed       Allergies as of 09/14/2024   No Known Allergies      Medication List     TAKE these medications    acetaminophen  325 MG tablet Commonly known as: TYLENOL  Take 650 mg by mouth every 6 (six) hours as needed.   acetaminophen  500 MG tablet Commonly known as: TYLENOL  Take 500 mg by mouth in the morning, at noon, and at bedtime.   apixaban  2.5 MG Tabs tablet Commonly known as: Eliquis  Take 1 tablet (2.5 mg total) by mouth 2 (two) times daily.   cetirizine 10 MG tablet Commonly known as: ZYRTEC Take 10 mg by mouth daily.   cholecalciferol  25 MCG (1000 UNIT) tablet Commonly known as: VITAMIN D3 Take 1,000 Units by mouth daily.   cyanocobalamin  500 MCG tablet Commonly known as: VITAMIN B12 Take 1 tablet (500 mcg total) by mouth daily.   furosemide  20 MG tablet Commonly known as: LASIX  TAKE 1 TO 2 TABLETS DAILY AS DIRECTED (ALTERNATING DAYS) What changed: See the new instructions.   HYDROcodone-acetaminophen  5-325 MG tablet Commonly known as: NORCO/VICODIN Take 1 tablet by mouth every 8 (eight) hours as needed (pain).   metoprolol  succinate 25 MG 24 hr tablet Commonly known as: Toprol  XL Take 0.5 tablets (12.5 mg total) by mouth daily.   MiraLax 17 GM/SCOOP powder Generic drug: polyethylene glycol powder Take 17 g by mouth daily as needed for moderate constipation.   pantoprazole  40 MG tablet Commonly known as: PROTONIX  TAKE 1 TABLET DAILY   simvastatin  40 MG tablet Commonly known as: ZOCOR  TAKE ONE TABLET DAILY AT 6PM What changed: See the new instructions.   spironolactone  25 MG tablet Commonly known as: ALDACTONE  TAKE (1/2) TABLET DAILY.         Aleck Stalling, PA-C 09/14/24

## 2024-09-14 NOTE — Telephone Encounter (Signed)
 Notes in chart from 09/10/24 admission: Hospital Problem List   Status post reverse total arthroplasty of right shoulder   Looks to be the surgery has been completed. I will update the preop APP as well.

## 2024-09-14 NOTE — Progress Notes (Signed)
 Physical Therapy Treatment  Patient Details Name: Mark Lane MRN: [id number] DOB: Apr 18, 1922 Today's Date: 09/14/2024   History of Present Illness Pt is a 89 y/o male who presents 09/10/2024 for surgery of prior right humeral head fracture, right proximal humerus fracture. Pt is now s/p R reverse total shoulder arthroplasty on 11/6. PMH significant for CHF, CAD, pacemaker.    PT Comments  Pt progressing towards physical therapy goals. More alert this session and with overall good rehab effort.  Pt continues to require +2 assist for OOB mobility. Acute PT recommendations remain appropriate and unchanged at this time. Will continue to follow.    If plan is discharge home, recommend the following: Two people to help with walking and/or transfers;Two people to help with bathing/dressing/bathroom;Assistance with cooking/housework;Assist for transportation;Help with stairs or ramp for entrance;Supervision due to cognitive status   Can travel by private vehicle     No  Equipment Recommendations  Other (comment) (TBD by next venue of care)    Recommendations for Other Services       Precautions / Restrictions Precautions Precautions: Fall;Shoulder Type of Shoulder Precautions: total reverse Shoulder Interventions: Shoulder sling/immobilizer;Off for dressing/bathing/exercises (Per ortho note: sling for comfort once nerve block wears off) Precaution Booklet Issued: No Recall of Precautions/Restrictions: Impaired Precaution/Restrictions Comments: watch SpO2 with mobility Restrictions Weight Bearing Restrictions Per Provider Order: Yes RUE Weight Bearing Per Provider Order: Non weight bearing (WBAT allowed for walker use) Other Position/Activity Restrictions: No ROM of shoulder. Unrestricted ROM of elbow, wrist and hand. Okay to use RUE for ADLs at waist level.     Mobility  Bed Mobility Overal bed mobility: Needs Assistance Bed Mobility: Supine to Sit     Supine to sit: Max  assist, +2 for safety/equipment     General bed mobility comments: Increased time and use of bedpad to scoot hips to EOB. Sling doffed but pt maintained NWB for transition to EOB.    Transfers Overall transfer level: Needs assistance Equipment used: Rolling walker (2 wheels) Transfers: Sit to/from Stand, Bed to chair/wheelchair/BSC Sit to Stand: Min assist, +2 physical assistance, +2 safety/equipment           General transfer comment: Pt with good initiation of movement and power up to full stand. Assist for management of RUE and boost to achieve full stand. Increased time to gain/maintain standing balance.    Ambulation/Gait Ambulation/Gait assistance: Min assist, +2 physical assistance, +2 safety/equipment, Mod assist Gait Distance (Feet): 15 Feet (x2) Assistive device: Rolling walker (2 wheels) Gait Pattern/deviations: Step-through pattern, Decreased stride length, Trunk flexed Gait velocity: Decreased Gait velocity interpretation: <1.31 ft/sec, indicative of household ambulator   General Gait Details: Pt ambulated into the bathroom to sit on toilet. Incontinence of bowel and pt sat for gown change, sock change, and LE hygiene. Pt stood for remainder of hygiene. Pt then ambulated to the sink and stood to wash hands, and then back to the recliner in room. Pt with increased posterior lean in standing at sink, requiring heavier assist to correct. Difficulty making corrective changes.   Stairs             Wheelchair Mobility     Tilt Bed    Modified Rankin (Stroke Patients Only)       Balance Overall balance assessment: Needs assistance Sitting-balance support: Feet supported, Feet unsupported Sitting balance-Leahy Scale: Poor Sitting balance - Comments: R lateral lean and pt tending to hold feet out requiring cues   Standing balance support: During functional activity, Bilateral  upper extremity supported, Reliant on assistive device for balance Standing  balance-Leahy Scale: Poor Standing balance comment: reliant on RW and external support due to posterior bias                            Communication Communication Communication: Impaired Factors Affecting Communication: Reduced clarity of speech  Cognition Arousal: Alert Behavior During Therapy: Flat affect   PT - Cognitive impairments: Orientation, Awareness, Memory, Attention, Sequencing, Problem solving, Safety/Judgement   Orientation impairments: Place, Time                     Following commands: Impaired Following commands impaired: Follows one step commands inconsistently, Follows one step commands with increased time    Cueing Cueing Techniques: Verbal cues  Exercises      General Comments General comments (skin integrity, edema, etc.): VSS      Pertinent Vitals/Pain Pain Assessment Pain Assessment: Faces Faces Pain Scale: Hurts a little bit Pain Location: RUE Pain Descriptors / Indicators: Discomfort, Grimacing, Guarding Pain Intervention(s): Limited activity within patient's tolerance, Monitored during session, Repositioned    Home Living                          Prior Function            PT Goals (current goals can now be found in the care plan section) Acute Rehab PT Goals Patient Stated Goal: Not stated PT Goal Formulation: Patient unable to participate in goal setting Time For Goal Achievement: 09/25/24 Potential to Achieve Goals: Fair Progress towards PT goals: Progressing toward goals    Frequency    Min 2X/week      PT Plan      Co-evaluation PT/OT/SLP Co-Evaluation/Treatment: Yes Reason for Co-Treatment: Complexity of the patient's impairments (multi-system involvement);Necessary to address cognition/behavior during functional activity;For patient/therapist safety;To address functional/ADL transfers PT goals addressed during session: Mobility/safety with mobility;Balance;Proper use of  DME;Strengthening/ROM OT goals addressed during session: ADL's and self-care      AM-PAC PT 6 Clicks Mobility   Outcome Measure  Help needed turning from your back to your side while in a flat bed without using bedrails?: Total Help needed moving from lying on your back to sitting on the side of a flat bed without using bedrails?: Total Help needed moving to and from a bed to a chair (including a wheelchair)?: Total Help needed standing up from a chair using your arms (e.g., wheelchair or bedside chair)?: Total Help needed to walk in hospital room?: Total Help needed climbing 3-5 steps with a railing? : Total 6 Click Score: 6    End of Session Equipment Utilized During Treatment: Gait belt Activity Tolerance: Patient tolerated treatment well Patient left: in chair;with call bell/phone within reach;with chair alarm set Nurse Communication: Mobility status PT Visit Diagnosis: Unsteadiness on feet (R26.81);Pain Pain - Right/Left: Right Pain - part of body: Shoulder     Time: 9091-9052 PT Time Calculation (min) (ACUTE ONLY): 39 min  Charges:    $Gait Training: 23-37 mins PT General Charges $$ ACUTE PT VISIT: 1 Visit                     Leita Sable, PT, DPT Acute Rehabilitation Services Secure Chat Preferred Office: [phone]    Leita JONETTA Sable 09/14/2024, 10:38 AM

## 2024-09-16 ENCOUNTER — Encounter (HOSPITAL_COMMUNITY): Payer: Self-pay | Admitting: Orthopaedic Surgery

## 2024-09-18 DIAGNOSIS — Z96611 Presence of right artificial shoulder joint: Secondary | ICD-10-CM | POA: Diagnosis not present

## 2024-09-30 ENCOUNTER — Encounter (HOSPITAL_COMMUNITY): Payer: Self-pay | Admitting: Orthopaedic Surgery

## 2024-09-30 DIAGNOSIS — I5022 Chronic systolic (congestive) heart failure: Secondary | ICD-10-CM | POA: Diagnosis not present

## 2024-09-30 DIAGNOSIS — R531 Weakness: Secondary | ICD-10-CM | POA: Diagnosis not present

## 2024-09-30 DIAGNOSIS — S42221A 2-part displaced fracture of surgical neck of right humerus, initial encounter for closed fracture: Secondary | ICD-10-CM | POA: Diagnosis not present

## 2024-09-30 DIAGNOSIS — I2510 Atherosclerotic heart disease of native coronary artery without angina pectoris: Secondary | ICD-10-CM | POA: Diagnosis not present

## 2024-09-30 DIAGNOSIS — K5909 Other constipation: Secondary | ICD-10-CM | POA: Diagnosis not present

## 2024-09-30 DIAGNOSIS — K219 Gastro-esophageal reflux disease without esophagitis: Secondary | ICD-10-CM | POA: Diagnosis not present

## 2024-09-30 DIAGNOSIS — E7849 Other hyperlipidemia: Secondary | ICD-10-CM | POA: Diagnosis not present

## 2024-10-06 DIAGNOSIS — Z96611 Presence of right artificial shoulder joint: Secondary | ICD-10-CM | POA: Diagnosis not present

## 2024-10-07 DIAGNOSIS — R630 Anorexia: Secondary | ICD-10-CM | POA: Diagnosis not present

## 2024-10-07 DIAGNOSIS — R0602 Shortness of breath: Secondary | ICD-10-CM | POA: Diagnosis not present

## 2024-10-07 DIAGNOSIS — R2689 Other abnormalities of gait and mobility: Secondary | ICD-10-CM | POA: Diagnosis not present

## 2024-10-07 DIAGNOSIS — R296 Repeated falls: Secondary | ICD-10-CM | POA: Diagnosis not present

## 2024-10-07 DIAGNOSIS — R5383 Other fatigue: Secondary | ICD-10-CM | POA: Diagnosis not present

## 2024-10-07 DIAGNOSIS — R634 Abnormal weight loss: Secondary | ICD-10-CM | POA: Diagnosis not present

## 2024-10-07 DIAGNOSIS — Z515 Encounter for palliative care: Secondary | ICD-10-CM | POA: Diagnosis not present

## 2024-10-07 DIAGNOSIS — M6281 Muscle weakness (generalized): Secondary | ICD-10-CM | POA: Diagnosis not present

## 2024-10-23 ENCOUNTER — Ambulatory Visit

## 2024-10-23 DIAGNOSIS — I442 Atrioventricular block, complete: Secondary | ICD-10-CM | POA: Diagnosis not present

## 2024-10-24 LAB — CUP PACEART REMOTE DEVICE CHECK
Battery Remaining Longevity: 68 mo
Battery Voltage: 3.02 V
Brady Statistic AP VP Percent: 0 %
Brady Statistic AP VS Percent: 0 %
Brady Statistic AS VP Percent: 98.59 %
Brady Statistic AS VS Percent: 1.41 %
Brady Statistic RA Percent Paced: 0 %
Brady Statistic RV Percent Paced: 98.59 %
Date Time Interrogation Session: 20251219023112
Implantable Lead Connection Status: 753985
Implantable Lead Connection Status: 753985
Implantable Lead Connection Status: 753985
Implantable Lead Implant Date: 20200217
Implantable Lead Implant Date: 20200217
Implantable Lead Implant Date: 20200217
Implantable Lead Location: 753858
Implantable Lead Location: 753859
Implantable Lead Location: 753860
Implantable Lead Model: 4398
Implantable Lead Model: 5076
Implantable Lead Model: 5076
Implantable Pulse Generator Implant Date: 20250619
Lead Channel Impedance Value: 266 Ohm
Lead Channel Impedance Value: 285 Ohm
Lead Channel Impedance Value: 304 Ohm
Lead Channel Impedance Value: 323 Ohm
Lead Channel Impedance Value: 456 Ohm
Lead Channel Impedance Value: 456 Ohm
Lead Channel Impedance Value: 456 Ohm
Lead Channel Impedance Value: 494 Ohm
Lead Channel Impedance Value: 589 Ohm
Lead Channel Impedance Value: 646 Ohm
Lead Channel Impedance Value: 646 Ohm
Lead Channel Impedance Value: 703 Ohm
Lead Channel Impedance Value: 836 Ohm
Lead Channel Impedance Value: 855 Ohm
Lead Channel Pacing Threshold Amplitude: 1.625 V
Lead Channel Pacing Threshold Amplitude: 2.25 V
Lead Channel Pacing Threshold Pulse Width: 0.4 ms
Lead Channel Pacing Threshold Pulse Width: 1 ms
Lead Channel Sensing Intrinsic Amplitude: 0.625 mV
Lead Channel Sensing Intrinsic Amplitude: 14.125 mV
Lead Channel Sensing Intrinsic Amplitude: 14.125 mV
Lead Channel Setting Pacing Amplitude: 2.25 V
Lead Channel Setting Pacing Amplitude: 3 V
Lead Channel Setting Pacing Pulse Width: 0.8 ms
Lead Channel Setting Pacing Pulse Width: 1 ms
Lead Channel Setting Sensing Sensitivity: 1.2 mV
Zone Setting Status: 755011

## 2024-10-26 NOTE — Progress Notes (Signed)
 Remote PPM Transmission

## 2024-11-01 ENCOUNTER — Ambulatory Visit: Payer: Self-pay | Admitting: Internal Medicine

## 2025-01-22 ENCOUNTER — Encounter

## 2025-04-23 ENCOUNTER — Encounter

## 2025-07-23 ENCOUNTER — Encounter

## 2025-10-22 ENCOUNTER — Encounter

## 2026-01-21 ENCOUNTER — Encounter

## 2026-04-22 ENCOUNTER — Encounter
